# Patient Record
Sex: Female | Born: 1955 | Hispanic: No | Marital: Single | Smoking: Never smoker
Health system: Southern US, Community
[De-identification: ages and names within clinical notes are randomized; demographics above are authoritative.]

## PROBLEM LIST (undated history)

## (undated) ENCOUNTER — Ambulatory Visit (HOSPITAL_COMMUNITY): Admission: EM | Payer: Medicare Other

## (undated) DIAGNOSIS — E789 Disorder of lipoprotein metabolism, unspecified: Secondary | ICD-10-CM

## (undated) DIAGNOSIS — F0280 Dementia in other diseases classified elsewhere without behavioral disturbance: Secondary | ICD-10-CM

## (undated) DIAGNOSIS — G309 Alzheimer's disease, unspecified: Secondary | ICD-10-CM

## (undated) DIAGNOSIS — M545 Low back pain, unspecified: Secondary | ICD-10-CM

## (undated) DIAGNOSIS — R609 Edema, unspecified: Secondary | ICD-10-CM

## (undated) DIAGNOSIS — M4120 Other idiopathic scoliosis, site unspecified: Secondary | ICD-10-CM

## (undated) DIAGNOSIS — R5383 Other fatigue: Secondary | ICD-10-CM

## (undated) DIAGNOSIS — E559 Vitamin D deficiency, unspecified: Secondary | ICD-10-CM

## (undated) DIAGNOSIS — R32 Unspecified urinary incontinence: Secondary | ICD-10-CM

## (undated) DIAGNOSIS — J40 Bronchitis, not specified as acute or chronic: Secondary | ICD-10-CM

## (undated) DIAGNOSIS — M542 Cervicalgia: Secondary | ICD-10-CM

## (undated) DIAGNOSIS — J45909 Unspecified asthma, uncomplicated: Secondary | ICD-10-CM

## (undated) DIAGNOSIS — R42 Dizziness and giddiness: Secondary | ICD-10-CM

## (undated) DIAGNOSIS — I8390 Asymptomatic varicose veins of unspecified lower extremity: Secondary | ICD-10-CM

## (undated) DIAGNOSIS — R5381 Other malaise: Secondary | ICD-10-CM

## (undated) DIAGNOSIS — R159 Full incontinence of feces: Secondary | ICD-10-CM

## (undated) DIAGNOSIS — K219 Gastro-esophageal reflux disease without esophagitis: Secondary | ICD-10-CM

## (undated) DIAGNOSIS — M129 Arthropathy, unspecified: Secondary | ICD-10-CM

## (undated) DIAGNOSIS — M25519 Pain in unspecified shoulder: Secondary | ICD-10-CM

## (undated) DIAGNOSIS — M5416 Radiculopathy, lumbar region: Secondary | ICD-10-CM

## (undated) DIAGNOSIS — G4733 Obstructive sleep apnea (adult) (pediatric): Secondary | ICD-10-CM

## (undated) DIAGNOSIS — J309 Allergic rhinitis, unspecified: Secondary | ICD-10-CM

## (undated) HISTORY — DX: Edema, unspecified: R60.9

## (undated) HISTORY — DX: Unspecified urinary incontinence: R32

## (undated) HISTORY — DX: Vitamin D deficiency, unspecified: E55.9

## (undated) HISTORY — PX: NECK SURGERY: SHX720

## (undated) HISTORY — DX: Other malaise: R53.81

## (undated) HISTORY — DX: Bronchitis, not specified as acute or chronic: J40

## (undated) HISTORY — DX: Unspecified asthma, uncomplicated: J45.909

## (undated) HISTORY — DX: Gastro-esophageal reflux disease without esophagitis: K21.9

## (undated) HISTORY — DX: Other fatigue: R53.83

## (undated) HISTORY — DX: Radiculopathy, lumbar region: M54.16

## (undated) HISTORY — DX: Alzheimer's disease, unspecified: G30.9

## (undated) HISTORY — DX: Dementia in other diseases classified elsewhere, unspecified severity, without behavioral disturbance, psychotic disturbance, mood disturbance, and anxiety: F02.80

## (undated) HISTORY — DX: Arthropathy, unspecified: M12.9

## (undated) HISTORY — DX: Other idiopathic scoliosis, site unspecified: M41.20

## (undated) HISTORY — DX: Disorder of lipoprotein metabolism, unspecified: E78.9

## (undated) HISTORY — DX: Asymptomatic varicose veins of unspecified lower extremity: I83.90

## (undated) HISTORY — DX: Full incontinence of feces: R15.9

## (undated) HISTORY — DX: Obstructive sleep apnea (adult) (pediatric): G47.33

## (undated) HISTORY — DX: Pain in unspecified shoulder: M25.519

## (undated) HISTORY — DX: Low back pain: M54.5

## (undated) HISTORY — DX: Low back pain, unspecified: M54.50

## (undated) HISTORY — DX: Dizziness and giddiness: R42

## (undated) HISTORY — DX: Allergic rhinitis, unspecified: J30.9

## (undated) HISTORY — DX: Cervicalgia: M54.2

---

## 2007-05-14 ENCOUNTER — Emergency Department (HOSPITAL_COMMUNITY): Admission: EM | Admit: 2007-05-14 | Discharge: 2007-05-14 | Payer: Self-pay | Admitting: Family Medicine

## 2007-06-09 ENCOUNTER — Emergency Department (HOSPITAL_COMMUNITY): Admission: EM | Admit: 2007-06-09 | Discharge: 2007-06-09 | Payer: Self-pay | Admitting: Emergency Medicine

## 2007-07-17 ENCOUNTER — Encounter: Admission: RE | Admit: 2007-07-17 | Discharge: 2007-07-17 | Payer: Self-pay | Admitting: Family Medicine

## 2007-07-28 ENCOUNTER — Ambulatory Visit: Payer: Self-pay | Admitting: Nurse Practitioner

## 2007-07-28 DIAGNOSIS — M545 Low back pain, unspecified: Secondary | ICD-10-CM | POA: Insufficient documentation

## 2007-07-28 DIAGNOSIS — I8390 Asymptomatic varicose veins of unspecified lower extremity: Secondary | ICD-10-CM | POA: Insufficient documentation

## 2007-07-28 DIAGNOSIS — N8111 Cystocele, midline: Secondary | ICD-10-CM | POA: Insufficient documentation

## 2007-07-28 DIAGNOSIS — K219 Gastro-esophageal reflux disease without esophagitis: Secondary | ICD-10-CM | POA: Insufficient documentation

## 2007-07-28 LAB — CONVERTED CEMR LAB
Bilirubin Urine: NEGATIVE
Glucose, Urine, Semiquant: NEGATIVE
Ketones, urine, test strip: NEGATIVE
Nitrite: NEGATIVE
Protein, U semiquant: NEGATIVE
Specific Gravity, Urine: >=1.03
Urobilinogen, UA: 0.2
WBC Urine, dipstick: NEGATIVE
pH: 5

## 2007-08-05 ENCOUNTER — Encounter (INDEPENDENT_AMBULATORY_CARE_PROVIDER_SITE_OTHER): Payer: Self-pay | Admitting: Nurse Practitioner

## 2007-08-13 ENCOUNTER — Encounter (INDEPENDENT_AMBULATORY_CARE_PROVIDER_SITE_OTHER): Payer: Self-pay | Admitting: Nurse Practitioner

## 2007-08-15 ENCOUNTER — Encounter (INDEPENDENT_AMBULATORY_CARE_PROVIDER_SITE_OTHER): Payer: Self-pay | Admitting: Nurse Practitioner

## 2007-08-18 ENCOUNTER — Ambulatory Visit (HOSPITAL_COMMUNITY): Admission: RE | Admit: 2007-08-18 | Discharge: 2007-08-18 | Payer: Self-pay | Admitting: Nurse Practitioner

## 2007-08-18 ENCOUNTER — Ambulatory Visit: Payer: Self-pay | Admitting: Nurse Practitioner

## 2007-08-18 DIAGNOSIS — M4120 Other idiopathic scoliosis, site unspecified: Secondary | ICD-10-CM | POA: Insufficient documentation

## 2007-08-18 DIAGNOSIS — N951 Menopausal and female climacteric states: Secondary | ICD-10-CM | POA: Insufficient documentation

## 2007-08-18 LAB — CONVERTED CEMR LAB
ALT: 17 units/L (ref 0–35)
AST: 16 units/L (ref 0–37)
Albumin: 4.2 g/dL (ref 3.5–5.2)
Alkaline Phosphatase: 58 units/L (ref 39–117)
BUN: 11 mg/dL (ref 6–23)
Basophils Absolute: 0 10*3/uL (ref 0.0–0.1)
Basophils Relative: 1 % (ref 0–1)
Bilirubin Urine: NEGATIVE
CO2: 28 meq/L (ref 19–32)
Calcium: 9.6 mg/dL (ref 8.4–10.5)
Chloride: 102 meq/L (ref 96–112)
Creatinine, Ser: 0.58 mg/dL (ref 0.40–1.20)
Eosinophils Absolute: 0.1 10*3/uL (ref 0.0–0.7)
Eosinophils Relative: 3 % (ref 0–5)
Glucose, Bld: 76 mg/dL (ref 70–99)
Glucose, Urine, Semiquant: NEGATIVE
HCT: 39.2 % (ref 36.0–46.0)
Hemoglobin: 12.8 g/dL (ref 12.0–15.0)
Ketones, urine, test strip: NEGATIVE
Lymphocytes Relative: 39 % (ref 12–46)
Lymphs Abs: 1.8 10*3/uL (ref 0.7–4.0)
MCHC: 32.7 g/dL (ref 30.0–36.0)
MCV: 89.3 fL (ref 78.0–100.0)
Monocytes Absolute: 0.4 10*3/uL (ref 0.1–1.0)
Monocytes Relative: 9 % (ref 3–12)
Neutro Abs: 2.2 10*3/uL (ref 1.7–7.7)
Neutrophils Relative %: 48 % (ref 43–77)
Nitrite: NEGATIVE
Platelets: 282 10*3/uL (ref 150–400)
Potassium: 4.3 meq/L (ref 3.5–5.3)
Protein, U semiquant: NEGATIVE
RBC: 4.39 M/uL (ref 3.87–5.11)
RDW: 13.3 % (ref 11.5–15.5)
Sodium: 141 meq/L (ref 135–145)
Specific Gravity, Urine: 1.02
TSH: 2.188 microintl units/mL (ref 0.350–4.50)
Total Bilirubin: 0.3 mg/dL (ref 0.3–1.2)
Total Protein: 7.4 g/dL (ref 6.0–8.3)
Urobilinogen, UA: 0.2
WBC Urine, dipstick: NEGATIVE
WBC: 4.6 10*3/uL (ref 4.0–10.5)
pH: 5.5

## 2007-08-19 ENCOUNTER — Encounter (INDEPENDENT_AMBULATORY_CARE_PROVIDER_SITE_OTHER): Payer: Self-pay | Admitting: Nurse Practitioner

## 2007-08-21 ENCOUNTER — Ambulatory Visit (HOSPITAL_COMMUNITY): Admission: RE | Admit: 2007-08-21 | Discharge: 2007-08-21 | Payer: Self-pay | Admitting: Family Medicine

## 2007-09-01 ENCOUNTER — Ambulatory Visit: Payer: Self-pay | Admitting: Nurse Practitioner

## 2007-09-01 DIAGNOSIS — M129 Arthropathy, unspecified: Secondary | ICD-10-CM | POA: Insufficient documentation

## 2007-09-09 ENCOUNTER — Encounter (INDEPENDENT_AMBULATORY_CARE_PROVIDER_SITE_OTHER): Payer: Self-pay | Admitting: Nurse Practitioner

## 2007-09-16 ENCOUNTER — Encounter (INDEPENDENT_AMBULATORY_CARE_PROVIDER_SITE_OTHER): Payer: Self-pay | Admitting: Nurse Practitioner

## 2007-09-16 ENCOUNTER — Ambulatory Visit (HOSPITAL_COMMUNITY): Admission: RE | Admit: 2007-09-16 | Discharge: 2007-09-16 | Payer: Self-pay | Admitting: Urology

## 2007-09-23 ENCOUNTER — Telehealth (INDEPENDENT_AMBULATORY_CARE_PROVIDER_SITE_OTHER): Payer: Self-pay | Admitting: Nurse Practitioner

## 2007-09-25 ENCOUNTER — Encounter (INDEPENDENT_AMBULATORY_CARE_PROVIDER_SITE_OTHER): Payer: Self-pay | Admitting: Nurse Practitioner

## 2007-09-25 ENCOUNTER — Encounter: Admission: RE | Admit: 2007-09-25 | Discharge: 2007-10-30 | Payer: Self-pay | Admitting: Nurse Practitioner

## 2007-10-01 ENCOUNTER — Ambulatory Visit: Payer: Self-pay | Admitting: Nurse Practitioner

## 2007-10-01 DIAGNOSIS — J45909 Unspecified asthma, uncomplicated: Secondary | ICD-10-CM | POA: Insufficient documentation

## 2007-10-01 DIAGNOSIS — R109 Unspecified abdominal pain: Secondary | ICD-10-CM | POA: Insufficient documentation

## 2007-10-02 ENCOUNTER — Telehealth (INDEPENDENT_AMBULATORY_CARE_PROVIDER_SITE_OTHER): Payer: Self-pay | Admitting: Nurse Practitioner

## 2007-10-09 ENCOUNTER — Encounter (INDEPENDENT_AMBULATORY_CARE_PROVIDER_SITE_OTHER): Payer: Self-pay | Admitting: Nurse Practitioner

## 2007-10-09 ENCOUNTER — Ambulatory Visit (HOSPITAL_COMMUNITY): Admission: RE | Admit: 2007-10-09 | Discharge: 2007-10-09 | Payer: Self-pay | Admitting: Internal Medicine

## 2007-10-13 ENCOUNTER — Telehealth (INDEPENDENT_AMBULATORY_CARE_PROVIDER_SITE_OTHER): Payer: Self-pay | Admitting: Nurse Practitioner

## 2007-10-14 ENCOUNTER — Encounter (INDEPENDENT_AMBULATORY_CARE_PROVIDER_SITE_OTHER): Payer: Self-pay | Admitting: Nurse Practitioner

## 2007-10-27 ENCOUNTER — Ambulatory Visit: Payer: Self-pay | Admitting: Nurse Practitioner

## 2007-10-27 DIAGNOSIS — M542 Cervicalgia: Secondary | ICD-10-CM | POA: Insufficient documentation

## 2007-10-28 ENCOUNTER — Telehealth (INDEPENDENT_AMBULATORY_CARE_PROVIDER_SITE_OTHER): Payer: Self-pay | Admitting: Nurse Practitioner

## 2007-11-04 ENCOUNTER — Encounter (INDEPENDENT_AMBULATORY_CARE_PROVIDER_SITE_OTHER): Payer: Self-pay | Admitting: Nurse Practitioner

## 2008-04-01 ENCOUNTER — Encounter: Admission: RE | Admit: 2008-04-01 | Discharge: 2008-04-01 | Payer: Self-pay | Admitting: Pulmonary Disease

## 2008-04-09 ENCOUNTER — Telehealth (INDEPENDENT_AMBULATORY_CARE_PROVIDER_SITE_OTHER): Payer: Self-pay | Admitting: Nurse Practitioner

## 2008-04-12 ENCOUNTER — Encounter (INDEPENDENT_AMBULATORY_CARE_PROVIDER_SITE_OTHER): Payer: Self-pay | Admitting: Nurse Practitioner

## 2008-04-22 ENCOUNTER — Ambulatory Visit: Payer: Self-pay | Admitting: Nurse Practitioner

## 2008-04-22 DIAGNOSIS — M25519 Pain in unspecified shoulder: Secondary | ICD-10-CM | POA: Insufficient documentation

## 2008-04-22 LAB — CONVERTED CEMR LAB
Basophils Absolute: 0 10*3/uL (ref 0.0–0.1)
Basophils Relative: 0 % (ref 0–1)
Eosinophils Absolute: 0.1 10*3/uL (ref 0.0–0.7)
Eosinophils Relative: 1 % (ref 0–5)
HCT: 39.4 % (ref 36.0–46.0)
Hemoglobin: 12.9 g/dL (ref 12.0–15.0)
Lymphocytes Relative: 44 % (ref 12–46)
Lymphs Abs: 3.3 10*3/uL (ref 0.7–4.0)
MCHC: 32.7 g/dL (ref 30.0–36.0)
MCV: 87.4 fL (ref 78.0–100.0)
Monocytes Absolute: 0.7 10*3/uL (ref 0.1–1.0)
Monocytes Relative: 9 % (ref 3–12)
Neutro Abs: 3.3 10*3/uL (ref 1.7–7.7)
Neutrophils Relative %: 45 % (ref 43–77)
Platelets: 280 10*3/uL (ref 150–400)
RBC: 4.51 M/uL (ref 3.87–5.11)
RDW: 13.3 % (ref 11.5–15.5)
WBC: 7.4 10*3/uL (ref 4.0–10.5)

## 2008-04-26 ENCOUNTER — Ambulatory Visit (HOSPITAL_COMMUNITY): Admission: RE | Admit: 2008-04-26 | Discharge: 2008-04-26 | Payer: Self-pay | Admitting: Nurse Practitioner

## 2008-04-28 ENCOUNTER — Encounter (INDEPENDENT_AMBULATORY_CARE_PROVIDER_SITE_OTHER): Payer: Self-pay | Admitting: Nurse Practitioner

## 2008-05-03 ENCOUNTER — Telehealth (INDEPENDENT_AMBULATORY_CARE_PROVIDER_SITE_OTHER): Payer: Self-pay | Admitting: Nurse Practitioner

## 2008-05-10 ENCOUNTER — Ambulatory Visit: Payer: Self-pay | Admitting: Nurse Practitioner

## 2008-05-10 DIAGNOSIS — J309 Allergic rhinitis, unspecified: Secondary | ICD-10-CM | POA: Insufficient documentation

## 2008-06-23 ENCOUNTER — Ambulatory Visit: Payer: Self-pay | Admitting: Nurse Practitioner

## 2008-06-23 DIAGNOSIS — R609 Edema, unspecified: Secondary | ICD-10-CM | POA: Insufficient documentation

## 2008-06-23 DIAGNOSIS — R5383 Other fatigue: Secondary | ICD-10-CM

## 2008-06-23 DIAGNOSIS — R5381 Other malaise: Secondary | ICD-10-CM | POA: Insufficient documentation

## 2008-06-23 LAB — CONVERTED CEMR LAB
Blood Glucose, Fingerstick: 85
Hgb A1c MFr Bld: 85 %

## 2008-06-24 ENCOUNTER — Encounter (INDEPENDENT_AMBULATORY_CARE_PROVIDER_SITE_OTHER): Payer: Self-pay | Admitting: Nurse Practitioner

## 2008-06-25 DIAGNOSIS — E559 Vitamin D deficiency, unspecified: Secondary | ICD-10-CM | POA: Insufficient documentation

## 2008-06-25 LAB — CONVERTED CEMR LAB
BUN: 20 mg/dL (ref 6–23)
CO2: 26 meq/L (ref 19–32)
Calcium: 9.4 mg/dL (ref 8.4–10.5)
Chloride: 109 meq/L (ref 96–112)
Creatinine, Ser: 0.56 mg/dL (ref 0.40–1.20)
Eosinophils Absolute: 0.1 10*3/uL (ref 0.0–0.7)
Eosinophils Relative: 2 % (ref 0–5)
HCT: 39.2 % (ref 36.0–46.0)
Lymphs Abs: 2 10*3/uL (ref 0.7–4.0)
MCV: 91.2 fL (ref 78.0–100.0)
Monocytes Relative: 9 % (ref 3–12)
Platelets: 268 10*3/uL (ref 150–400)
TSH: 1.864 microintl units/mL (ref 0.350–4.500)
WBC: 6.6 10*3/uL (ref 4.0–10.5)

## 2008-07-08 ENCOUNTER — Encounter (INDEPENDENT_AMBULATORY_CARE_PROVIDER_SITE_OTHER): Payer: Self-pay | Admitting: Nurse Practitioner

## 2008-07-28 ENCOUNTER — Encounter (INDEPENDENT_AMBULATORY_CARE_PROVIDER_SITE_OTHER): Payer: Self-pay | Admitting: Nurse Practitioner

## 2008-07-28 ENCOUNTER — Ambulatory Visit (HOSPITAL_BASED_OUTPATIENT_CLINIC_OR_DEPARTMENT_OTHER): Admission: RE | Admit: 2008-07-28 | Discharge: 2008-07-28 | Payer: Self-pay | Admitting: Nurse Practitioner

## 2008-07-28 DIAGNOSIS — G4733 Obstructive sleep apnea (adult) (pediatric): Secondary | ICD-10-CM | POA: Insufficient documentation

## 2008-08-04 ENCOUNTER — Ambulatory Visit: Payer: Self-pay | Admitting: Pulmonary Disease

## 2008-08-12 ENCOUNTER — Encounter (INDEPENDENT_AMBULATORY_CARE_PROVIDER_SITE_OTHER): Payer: Self-pay | Admitting: Nurse Practitioner

## 2008-08-23 ENCOUNTER — Encounter: Admission: RE | Admit: 2008-08-23 | Discharge: 2008-08-23 | Payer: Self-pay | Admitting: Internal Medicine

## 2008-08-25 ENCOUNTER — Encounter (INDEPENDENT_AMBULATORY_CARE_PROVIDER_SITE_OTHER): Payer: Self-pay | Admitting: Nurse Practitioner

## 2008-08-26 ENCOUNTER — Encounter: Admission: RE | Admit: 2008-08-26 | Discharge: 2008-08-26 | Payer: Self-pay | Admitting: Internal Medicine

## 2008-10-07 ENCOUNTER — Ambulatory Visit: Payer: Self-pay | Admitting: Nurse Practitioner

## 2008-10-07 LAB — CONVERTED CEMR LAB

## 2008-10-18 ENCOUNTER — Ambulatory Visit: Payer: Self-pay | Admitting: Nurse Practitioner

## 2008-10-19 LAB — CONVERTED CEMR LAB: Vit D, 25-Hydroxy: 22 ng/mL — ABNORMAL LOW (ref 30–89)

## 2008-10-29 ENCOUNTER — Ambulatory Visit: Payer: Self-pay | Admitting: Nurse Practitioner

## 2008-11-03 ENCOUNTER — Ambulatory Visit: Payer: Self-pay | Admitting: Pulmonary Disease

## 2008-11-03 DIAGNOSIS — R0600 Dyspnea, unspecified: Secondary | ICD-10-CM | POA: Insufficient documentation

## 2008-11-03 DIAGNOSIS — R0609 Other forms of dyspnea: Secondary | ICD-10-CM | POA: Insufficient documentation

## 2008-11-08 ENCOUNTER — Ambulatory Visit: Payer: Self-pay | Admitting: Pulmonary Disease

## 2008-11-10 ENCOUNTER — Telehealth (INDEPENDENT_AMBULATORY_CARE_PROVIDER_SITE_OTHER): Payer: Self-pay | Admitting: Nurse Practitioner

## 2008-11-10 ENCOUNTER — Encounter: Admission: RE | Admit: 2008-11-10 | Discharge: 2008-12-16 | Payer: Self-pay | Admitting: Nurse Practitioner

## 2008-11-10 ENCOUNTER — Encounter (INDEPENDENT_AMBULATORY_CARE_PROVIDER_SITE_OTHER): Payer: Self-pay | Admitting: Nurse Practitioner

## 2008-12-07 ENCOUNTER — Encounter (INDEPENDENT_AMBULATORY_CARE_PROVIDER_SITE_OTHER): Payer: Self-pay | Admitting: Nurse Practitioner

## 2008-12-08 ENCOUNTER — Encounter (INDEPENDENT_AMBULATORY_CARE_PROVIDER_SITE_OTHER): Payer: Self-pay | Admitting: Nurse Practitioner

## 2008-12-13 ENCOUNTER — Encounter (INDEPENDENT_AMBULATORY_CARE_PROVIDER_SITE_OTHER): Payer: Self-pay | Admitting: Nurse Practitioner

## 2008-12-20 ENCOUNTER — Ambulatory Visit: Payer: Self-pay | Admitting: Nurse Practitioner

## 2008-12-20 DIAGNOSIS — J329 Chronic sinusitis, unspecified: Secondary | ICD-10-CM | POA: Insufficient documentation

## 2008-12-21 ENCOUNTER — Encounter (INDEPENDENT_AMBULATORY_CARE_PROVIDER_SITE_OTHER): Payer: Self-pay | Admitting: Nurse Practitioner

## 2008-12-31 ENCOUNTER — Encounter (INDEPENDENT_AMBULATORY_CARE_PROVIDER_SITE_OTHER): Payer: Self-pay | Admitting: Nurse Practitioner

## 2009-01-14 ENCOUNTER — Encounter (INDEPENDENT_AMBULATORY_CARE_PROVIDER_SITE_OTHER): Payer: Self-pay | Admitting: Nurse Practitioner

## 2009-01-25 ENCOUNTER — Encounter (INDEPENDENT_AMBULATORY_CARE_PROVIDER_SITE_OTHER): Payer: Self-pay | Admitting: Nurse Practitioner

## 2009-02-01 ENCOUNTER — Encounter (INDEPENDENT_AMBULATORY_CARE_PROVIDER_SITE_OTHER): Payer: Self-pay | Admitting: Nurse Practitioner

## 2009-02-01 ENCOUNTER — Encounter: Admission: RE | Admit: 2009-02-01 | Discharge: 2009-02-01 | Payer: Self-pay | Admitting: Internal Medicine

## 2009-02-03 ENCOUNTER — Ambulatory Visit: Payer: Self-pay | Admitting: Nurse Practitioner

## 2009-02-03 DIAGNOSIS — H1045 Other chronic allergic conjunctivitis: Secondary | ICD-10-CM | POA: Insufficient documentation

## 2009-02-24 ENCOUNTER — Ambulatory Visit: Payer: Self-pay | Admitting: Nurse Practitioner

## 2009-03-17 ENCOUNTER — Ambulatory Visit: Payer: Self-pay | Admitting: Nurse Practitioner

## 2009-03-17 LAB — CONVERTED CEMR LAB
Nitrite: NEGATIVE
Specific Gravity, Urine: 1.025
pH: 5.5

## 2009-03-18 ENCOUNTER — Telehealth (INDEPENDENT_AMBULATORY_CARE_PROVIDER_SITE_OTHER): Payer: Self-pay | Admitting: Nurse Practitioner

## 2009-03-22 ENCOUNTER — Encounter (INDEPENDENT_AMBULATORY_CARE_PROVIDER_SITE_OTHER): Payer: Self-pay | Admitting: Nurse Practitioner

## 2009-04-14 ENCOUNTER — Encounter (INDEPENDENT_AMBULATORY_CARE_PROVIDER_SITE_OTHER): Payer: Self-pay | Admitting: Nurse Practitioner

## 2009-04-28 ENCOUNTER — Encounter (INDEPENDENT_AMBULATORY_CARE_PROVIDER_SITE_OTHER): Payer: Self-pay | Admitting: Nurse Practitioner

## 2009-05-06 ENCOUNTER — Telehealth (INDEPENDENT_AMBULATORY_CARE_PROVIDER_SITE_OTHER): Payer: Self-pay | Admitting: Nurse Practitioner

## 2009-05-06 ENCOUNTER — Ambulatory Visit: Payer: Self-pay | Admitting: Nurse Practitioner

## 2009-05-06 DIAGNOSIS — R42 Dizziness and giddiness: Secondary | ICD-10-CM | POA: Insufficient documentation

## 2009-06-20 ENCOUNTER — Encounter (INDEPENDENT_AMBULATORY_CARE_PROVIDER_SITE_OTHER): Payer: Self-pay | Admitting: Nurse Practitioner

## 2009-07-28 ENCOUNTER — Encounter (INDEPENDENT_AMBULATORY_CARE_PROVIDER_SITE_OTHER): Payer: Self-pay | Admitting: Nurse Practitioner

## 2009-08-16 ENCOUNTER — Encounter: Admission: RE | Admit: 2009-08-16 | Discharge: 2009-08-16 | Payer: Self-pay | Admitting: Neurology

## 2009-08-18 ENCOUNTER — Encounter (INDEPENDENT_AMBULATORY_CARE_PROVIDER_SITE_OTHER): Payer: Self-pay | Admitting: Nurse Practitioner

## 2009-08-23 ENCOUNTER — Encounter: Admission: RE | Admit: 2009-08-23 | Discharge: 2009-08-23 | Payer: Self-pay | Admitting: Internal Medicine

## 2009-09-23 ENCOUNTER — Encounter (INDEPENDENT_AMBULATORY_CARE_PROVIDER_SITE_OTHER): Payer: Self-pay | Admitting: Nurse Practitioner

## 2009-09-28 ENCOUNTER — Encounter: Admission: RE | Admit: 2009-09-28 | Discharge: 2009-11-09 | Payer: Self-pay | Admitting: Orthopedic Surgery

## 2009-11-07 ENCOUNTER — Encounter: Admission: RE | Admit: 2009-11-07 | Discharge: 2009-11-07 | Payer: Self-pay | Admitting: Neurology

## 2009-12-09 ENCOUNTER — Encounter (INDEPENDENT_AMBULATORY_CARE_PROVIDER_SITE_OTHER): Payer: Self-pay | Admitting: Nurse Practitioner

## 2009-12-16 ENCOUNTER — Encounter: Admission: RE | Admit: 2009-12-16 | Discharge: 2009-12-16 | Payer: Self-pay | Admitting: Neurology

## 2010-02-20 ENCOUNTER — Encounter: Payer: Self-pay | Admitting: Family Medicine

## 2010-02-20 ENCOUNTER — Encounter: Payer: Self-pay | Admitting: Urology

## 2010-02-28 NOTE — Assessment & Plan Note (Signed)
Summary: F/u from allergist   Vital Signs:  Patient profile:   55 year old female Menstrual status:  postmenopausal Weight:      160.6 pounds BMI:     26.82 BSA:     1.80 Pulse rhythm:   regular BP sitting:   110 / 70  (left arm) Cuff size:   regular  Vitals Entered By: Levon Hedger (May 06, 2009 10:37 AM) CC: pt has a cough that is still there and pt was to follow-up after 6 weeks...pain in upper back that radiates to neck and shoulders, Abdominal Pain Is Patient Diabetic? No Pain Assessment Patient in pain? yes     Location: neck, upper back , head Intensity: 8  Does patient need assistance? Functional Status Self care Ambulation Normal   Primary Care Provider:  Lehman Prom FNP  CC:  pt has a cough that is still there and pt was to follow-up after 6 weeks...pain in upper back that radiates to neck and shoulders and Abdominal Pain.  History of Present Illness:  Pt into the office for follow up - long history of allergic conjunctivitis and bronchitis She was seen by the allergist as referral by this office on initially on March 22, 2009 with next visit on March 17th, 2011 (initial visit reviewed by this office) Scratch tests reveal negative findings. Foods were unremarkable.  Intradermal skin tests were entirely negative. no mold antigens Pt received Depo-Medrol 80mg  IM to alleviate the acute inflammation with deltasone 20mg  QAM x 7 days then every other day x 7 pills (she has finished the meds) Zyrtec QPM Astelin Pataday  Pt was to f/u in 2-3 months to see if she had amelioration of her symptoms **Pt has all medications present with her today** Current complaint is dizziness - she feels like her head is moving.  More pronouncd when going from sitting to standing position. Pt presents with Rx today from Allergist indicating that pt may need a neurology evaluation for c/o vertigo. Previous testing by pulmonologist was normal  Sleep study normal  Dyspepsia  History:      There is a prior history of GERD.  The patient does not have a prior history of documented ulcer disease.     Allergies (verified): No Known Drug Allergies  Review of Systems General:  +Dizziness. Resp:  Denies cough; cough starts when she gets cold and pain in upper shoulder starts. GI:  Denies nausea and vomiting. MS:  Complains of low back pain.  Physical Exam  General:  alert.   Head:  normocephalic.   Msk:  normal ROM.   Neurologic:  alert & oriented X3.   Skin:  color normal.   Psych:  Oriented X3.     Impression & Recommendations:  Problem # 1:  VERTIGO (ICD-780.4)  will refer to neurology Her updated medication list for this problem includes:    Cetirizine Hcl 10 Mg Tabs (Cetirizine hcl) ..... One tablet by mouth nightly for breathing  Orders: Neurology Referral (Neuro)  Problem # 2:  ALLERGIC RHINITIS (ICD-477.9) Pt has been to the allergist and has been started on some medications allergy testing is negative Her updated medication list for this problem includes:    Cetirizine Hcl 10 Mg Tabs (Cetirizine hcl) ..... One tablet by mouth nightly for breathing  Problem # 3:  GERD (ICD-530.81) will refill the nexium Her updated medication list for this problem includes:    Nexium 40 Mg Cpdr (Esomeprazole magnesium) ..... One tablet by mouth daily before  breakfast  Complete Medication List: 1)  Nexium 40 Mg Cpdr (Esomeprazole magnesium) .... One tablet by mouth daily before breakfast 2)  Lumbar Support Cushion Misc (Misc. devices) .... Need lumbar support dx - back pain (724.2)and scoliosis (737.30)  size to fit 3)  Ultram 50 Mg Tabs (Tramadol hcl) .... One tablet by mouth daily as needed for pain 4)  Citalopram Hydrobromide 20 Mg Tabs (Citalopram hydrobromide) .... One tablet by mouth daily 5)  Diclofenac Sodium 75 Mg Tbec (Diclofenac sodium) .... One tablet by mouth two times a day as needed 6)  Hydrochlorothiazide 25 Mg Tabs (Hydrochlorothiazide)  .... Take one (1) by mouth daily as needed for swelling 7)  Voltaren 1 % Gel (Diclofenac sodium) .... Apply to affected area up to four times per day 8)  Cetirizine Hcl 10 Mg Tabs (Cetirizine hcl) .... One tablet by mouth nightly for breathing 9)  Hydroxyzine Hcl 10 Mg Tabs (Hydroxyzine hcl) .... One tablet by mouth four times per day  Dyspepsia Assessment/Plan:  Step Therapy: GERD Treatment Protocols:    Step-1: started  Patient Instructions: 1)  This office has referred you to Neurology.  You will be contacted with the date/time of the appointment. 2)  Continue all your medications as ordered 3)  Voltaren Ointment - I have sent this prescription to the pharmacy.  Contact them to see when you can get it 4)  Follow up as needed Prescriptions: VOLTAREN 1 % GEL (DICLOFENAC SODIUM) Apply to affected area up to four times per day  #30mg  x 1   Entered and Authorized by:   Lehman Prom FNP   Signed by:   Lehman Prom FNP on 05/06/2009   Method used:   Electronically to        [facility] (retail)       [address]       [place], [place]  [id number]       Ph: [phone]       Fax: [phone]   RxID:   [id number] NEXIUM 40 MG CPDR (ESOMEPRAZOLE MAGNESIUM) One tablet by mouth daily before breakfast  #30 x 5   Entered and Authorized by:   Lehman Prom FNP   Signed by:   Lehman Prom FNP on 05/06/2009   Method used:   Electronically to        [facility] [address] (retail)       [address]       [place], [place]  [id number]       Ph: [phone]       Fax: [phone]   RxID:   [id number]

## 2010-02-28 NOTE — Progress Notes (Signed)
Summary: [facility] referral update  Phone Note From Other Clinic   Caller: Referral Coordinator Summary of Call: Pt has an appt @ [facility] on 03-29-09 @ 1:45pm with Dr.La Harpe.Lt message for Pt to return my call.Pt aware of appt. Initial call taken by: Candi Leash,  March 18, 2009 2:52 PM  Follow-up for Phone Call        noted Follow-up by: Lehman Prom FNP,  March 21, 2009 7:10 AM

## 2010-02-28 NOTE — Letter (Signed)
Summary: *Referral Letter  [facility]  [address] [place], [place] [address]   Phone: [phone]  Fax: [phone]    02/03/2009  Thank you in advance for agreeing to see my patient:  Kayla Davies [address] [place], [place]  [address]  Phone: [phone]  Reason for Referral: Allergy clinic  Procedures Requested: Allergy testing  Current Medical Problems: 1)  CONJUNCTIVITIS, ALLERGIC (ICD-372.14) 2)  SINUSITIS (ICD-473.9) 3)  DYSPNEA ON EXERTION (ICD-786.09) 4)  NEED PROPHYLACTIC VACCINATION&INOCULATION FLU (ICD-V04.81) 5)  SLEEP APNEA, OBSTRUCTIVE, MILD (ICD-327.23) 6)  VITAMIN D DEFICIENCY (ICD-268.9) 7)  EDEMA (ICD-782.3) 8)  FATIGUE (ICD-780.79) 9)  ALLERGIC RHINITIS (ICD-477.9) 10)  SHOULDER PAIN, RIGHT (ICD-719.41) 11)  NECK PAIN (ICD-723.1) 12)  REACTIVE AIRWAY DISEASE (ICD-493.90) 13)  PELVIC  PAIN (ICD-789.09) 14)  SPECIAL SCREENING FOR MALIGNANT NEOPLASMS COLON (ICD-V76.51) 15)  ARTHRITIS, KNEES, 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 DEVICES) need lumbar support Dx - back pain (724.2)and scoliosis (737.30)  size to fit 3)  ULTRAM 50 MG TABS (TRAMADOL HCL) One tablet by mouth daily as needed for pain 4)  ERGOCALCIFEROL 50000 UNIT CAPS (ERGOCALCIFEROL) 1 capsule by mouth WEEKLY 5)  CITALOPRAM HYDROBROMIDE 20 MG TABS (CITALOPRAM HYDROBROMIDE) One tablet by mouth daily 6)  DICLOFENAC SODIUM 75 MG TBEC (DICLOFENAC SODIUM) One tablet  by mouth two times a day 7)  HYDROCHLOROTHIAZIDE 25 MG TABS (HYDROCHLOROTHIAZIDE) Take one (1) by mouth daily 8)  VOLTAREN 1 % GEL (DICLOFENAC SODIUM) Apply to affected area up to four times per day 9)  CERON-DM 12.06-01-13 MG/5ML SYRP (PHENYLEPHRINE-CHLORPHEN-DM) One teaspoon every 6 hours as needed for cough/congestion 10)  FLUTICASONE PROPIONATE 50 MCG/ACT SUSP (FLUTICASONE PROPIONATE) One spray in each nostril two times a day  **hold head down**   Thank you again for agreeing to see our patient; please contact us if you have any further questions or need additional information.  Sincerely,    Lehman Prom FNP [facility]

## 2010-02-28 NOTE — Letter (Signed)
Summary: APPT CONFIRMATION/PT AWARE  APPT CONFIRMATION/PT AWARE   Imported By: Arta Bruce 07/14/2009 12:15:05  _____________________________________________________________________  External Attachment:    Type:   Image     Comment:   External Document

## 2010-02-28 NOTE — Assessment & Plan Note (Signed)
Summary: GERD/Cough   Vital Signs:  Patient profile:   55 year old female Menstrual status:  postmenopausal Height:      65 inches Weight:      160.9 pounds Temp:     98.7 degrees F oral Pulse rate:   74 / minute Pulse rhythm:   regular Resp:     16 per minute BP sitting:   122 / 78  (left arm) Cuff size:   regular  Vitals Entered By: Michelle Nasuti (March 17, 2009 10:24 AM) CC: pt c/o vomitting 5 x /day..chronic cough, Abdominal Pain, Back Pain Is Patient Diabetic? No Pain Assessment Patient in pain? yes     Location: throat Intensity: 9  Does patient need assistance? Ambulation Normal Comments pt did bring med bottles. she is only taking otc vit d   Primary Care Provider:  Lehman Prom FNP  CC:  pt c/o vomitting 5 x /day..chronic cough, Abdominal Pain, and Back Pain.  History of Present Illness:  Pt into the office with complaints of vomiting that is exacerbated by her cough.  Cough has been an ongoing issue for the pt. She has been to pulmonary for f/u and testing was normal. She also had a sleep study which was normal back in 05/2009. Cough is persistent and the cough makes her vomiting and she is actually afraid to eat for fear that it will make her vomit Pulmonary advised that pt see a allergist however she has not been referred yet.  Back Pain History:            Other comments:  Reports daily back pain however she is no longer taking the back pain.    Dyspepsia History:      There is a prior history of GERD.  The patient does not have a prior history of documented ulcer disease.  The dominant symptom is heartburn or acid reflux.  An H-2 blocker medication is not currently being taken.  She has no history of a positive H. Pylori serology.  No previous upper endoscopy has been done.      Allergies: No Known Drug Allergies  Review of Systems CV:  Denies chest pain or discomfort. Resp:  Denies cough. GI:  Denies abdominal pain, nausea, and  vomiting.  Physical Exam  General:  alert.   Head:  normocephalic.   Msk:  normal ROM.   Neurologic:  alert & oriented X3.   Psych:  Oriented X3.     Impression & Recommendations:  Problem # 1:  GERD (ICD-530.81) perhaps cough is due to GERD advised pt to take nexium for at least 6 weeks to see that will help cough If not will need EGD Her updated medication list for this problem includes:    Nexium 40 Mg Cpdr (Esomeprazole magnesium) ..... One tablet by mouth daily before breakfast  Problem # 2:  ALLERGIC RHINITIS (ICD-477.9) Will start cetirizine 10mg  nightly The following medications were removed from the medication list:    Fluticasone Propionate 50 Mcg/act Susp (Fluticasone propionate) ..... One spray in each nostril two times a day  **hold head down** Her updated medication list for this problem includes:    Cetirizine Hcl 10 Mg Tabs (Cetirizine hcl) ..... One tablet by mouth nightly for breathing  Complete Medication List: 1)  Nexium 40 Mg Cpdr (Esomeprazole magnesium) .... One tablet by mouth daily before breakfast 2)  Lumbar Support Cushion Misc (Misc. devices) .... Need lumbar support dx - back pain (724.2)and scoliosis (737.30)  size to  fit 3)  Ultram 50 Mg Tabs (Tramadol hcl) .... One tablet by mouth daily as needed for pain 4)  Vitamin D 1000 Unit Caps (Cholecalciferol) .... One capsule by mouth daily 5)  Citalopram Hydrobromide 20 Mg Tabs (Citalopram hydrobromide) .... One tablet by mouth daily 6)  Diclofenac Sodium 75 Mg Tbec (Diclofenac sodium) .... One tablet by mouth two times a day 7)  Hydrochlorothiazide 25 Mg Tabs (Hydrochlorothiazide) .... Take one (1) by mouth daily 8)  Voltaren 1 % Gel (Diclofenac sodium) .... Apply to affected area up to four times per day 9)  Cetirizine Hcl 10 Mg Tabs (Cetirizine hcl) .... One tablet by mouth nightly for breathing  Dyspepsia Assessment/Plan:  Step Therapy: GERD Treatment Protocols:    Step-1: started  Patient  Instructions: 1)  Stomach pain/vomiting - take Nexium 40mg  by mouth 30 minutes before breakfast. 2)  Avoid fried, spicy foods. 3)  Sit up at least 30-45 minutes after eating 4)  Breathing - take ceritizine 10mg  by mouth nightly for breathing 5)  Follow up as needed 6)  If cough continues will need EGD Prescriptions: CETIRIZINE HCL 10 MG TABS (CETIRIZINE HCL) One tablet by mouth nightly for breathing  #30 x 5   Entered and Authorized by:   Lehman Prom FNP   Signed by:   Lehman Prom FNP on 03/17/2009   Method used:   Print then Give to Patient   RxID:   [id number] NEXIUM 40 MG CPDR (ESOMEPRAZOLE MAGNESIUM) One tablet by mouth daily before breakfast  #30 x 5   Entered and Authorized by:   Lehman Prom FNP   Signed by:   Lehman Prom FNP on 03/17/2009   Method used:   Print then Give to Patient   RxID:   [id number]   Laboratory Results   Urine Tests  Date/Time Received: March 17, 2009 10:37 AM   Routine Urinalysis   Color: yellow Appearance: Clear Glucose: negative   (Normal Range: Negative) Bilirubin: negative   (Normal Range: Negative) Ketone: trace (5)   (Normal Range: Negative) Spec. Gravity: 1.025   (Normal Range: 1.003-1.035) Blood: moderate   (Normal Range: Negative) pH: 5.5   (Normal Range: 5.0-8.0) Protein: trace   (Normal Range: Negative) Urobilinogen: 0.2   (Normal Range: 0-1) Nitrite: negative   (Normal Range: Negative) Leukocyte Esterace: negative   (Normal Range: Negative)

## 2010-02-28 NOTE — Letter (Signed)
Summary: [facility]//MEDICAL MODALITIES  [facility]MEDICAL [facility]   Imported By: Arta Bruce 02/23/2009 11:50:59  _____________________________________________________________________  External Attachment:    Type:   Image     Comment:   External Document

## 2010-02-28 NOTE — Letter (Signed)
Summary: MED/[facility]UTIONS/APPROVED  MED/SOLUTIONS/APPROVED   Imported By: Arta Bruce 08/19/2009 11:52:36  _____________________________________________________________________  External Attachment:    Type:   Image     Comment:   External Document

## 2010-02-28 NOTE — Letter (Signed)
Summary: [facility]  [facility] /[place]   Imported ByArta Bruce 08/03/2009 14:42:59  _____________________________________________________________________  External Attachment:    Type:   Image     Comment:   External Document

## 2010-02-28 NOTE — Assessment & Plan Note (Signed)
Summary: Acute - Allergic Rhinitis   Vital Signs:  Patient profile:   55 year old female Menstrual status:  postmenopausal Weight:      161.3 pounds BMI:     26.94 BSA:     1.81 Temp:     97.9 degrees F oral Pulse rate:   71 / minute Pulse rhythm:   regular Resp:     16 per minute BP sitting:   137 / 90  (left arm) Cuff size:   regular  Vitals Entered By: Levon Hedger (February 03, 2009 11:51 AM) CC: headache x 3 weeks, tired, cough x 2 weeks it causes her to vomit Is Patient Diabetic? No Pain Assessment Patient in pain? yes     Location: head, womb  Does patient need assistance? Functional Status Self care Ambulation Normal   Primary Care Provider:  Lehman Prom FNP  CC:  headache x 3 weeks, tired, and cough x 2 weeks it causes her to vomit.  History of Present Illness: Pt into the office with complaints of cough. This cough is different than her usual chronic cough. Extensive workup in the past for cough which was negative. Cough now is so hard that it is making her vomiting.   Daughter present today for   Pt has all her medications for this month but she is not taking because she reports that she has to get authorization from the provider.  Current Medications (verified): 1)  Omeprazole 20 Mg Cpdr (Omeprazole) .... One Tablet By Mouth Daily For Stomach 2)  Lumbar Support Cushion   Misc (Misc. Devices) .... Need Lumbar Support Dx - Back Pain (724.2)and Scoliosis (737.30)  Size To Fit 3)  Ultram 50 Mg Tabs (Tramadol Hcl) .... One Tablet By Mouth Daily As Needed For Pain 4)  Ergocalciferol 50000 Unit Caps (Ergocalciferol) .Marland Kitchen.. 1 Capsule By Mouth Weekly 5)  Citalopram Hydrobromide 20 Mg Tabs (Citalopram Hydrobromide) .... One Tablet By Mouth Daily 6)  Diclofenac Sodium 75 Mg Tbec (Diclofenac Sodium) .... One Tablet By Mouth Two Times A Day 7)  Hydrochlorothiazide 25 Mg Tabs (Hydrochlorothiazide) .... Take One (1) By Mouth Daily 8)  Voltaren 1 % Gel  (Diclofenac Sodium) .... Apply To Affected Area Up To Four Times Per Day 9)  Ceron-Dm 12.06-01-13 Mg/35ml Syrp (Phenylephrine-Chlorphen-Dm) .... One Teaspoon Every 6 Hours As Needed For Cough/congestion 10)  Fluticasone Propionate 50 Mcg/act Susp (Fluticasone Propionate) .... One Spray in Each Nostril Two Times A Day  **hold Head Down**  Allergies (verified): No Known Drug Allergies  Review of Systems General:  Complains of sleep disorder; problems sleeping since start of current problem. CV:  Denies chest pain or discomfort. Resp:  Complains of cough. Neuro:  Complains of headaches.  Physical Exam  General:  alert.   Head:  normocephalic.   Eyes:  pupils round and pupils reactive to light.   Ears:  TM visible with clear fluid Lungs:  normal breath sounds.   Heart:  normal rate and regular rhythm.     Impression & Recommendations:  Problem # 1:  ALLERGIC RHINITIS (ICD-477.9)  Her updated medication list for this problem includes:    Fluticasone Propionate 50 Mcg/act Susp (Fluticasone propionate) ..... One spray in each nostril two times a day  **hold head down** will refer for allergy testing  Complete Medication List: 1)  Omeprazole 20 Mg Cpdr (Omeprazole) .... One tablet by mouth daily for stomach 2)  Lumbar Support Cushion Misc (Misc. devices) .... Need lumbar support dx - back  pain (724.2)and scoliosis (737.30)  size to fit 3)  Ultram 50 Mg Tabs (Tramadol hcl) .... One tablet by mouth daily as needed for pain 4)  Ergocalciferol 50000 Unit Caps (Ergocalciferol) .Marland Kitchen.. 1 capsule by mouth weekly 5)  Citalopram Hydrobromide 20 Mg Tabs (Citalopram hydrobromide) .... One tablet by mouth daily 6)  Diclofenac Sodium 75 Mg Tbec (Diclofenac sodium) .... One tablet by mouth two times a day 7)  Hydrochlorothiazide 25 Mg Tabs (Hydrochlorothiazide) .... Take one (1) by mouth daily 8)  Voltaren 1 % Gel (Diclofenac sodium) .... Apply to affected area up to four times per day 9)  Ceron-dm  12.06-01-13 Mg/47ml Syrp (Phenylephrine-chlorphen-dm) .... One teaspoon every 6 hours as needed for cough/congestion 10)  Fluticasone Propionate 50 Mcg/act Susp (Fluticasone propionate) .... One spray in each nostril two times a day  **hold head down**  Other Orders: Allergy Referral  (Allergy)  Patient Instructions: 1)  Start the cough syrup every 6 hours as needed for cough 2)  Nasal spray 2 sprays in each nostril two times a day (hold head down) 3)  Schedule lab visit in 3 weeks for vitamin D check- lab visit  Prescriptions: FLUTICASONE PROPIONATE 50 MCG/ACT SUSP (FLUTICASONE PROPIONATE) One spray in each nostril two times a day  **hold head down**  #1 x 3   Entered and Authorized by:   Lehman Prom FNP   Signed by:   Lehman Prom FNP on 02/03/2009   Method used:   Print then Give to Patient   RxID:   [id number] CERON-DM 12.06-01-13 MG/5ML SYRP (PHENYLEPHRINE-CHLORPHEN-DM) One teaspoon every 6 hours as needed for cough/congestion  #4 ounces x 0   Entered and Authorized by:   Lehman Prom FNP   Signed by:   Lehman Prom FNP on 02/03/2009   Method used:   Print then Give to Patient   RxID:   [id number]

## 2010-02-28 NOTE — Miscellaneous (Signed)
Summary: Rehab Report/[facility]CHARGE [facility]  Rehab Report/[facility]CHAR[facility]   Imported By: Arta Bruce 02/01/2009 14:40:03  _____________________________________________________________________  External Attachment:    Type:   Image     Comment:   External Document

## 2010-02-28 NOTE — Letter (Signed)
Summary: FORM FOR DURABLE MEDICAL EQUIPMENT  FORM FOR DURABLE MEDICAL EQUIPMENT   Imported By: Arta Bruce 03/08/2009 12:03:41  _____________________________________________________________________  External Attachment:    Type:   Image     Comment:   External Document

## 2010-02-28 NOTE — Progress Notes (Signed)
Summary: Neurology Referral  Phone Note Outgoing Call   Summary of Call: Pt went to the allergist as referred and now the allergist has asked that pt be referred to a neurologist Neurology referral done Initial call taken by: Lehman Prom FNP,  May 06, 2009 11:59 AM

## 2010-02-28 NOTE — Letter (Signed)
Summary: [facility]  [facility]   Imported By: Arta Bruce 12/15/2009 11:27:39  _____________________________________________________________________  External Attachment:    Type:   Image     Comment:   External Document

## 2010-02-28 NOTE — Letter (Signed)
Summary: [facility]  [facility]   Imported By: Arta Bruce 08/03/2009 09:44:35  _____________________________________________________________________  External Attachment:    Type:   Image     Comment:   External Document

## 2010-02-28 NOTE — Letter (Signed)
Summary: [place]/NEW PT EVAL  [place]/NEW PT EVAL   Imported By: Arta Bruce 07/08/2009 14:48:38  _____________________________________________________________________  External Attachment:    Type:   Image     Comment:   External Document

## 2010-02-28 NOTE — Letter (Signed)
Summary: [facility]  [facility]   Imported By: Arta Bruce 03/03/2009 14:28:40  _____________________________________________________________________  External Attachment:    Type:   Image     Comment:   External Document

## 2010-02-28 NOTE — Letter (Signed)
Summary: [facility]//APPROVED  MED/[facility]UT[facility]//APPROVED   Imported By: Arta Bruce 03/16/2009 12:18:50  _____________________________________________________________________  External Attachment:    Type:   Image     Comment:   External Document

## 2010-02-28 NOTE — Letter (Signed)
Summary: [facility]  [facility]   Imported By: Arta Bruce 09/28/2009 14:59:40  _____________________________________________________________________  External Attachment:    Type:   Image     Comment:   External Document

## 2010-02-28 NOTE — Letter (Signed)
Summary: [facility]//OFFICE NOTE  [facility]OFFICE NOTE   Imported By: Arta Bruce 06/22/2009 11:36:56  _____________________________________________________________________  External Attachment:    Type:   Image     Comment:   External Document

## 2010-03-08 ENCOUNTER — Encounter: Payer: Self-pay | Admitting: Nurse Practitioner

## 2010-03-08 ENCOUNTER — Encounter (INDEPENDENT_AMBULATORY_CARE_PROVIDER_SITE_OTHER): Payer: Self-pay | Admitting: Nurse Practitioner

## 2010-03-08 DIAGNOSIS — R21 Rash and other nonspecific skin eruption: Secondary | ICD-10-CM | POA: Insufficient documentation

## 2010-03-08 LAB — CONVERTED CEMR LAB
ALT: 13 units/L (ref 0–35)
Albumin: 4.2 g/dL (ref 3.5–5.2)
Basophils Absolute: 0 10*3/uL (ref 0.0–0.1)
CO2: 28 meq/L (ref 19–32)
Calcium: 9.3 mg/dL (ref 8.4–10.5)
Chloride: 103 meq/L (ref 96–112)
Eosinophils Relative: 1 % (ref 0–5)
Glucose, Bld: 83 mg/dL (ref 70–99)
HCT: 41.7 % (ref 36.0–46.0)
Lymphocytes Relative: 35 % (ref 12–46)
Lymphs Abs: 1.9 10*3/uL (ref 0.7–4.0)
Neutro Abs: 3.1 10*3/uL (ref 1.7–7.7)
Platelets: 246 10*3/uL (ref 150–400)
Potassium: 4 meq/L (ref 3.5–5.3)
RDW: 13.9 % (ref 11.5–15.5)
Sodium: 143 meq/L (ref 135–145)
TSH: 2.315 microintl units/mL (ref 0.350–4.500)
Total Bilirubin: 0.3 mg/dL (ref 0.3–1.2)
Total Protein: 7 g/dL (ref 6.0–8.3)
WBC: 5.6 10*3/uL (ref 4.0–10.5)

## 2010-03-09 ENCOUNTER — Encounter (INDEPENDENT_AMBULATORY_CARE_PROVIDER_SITE_OTHER): Payer: Self-pay | Admitting: Nurse Practitioner

## 2010-03-10 ENCOUNTER — Telehealth (INDEPENDENT_AMBULATORY_CARE_PROVIDER_SITE_OTHER): Payer: Self-pay | Admitting: Nurse Practitioner

## 2010-03-13 ENCOUNTER — Encounter (INDEPENDENT_AMBULATORY_CARE_PROVIDER_SITE_OTHER): Payer: Self-pay | Admitting: Nurse Practitioner

## 2010-03-16 NOTE — Assessment & Plan Note (Signed)
Summary: Chronic issues   Vital Signs:  Patient profile:   55 year old female Menstrual status:  postmenopausal Weight:      169.38 pounds BMI:     28.29 Temp:     97.1 degrees F oral Pulse rate:   90 / minute Pulse rhythm:   regular Resp:     20 per minute BP sitting:   120 / 74  (left arm) Cuff size:   regular  Vitals Entered By: Hale Drone CMA (March 08, 2010 9:52 AM)  Nutrition Counseling: Patient's BMI is greater than 25 and therefore counseled on weight management options. CC: Having pain all over the body. Pain starts on the back of neck and will radiate to her right arm and left arm. Feels pain and numbness. Especially right arm. This happens when it's cold and windy. This is an everyday thing. Can't pick up anythng with her right hand. Also having rash on both legs and was seen by a congregatioanal nurse and was referred to PCP. Eating every 15 minutes small portions so her stomach wont hurt.  , Rash, Depression Is Patient Diabetic? No Pain Assessment Patient in pain? yes       Does patient need assistance? Functional Status Self care Ambulation Normal   Primary Care Provider:  Lehman Prom FNP  CC:  Having pain all over the body. Pain starts on the back of neck and will radiate to her right arm and left arm. Feels pain and numbness. Especially right arm. This happens when it's cold and windy. This is an everyday thing. Can't pick up anythng with her right hand. Also having rash on both legs and was seen by a congregatioanal nurse and was referred to PCP. Eating every 15 minutes small portions so her stomach wont hurt.  , Rash, and Depression.  History of Present Illness:  Pt into the office for several issues. Daughter present today to interpret  Rash      This is a 55 year old woman who presents with Rash.  The symptoms began 2 weeks ago.  The severity is described as mild.  The patient complains of redness.  The rash is located on the right leg and left  leg.  The rash is worse with scratching.  Associated symptoms include headache.  The patient denies history of recent infection, new medication, and pet/animal contact.    Pt does not have any of her medications - reports that she has finished all of her medications  Depression History:      The patient presents with symptoms of depression which have been present for greater than two weeks.  The patient is having a depressed mood most of the day and has a diminished interest in her usual daily activities.  Positive alarm features for depression include insomnia and fatigue (loss of energy).  However, she denies recurrent thoughts of death or suicide.        The patient denies that she feels like life is not worth living, denies that she wishes that she were dead, and denies that she has thought about ending her life.         Depression Treatment History:  Prior Medication Used:   Start Date: Assessment of Effect:   Comments:  Celexa (citalopram)     --       --       pt stopped taking Zoloft (sertraline)     03/08/2010     --  started   Current Medications (verified): 1)  Nexium 40 Mg Cpdr (Esomeprazole Magnesium) .... One Tablet By Mouth Daily Before Breakfast 2)  Lumbar Support Cushion   Misc (Misc. Devices) .... Need Lumbar Support Dx - Back Pain (724.2)and Scoliosis (737.30)  Size To Fit 3)  Ultram 50 Mg Tabs (Tramadol Hcl) .... One Tablet By Mouth Daily As Needed For Pain 4)  Citalopram Hydrobromide 20 Mg Tabs (Citalopram Hydrobromide) .... One Tablet By Mouth Daily 5)  Diclofenac Sodium 75 Mg Tbec (Diclofenac Sodium) .... One Tablet By Mouth Two Times A Day As Needed 6)  Hydrochlorothiazide 25 Mg Tabs (Hydrochlorothiazide) .... Take One (1) By Mouth Daily As Needed For Swelling 7)  Voltaren 1 % Gel (Diclofenac Sodium) .... Apply To Affected Area Up To Four Times Per Day 8)  Cetirizine Hcl 10 Mg Tabs (Cetirizine Hcl) .... One Tablet By Mouth Nightly For Breathing 9)  Hydroxyzine  Hcl 10 Mg Tabs (Hydroxyzine Hcl) .... One Tablet By Mouth Four Times Per Day  Allergies (verified): No Known Drug Allergies  Review of Systems General:  Complains of loss of appetite and sleep disorder. CV:  Complains of fatigue. Resp:  Complains of cough. GI:  Complains of abdominal pain. MS:  Complains of joint pain and stiffness; pain most notable with cold weather - daily pain. Derm:  Complains of rash.  Physical Exam  General:  alert.  flat affect Head:  normocephalic.   Mouth:  fair dentition.   Lungs:  normal breath sounds.   Heart:  normal rate and regular rhythm.   Abdomen:  normal bowel sounds.   Extremities:  trace left pedal edema and trace right pedal edema.   Neurologic:  alert & oriented X3.   Skin:  bil lower extremity scattered areas of loss of pigmentation dry skin Psych:  Oriented X3.     Impression & Recommendations:  Problem # 1:  SKIN RASH (ICD-782.1) nonspecific rash - would not prescribe any treatment other than to keep the legs lubricated  Problem # 2:  FATIGUE (ICD-780.79)  ongoing for pt combination of not sleeping well and ? 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 One tablet by mouth nightly for breathing  Problem # 5:  GERD (ICD-530.81)  restart meds Her updated medication list for this problem includes:    Nexium 40 Mg Cpdr (Esomeprazole magnesium) ..... One tablet by mouth daily before breakfast  Orders: T-Comprehensive Metabolic Panel ([id number])  Complete Medication List: 1)  Nexium 40 Mg Cpdr (Esomeprazole magnesium) .... One tablet by mouth daily before  breakfast 2)  Lumbar Support Cushion Misc (Misc. devices) .... Need lumbar support dx - back pain (724.2)and scoliosis (737.30)  size to fit 3)  Hydrochlorothiazide 25 Mg Tabs (Hydrochlorothiazide) .... Take one (1) by mouth daily as needed for swelling 4)  Cetirizine Hcl 10 Mg Tabs (Cetirizine hcl) .... One tablet by mouth nightly for breathing 5)  Hydroxyzine Hcl 10 Mg Tabs (Hydroxyzine hcl) .... One tablet by mouth four times per day 6)  Lac-hydrin 12 % Lotn (Ammonium lactate) .... One application topically to bilateral lower extremities 7)  Meloxicam 15 Mg Tabs (Meloxicam) .... One  tablet by mouth daily for joints 8)  Sertraline Hcl 50 Mg Tabs (Sertraline hcl) .... One tablet by mouth nightly for mood and rest  Patient Instructions: 1)  Restart all your medications according to the outline. 2)  Follow up in 4 weeks for medication review.   3)  No food after midnight before this visit.  You will need lipids. Prescriptions: SERTRALINE HCL 50 MG TABS (SERTRALINE HCL) One tablet by mouth nightly for mood and rest  #30 x 3   Entered and Authorized by:   Lehman Prom FNP   Signed by:   Lehman Prom FNP on 03/08/2010   Method used:   Print then Give to Patient   RxID:   [id number] MELOXICAM 15 MG TABS (MELOXICAM) One tablet by mouth daily for joints  #30 x 11   Entered and Authorized by:   Lehman Prom FNP   Signed by:   Lehman Prom FNP on 03/08/2010   Method used:   Print then Give to Patient   RxID:   [id number] LAC-HYDRIN 12 % LOTN (AMMONIUM LACTATE) One application topically to bilateral lower extremities  #255ml x 0   Entered and Authorized by:   Lehman Prom FNP   Signed by:   Lehman Prom FNP on 03/08/2010   Method used:   Print then Give to Patient   RxID:   [id number] CETIRIZINE HCL 10 MG TABS (CETIRIZINE HCL) One tablet by mouth nightly for breathing  #30 x 11   Entered and Authorized by:   Lehman Prom FNP   Signed by:   Lehman Prom  FNP on 03/08/2010   Method used:   Print then Give to Patient   RxID:   [id number] NEXIUM 40 MG CPDR (ESOMEPRAZOLE MAGNESIUM) One tablet by mouth daily before breakfast  #30 x 11   Entered and Authorized by:   Lehman Prom FNP   Signed by:   Lehman Prom FNP on 03/08/2010   Method used:   Print then Give to Patient   RxID:   [id number]    Orders Added: 1)  Est. Patient Level IV [[address]] 2)  T-Comprehensive Metabolic Panel [[id number]] 3)  T-CBC w/Diff [[id number]] 4)  T-TSH [[id number]]

## 2010-03-16 NOTE — Letter (Signed)
Summary: *HSN Results Follow up  [facility]  [address] [place], [place] [address]   Phone: [phone]  Fax: [phone]      03/09/2010   Kayla Davies [address] [place], [place]  [address]   Dear  Ms. Kayla Davies,                            ____S.Drinkard,FNP   ____D. Gore,FNP       ____B. McPherson,MD   ____V. Rankins,MD    ____E. Mulberry,MD    __X__N. Daphine Deutscher, FNP  ____D. Reche Dixon, MD    ____K. Philipp Deputy, MD    ____Other     This letter is to inform you that your recent test(s):  _______Pap Smear    ____X___Lab Test     _______X-ray    __X_____ is within acceptable limits  _______ requires a medication change  _______ requires a follow-up lab visit  _______ requires a follow-up visit with your provider   Comments:  Labs done during recent office visit are normal.       _________________________________________________________ If you have any questions, please contact our office [phone].                    Sincerely,    Kayla Prom FNP [facility]

## 2010-03-22 NOTE — Progress Notes (Signed)
Summary: PA needed for Meloxicam  Phone Note Outgoing Call   Summary of Call: Received prior authorization request for Meloxicam.  Pt. doesn't show a history of having tried and failed preferred meds other than diclofenac.  Do you want to change this medication to a preferred?  Preferred list in your refill rack.  Initial call taken by: Dutch Quint RN,  March 10, 2010 6:04 PM  Follow-up for Phone Call        will change to piroxicam new rx sent electronically Follow-up by: Lehman Prom FNP,  March 13, 2010 8:14 AM    New/Updated Medications: PIROXICAM 10 MG CAPS (PIROXICAM) One tablet by mouth daily after breakfast for joints Prescriptions: PIROXICAM 10 MG CAPS (PIROXICAM) One tablet by mouth daily after breakfast for joints  #30 x 5   Entered and Authorized by:   Lehman Prom FNP   Signed by:   Lehman Prom FNP on 03/13/2010   Method used:   Electronically to        [facility] (retail)       [address]       [place], [place]  [id number]       Ph: [phone]       Fax: [phone]   RxID:   [id number]

## 2010-03-22 NOTE — Miscellaneous (Signed)
Summary: Voltaren Gel  Clinical Lists Changes  Medications: Added new medication of VOLTAREN 1 % GEL (DICLOFENAC SODIUM) Apply to affected area up to 4 times per day - Signed Rx of VOLTAREN 1 % GEL (DICLOFENAC SODIUM) Apply to affected area up to 4 times per day;  #187ml x 5;  Signed;  Entered by: Lehman Prom FNP;  Authorized by: Lehman Prom FNP;  Method used: Electronically to [facility] [address] [address], [place], [place]  [id number], Ph: [phone], Fax: [phone]    Prescriptions: VOLTAREN 1 % GEL (DICLOFENAC SODIUM) Apply to affected area up to 4 times per day  #138ml x 5   Entered and Authorized by:   Lehman Prom FNP   Signed by:   Lehman Prom FNP on 03/13/2010   Method used:   Electronically to        [facility] (retail)       [address]       [place], [place]  [id number]       Ph: [phone]       Fax: [phone]   RxID:   [id number]

## 2010-04-05 ENCOUNTER — Encounter: Payer: Self-pay | Admitting: Nurse Practitioner

## 2010-04-05 ENCOUNTER — Encounter (INDEPENDENT_AMBULATORY_CARE_PROVIDER_SITE_OTHER): Payer: Self-pay | Admitting: Nurse Practitioner

## 2010-04-05 DIAGNOSIS — E663 Overweight: Secondary | ICD-10-CM | POA: Insufficient documentation

## 2010-04-06 ENCOUNTER — Encounter (INDEPENDENT_AMBULATORY_CARE_PROVIDER_SITE_OTHER): Payer: Self-pay | Admitting: Nurse Practitioner

## 2010-04-06 DIAGNOSIS — E7800 Pure hypercholesterolemia, unspecified: Secondary | ICD-10-CM | POA: Insufficient documentation

## 2010-04-06 LAB — CONVERTED CEMR LAB
Cholesterol: 204 mg/dL — ABNORMAL HIGH (ref 0–200)
Total CHOL/HDL Ratio: 5.5

## 2010-04-11 NOTE — Letter (Signed)
Summary: Lipid Letter  [facility]  [address] [place], [place] [address]   Phone: [phone]  Fax: [phone]    04/06/2010  Kayla Davies [address] [place], [place]  [address]  Dear Kayla Davies:  We have carefully reviewed your last lipid profile from 04/05/2010 and the results are noted below with a summary of recommendations for lipid management.    Cholesterol:       204     Goal: less than 200   HDL "good" Cholesterol:   37     Goal: greater than 40   LDL "bad" Cholesterol:   131     Goal: less than 130   Triglycerides:       182     Goal: less than 150    Labs done during recent visit shows that your cholesterol is slightly elevated but not need for medications.  Just try to eat more fried instead of baked foods.     Current Medications: 1)    Nexium 40 Mg Cpdr (Esomeprazole magnesium) .... One tablet by mouth daily before breakfast 2)    Lumbar Support Cushion   Misc (Misc. devices) .... Need lumbar support dx - back pain (724.2)and scoliosis (737.30)  size to fit 3)    Hydrochlorothiazide 25 Mg Tabs (Hydrochlorothiazide) .... Take one (1) by mouth daily as needed for swelling 4)    Cetirizine Hcl 10 Mg Tabs (Cetirizine hcl) .... One tablet by mouth nightly for breathing 5)    Hydroxyzine Hcl 10 Mg Tabs (Hydroxyzine hcl) .... One tablet by mouth four times per day 6)    Lac-hydrin 12 % Lotn (Ammonium lactate) .... One application topically to bilateral lower extremities 7)    Sertraline Hcl 50 Mg Tabs (Sertraline hcl) .... One tablet by mouth nightly for mood and rest 8)    Voltaren 1 % Gel (Diclofenac sodium) .... Apply to affected area up to 4 times per day 9)    Indomethacin 25 Mg Caps (Indomethacin) .... One tablet by mouth two times a day as needed for joints  If you have any questions, please call. We appreciate being able to work with you.   Sincerely,    [facility] Lehman Prom  FNP

## 2010-04-11 NOTE — Assessment & Plan Note (Signed)
Summary: Chronic issues   Vital Signs:  Patient profile:   55 year old female Menstrual status:  postmenopausal Weight:      165.8 pounds BMI:     27.69 Temp:     97.9 degrees F oral Pulse rate:   70 / minute Pulse rhythm:   regular Resp:     20 per minute BP sitting:   126 / 85  (left arm) Cuff size:   regular  Vitals Entered By: Levon Hedger (April 05, 2010 9:53 AM)  Nutrition Counseling: Patient's BMI is greater than 25 and therefore counseled on weight management options. CC: follow-up visit 4 week med review, Abdominal Pain Is Patient Diabetic? No Pain Assessment Patient in pain? no       Does patient need assistance? Functional Status Self care Ambulation Normal   Primary Care Provider:  Lehman Prom FNP  CC:  follow-up visit 4 week med review and Abdominal Pain.  History of Present Illness:  Pt into the office for 4 week f/u. Pt has multiple chronic complaints and issues for which she needs f/u fasting today for labs She presents today with all her medications as restarted on all her medications. reports that she stopped taking piroxicam due to nausea and vomiting  Daughter present today during the exam who is interpretering for pt  Dyspepsia History:      She has no alarm features of dyspepsia including no history of melena, hematochezia, dysphagia, persistent vomiting, or involuntary weight loss > 5%.  There is a prior history of GERD.  The patient does not have a prior history of documented ulcer disease.  The dominant symptom is not heartburn or acid reflux.  An H-2 blocker medication is not currently being taken.     Habits & Providers  Alcohol-Tobacco-Diet     Alcohol drinks/day: 0     Tobacco Status: never  Exercise-Depression-Behavior     Does Patient Exercise: no     Have you felt down or hopeless? no     Have you felt little pleasure in things? no     Depression Counseling: not indicated; screening negative for depression     Drug  Use: no     Seat Belt Use: 100     Sun Exposure: occasionally  Allergies (verified): No Known Drug Allergies  Review of Systems General:  Denies fatigue and sleep disorder; sleep habits have improved since started sertralazine. [facility] GI:  Denies indigestion; pt is taking nexium and symptoms have improved. MS:  Complains of joint pain and stiffness; pt is not able to take the piroxicam due to n/v.   still has some knee pain .  Physical Exam  General:  alert.   Head:  normocephalic.   Ears:  ear piercing(s) noted.   Lungs:  normal breath sounds.   Heart:  normal rate and regular rhythm.   Abdomen:  normal bowel sounds.   Neurologic:  alert & oriented X3.   Skin:  color normal.   Psych:  Oriented X3.     Impression & Recommendations:  Problem # 1:  ALLERGIC RHINITIS (ICD-477.9) continue current meds - symptoms are improved Her updated medication list for this problem includes:    Cetirizine Hcl 10 Mg Tabs (Cetirizine hcl) ..... 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 One tablet by mouth daily before breakfast  Complete Medication List: 1)  Nexium 40 Mg Cpdr (Esomeprazole magnesium) .... One tablet by mouth daily before breakfast 2)  Lumbar Support Cushion Misc (Misc. devices) .... Need lumbar support dx - back pain (724.2)and scoliosis (737.30)  size to fit 3)   Hydrochlorothiazide 25 Mg Tabs (Hydrochlorothiazide) .... Take one (1) by mouth daily as needed for swelling 4)  Cetirizine Hcl 10 Mg Tabs (Cetirizine hcl) .... One tablet by mouth nightly for breathing 5)  Hydroxyzine Hcl 10 Mg Tabs (Hydroxyzine hcl) .... One tablet by mouth four times per day 6)  Lac-hydrin 12 % Lotn (Ammonium lactate) .... One application topically to bilateral lower extremities 7)  Sertraline Hcl 50 Mg Tabs (Sertraline hcl) .... One tablet by mouth nightly for mood and rest 8)  Voltaren 1 % Gel (Diclofenac sodium) .... Apply to affected area up to 4 times per day 9)  Indomethacin 25 Mg Caps (Indomethacin) .... One tablet by mouth two times a day as needed for joints  Other Orders: T-Lipid Profile ([id number])  Dyspepsia Assessment/Plan:  Step Therapy: GERD Treatment Protocols:    Step-1: started  Patient Instructions: 1)  All the medications have refills - keep taking ALL the medications.  return to the pharmacy to get refills when you finish with the current bottle. 2)  Your cholesterol labs will be checked today and you will be notified of the results 3)  Start an exercise routine - go for walk for 10-15 minutes daily.  This will help your overall help.  Do this especially on the warm days. 4)  Follow up with n.martin, fnp in 6 weeks. Bring all your medications with you to this visit Prescriptions: INDOMETHACIN 25 MG CAPS (INDOMETHACIN) One tablet by mouth two times a day as needed for joints  #60 x 1   Entered and Authorized by:   Lehman Prom FNP   Signed by:   Lehman Prom FNP on 04/05/2010   Method used:   Print then Give to Patient   RxID:   [id number]    Orders Added: 1)  Est. Patient Level IV [78469] 2)  T-Lipid Profile [[id number]]

## 2010-04-11 NOTE — Letter (Signed)
Summary: HANDICAPPED PLACARD  HANDICAPPED PLACARD   Imported By: Arta Bruce 04/06/2010 11:08:20  _____________________________________________________________________  External Attachment:    Type:   Image     Comment:   External Document

## 2010-05-29 ENCOUNTER — Ambulatory Visit: Payer: Medicaid Other | Attending: Internal Medicine | Admitting: Physical Therapy

## 2010-05-29 DIAGNOSIS — M545 Low back pain, unspecified: Secondary | ICD-10-CM | POA: Insufficient documentation

## 2010-05-29 DIAGNOSIS — IMO0001 Reserved for inherently not codable concepts without codable children: Secondary | ICD-10-CM | POA: Insufficient documentation

## 2010-06-01 ENCOUNTER — Ambulatory Visit: Payer: Medicaid Other | Attending: Internal Medicine | Admitting: Physical Therapy

## 2010-06-01 DIAGNOSIS — IMO0001 Reserved for inherently not codable concepts without codable children: Secondary | ICD-10-CM | POA: Insufficient documentation

## 2010-06-01 DIAGNOSIS — M545 Low back pain, unspecified: Secondary | ICD-10-CM | POA: Insufficient documentation

## 2010-06-05 ENCOUNTER — Ambulatory Visit: Payer: Medicaid Other | Admitting: Physical Therapy

## 2010-06-08 ENCOUNTER — Ambulatory Visit: Payer: Medicaid Other | Admitting: Physical Therapy

## 2010-06-13 ENCOUNTER — Ambulatory Visit: Payer: Medicaid Other | Admitting: Physical Therapy

## 2010-06-13 NOTE — Procedures (Signed)
Kayla Davies, Kayla Davies                 ACCOUNT NO.:  [id number]   MEDICAL RECORD NO.:  [id number]          PATIENT TYPE:  OUT   LOCATION:  SLEEP CENTER                 FACILITY:  [facility]   PHYSICIAN:  Oretha Milch, MD      DATE OF BIRTH:  01/29/1955   DATE OF STUDY:  07/28/2008                            NOCTURNAL POLYSOMNOGRAM   REFERRING PHYSICIAN:   INDICATION FOR THE STUDY:  Kayla Davies is a 55 year old woman with  excessive daytime fatigue, snoring, and nonrestorative sleep.  At the  time of the study, she weighed 166 pounds with a height of 5 feet 5  inches, BMI of 28, and neck size of 12 inches.   EPWORTH SLEEPINESS SCORE:  5/24.   This overnight polysomnogram was performed with a sleep technologist in  attendance.  EEG, EOG, EMG, EKG, and respiratory parameters were  recorded.  Sleep stages arousals, limb movements, and respiratory data  were scored according to criteria laid out by the American Academy of  Sleep Medicine.   SLEEP ARCHITECTURE:  Lights out was at 11:41 p.m., lights on was at 5:40  a.m.  Total sleep time was 255 minutes with a sleep period time of 310  minutes with a sleep efficiency of 71%.  Sleep stages of the percentage  of total sleep time was N1 13%, N2 61%, N3 3%, and REM sleep 17% (42.5  minutes).  REM sleep was noted in 2 periods at 3 a.m. and 5 a.m.  Supine  REM sleep accounted for 15.5 minutes.   AROUSAL DATA:  There were a total of 184 arousals with the arousal index  of 43 events per hour.  Of these, 152 were spontaneous and 23 were  associated with respiratory events.   RESPIRATORY DATA:  There were a total of 0 obstructive apneas, 1 central  apnea, 0 mixed apneas, and 33 hypopneas with an apnea/hypopnea index of  8 events per hour, 42 respiratory effort related arousals were noted  with an RDI at 17.9 events per hour.  The REM AHI was 27 events per hour  and all of these were noted during supine sleep or REM supine AHI of 73  events per hour.    OXYGEN SATURATION DATA:  The lowest desaturation was 74% during REM  sleep.  She spent 4 minutes with a saturation less than 88%.   LIMB MOVEMENT DATA:  Four periodic limb movements were noted associated  with arousals for PLM arousal index of 0.9 events per hour.   CARDIAC DATA:  No significant arrhythmias were noted.  The low heart  rate was 37 beats per minute and the high heart was 110 beats per  minute.   DISCUSSION:  She was desensitized with a small ResMed Quattro nasal  mask.  However, she did not meet split night criteria, most events were  noted during REM supine sleep.  Oxygen desaturation was worst during REM  supine sleep.   IMPRESSION:  1. Mild obstructive sleep apnea with hypopneas and respiratory effort      related arousals causing oxygen desaturation and sleep      fragmentation.  This was predominantly  during REM supine sleep.  2. No evidence of cardiac arrhythmias, limb movements, or behavioral      disturbance during sleep.   RECOMMENDATIONS:  1. The treatment options for this degree of sleep disordered breathing      include positional therapy and CPAP therapy.  She can be asked to      avoid sleeping in the supine position.  2. If symptoms persist in spite of this, further options such as CPAP      therapy, oral appliances, or upper airway surgery can be      considered.  I would advocate trying positional therapy first.  Her      BMI seems to be adequate.  3.She should avoid medications with sedative side effects.  She should  be cautioned against driving when sleepy.      Oretha Milch, MD  Electronically Signed     RVA/MEDQ  D:  08/04/2008 13:25:06  T:  08/05/2008 02:12:34  Job:  [id number]

## 2010-06-15 ENCOUNTER — Encounter: Payer: Medicaid Other | Admitting: Physical Therapy

## 2010-07-11 ENCOUNTER — Other Ambulatory Visit: Payer: Self-pay | Admitting: Neurology

## 2010-07-11 ENCOUNTER — Ambulatory Visit
Admission: RE | Admit: 2010-07-11 | Discharge: 2010-07-11 | Disposition: A | Discharge status: Home / Self Care | Payer: Medicaid Other | Source: Ambulatory Visit | Attending: Neurology | Admitting: Neurology

## 2010-07-11 DIAGNOSIS — M791 Myalgia, unspecified site: Secondary | ICD-10-CM

## 2010-07-12 ENCOUNTER — Inpatient Hospital Stay (INDEPENDENT_AMBULATORY_CARE_PROVIDER_SITE_OTHER)
Admission: RE | Admit: 2010-07-12 | Discharge: 2010-07-12 | Disposition: A | Discharge status: Home / Self Care | Payer: Medicaid Other | Source: Ambulatory Visit | Attending: Family Medicine | Admitting: Family Medicine

## 2010-07-12 ENCOUNTER — Ambulatory Visit (INDEPENDENT_AMBULATORY_CARE_PROVIDER_SITE_OTHER): Payer: Medicaid Other

## 2010-07-12 DIAGNOSIS — J40 Bronchitis, not specified as acute or chronic: Secondary | ICD-10-CM

## 2010-07-17 ENCOUNTER — Other Ambulatory Visit: Payer: Self-pay | Admitting: Internal Medicine

## 2010-07-17 DIAGNOSIS — Z1231 Encounter for screening mammogram for malignant neoplasm of breast: Secondary | ICD-10-CM

## 2010-07-21 ENCOUNTER — Encounter: Payer: Self-pay | Admitting: Critical Care Medicine

## 2010-07-24 ENCOUNTER — Encounter: Payer: Self-pay | Admitting: *Deleted

## 2010-07-24 ENCOUNTER — Ambulatory Visit (INDEPENDENT_AMBULATORY_CARE_PROVIDER_SITE_OTHER): Payer: Medicaid Other | Admitting: Critical Care Medicine

## 2010-07-24 ENCOUNTER — Encounter: Payer: Self-pay | Admitting: Critical Care Medicine

## 2010-07-24 VITALS — BP 128/88 | HR 72 | Temp 97.5°F | Ht 67.0 in | Wt 164.4 lb

## 2010-07-24 DIAGNOSIS — R0600 Dyspnea, unspecified: Secondary | ICD-10-CM

## 2010-07-24 DIAGNOSIS — R0609 Other forms of dyspnea: Secondary | ICD-10-CM

## 2010-07-24 DIAGNOSIS — J45909 Unspecified asthma, uncomplicated: Secondary | ICD-10-CM

## 2010-07-24 DIAGNOSIS — K219 Gastro-esophageal reflux disease without esophagitis: Secondary | ICD-10-CM

## 2010-07-24 DIAGNOSIS — R0989 Other specified symptoms and signs involving the circulatory and respiratory systems: Secondary | ICD-10-CM

## 2010-07-24 MED ORDER — METOCLOPRAMIDE HCL 10 MG PO TABS: 10.0000 mg | ORAL_TABLET | Freq: Four times a day (QID) | ORAL | Status: AC

## 2010-07-24 MED ORDER — DEXLANSOPRAZOLE 60 MG PO CPDR: 60.0000 mg | DELAYED_RELEASE_CAPSULE | Freq: Every day | ORAL | Status: DC

## 2010-07-24 NOTE — Patient Instructions (Signed)
Stop ranitidine Start Dexilant one daily Start Reglan one with meals three times daily and bedtime A referral to Dr Loreta Ave of GI will be made Return 2 months

## 2010-07-24 NOTE — Progress Notes (Signed)
Subjective:    Patient ID: Kayla Davies, female    DOB: Dec 07, 1955, 55 y.o.   MRN: [id number]  HPI 55 y.o.  [place] F speaks no english. Daughter here with disruptive special needs child and also speaks minimal english, no interpreter available.   Cough one year with cough.  Emesis for one month.  Cough was dry for one year, now past month emesis and pain in chest  No sleep at night. If eats will throw up .  Bowels are loose.  Has seen GI for abd pain in the past: Dr Loreta Ave.  Past Medical History  Diagnosis Date  . Obstructive sleep apnea (adult) (pediatric)   . Unspecified vitamin D deficiency   . Edema   . Other malaise and fatigue   . Allergic rhinitis, cause unspecified   . Unspecified asthma   . Unspecified arthropathy, other unspecified sites   . Scoliosis (and kyphoscoliosis), idiopathic   . 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 Esophageal reflux   . Asymptomatic varicose veins   . Lumbago   . Pain in joint, shoulder region   . Cervicalgia   . Bronchitis      History reviewed. No pertinent family history.   History   Social History  . Marital Status: Single    Spouse Name: N/A    Number of Children: 3  . Years of Education: N/A   Occupational History  . Not on file.   Social History Main Topics  . Smoking status: Never Smoker   . Smokeless tobacco: Never Used  . Alcohol Use: No  . Drug Use: No  . Sexually Active: Not on file   Other Topics Concern  . Not on file   Social History Narrative  . No narrative on file     No Known Allergies   Outpatient Prescriptions Prior to Visit  Medication Sig Dispense Refill  . albuterol (PROVENTIL HFA) 108 (90 BASE) MCG/ACT inhaler Inhale 2 puffs into the lungs every 4 (four) hours as needed. For cough or SOB      . ammonium lactate (AMLACTIN) 12 % cream Apply topically as needed.        . diclofenac sodium (VOLTAREN) 1 % GEL Apply topically 4 (four) times daily as needed.        . hydrOXYzine (ATARAX) 10 MG tablet Take 10 mg by mouth every 6 (six) hours as needed.        . sertraline (ZOLOFT) 50 MG tablet Take 50 mg by mouth daily.        . cetirizine (ZYRTEC) 10 MG tablet Take 10 mg by mouth daily.        Marland Kitchen esomeprazole (NEXIUM) 40 MG capsule Take 40 mg by mouth daily before breakfast.        . hydrochlorothiazide 25 MG tablet Take 25 mg by mouth daily.        . indomethacin (INDOCIN) 25 MG capsule Take 25 mg by mouth 2 (two) times daily with a meal.            Review of Systems  Constitutional: Positive for fever, chills, diaphoresis, activity change, appetite change and fatigue. Negative for unexpected weight change.       Poor appt  HENT: Positive for congestion, sore throat, facial swelling, rhinorrhea, sneezing, trouble swallowing, neck pain, sinus pressure and ear discharge. Negative for hearing loss, ear pain, nosebleeds, mouth sores, neck stiffness, dental problem, voice change, postnasal drip and tinnitus.  Eyes: Positive for discharge and itching. Negative for photophobia and visual disturbance.  Respiratory: Positive for cough, choking, chest tightness, shortness of breath and wheezing. Negative for apnea and stridor.   Cardiovascular: Positive for chest pain and leg swelling. Negative for palpitations.       Has burning in chest that is constant  Gastrointestinal: Positive for nausea, vomiting, abdominal pain and abdominal distention. Negative for constipation and blood in stool.       Pain is sharp When vomits ,  Yellow mucus, no blood   Genitourinary: Negative for dysuria, urgency, frequency, hematuria, flank pain, decreased urine volume and difficulty urinating.  Musculoskeletal: Positive for back pain. Negative for myalgias, joint swelling, arthralgias and gait problem.  Skin: Positive for rash. Negative for color change and pallor.  Neurological: Positive for dizziness, tremors, weakness, numbness and headaches. Negative for seizures, syncope,  speech difficulty and light-headedness.  Hematological: Negative for adenopathy. Does not bruise/bleed easily.  Psychiatric/Behavioral: Positive for confusion, sleep disturbance and agitation. The patient is nervous/anxious.    Past Medical History  Diagnosis Date  . Obstructive sleep apnea (adult) (pediatric)   . Unspecified vitamin D deficiency   . Edema   . Other malaise and fatigue   . Allergic rhinitis, cause unspecified   . Unspecified asthma   . Unspecified arthropathy, other unspecified sites   . Scoliosis (and kyphoscoliosis), idiopathic   . 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 Esophageal reflux   . Asymptomatic varicose veins   . Lumbago   . Pain in joint, shoulder region   . Cervicalgia   . Bronchitis      History reviewed. No pertinent family history.   History   Social History  . Marital Status: Single    Spouse Name: N/A    Number of Children: 3  . Years of Education: N/A   Occupational History  . Not on file.   Social History Main Topics  . Smoking status: Never Smoker   . Smokeless tobacco: Never Used  . Alcohol Use: No  . Drug Use: No  . Sexually Active: Not on file   Other Topics Concern  . Not on file   Social History Narrative  . No narrative on file     No Known Allergies   Outpatient Prescriptions Prior to Visit  Medication Sig Dispense Refill  . albuterol (PROVENTIL HFA) 108 (90 BASE) MCG/ACT inhaler Inhale 2 puffs into the lungs every 4 (four) hours as needed. For cough or SOB      . ammonium lactate (AMLACTIN) 12 % cream Apply topically as needed.        . diclofenac sodium (VOLTAREN) 1 % GEL Apply topically 4 (four) times daily as needed.       . hydrOXYzine (ATARAX) 10 MG tablet Take 10 mg by mouth every 6 (six) hours as needed.        . sertraline (ZOLOFT) 50 MG tablet Take 50 mg by mouth daily.        . cetirizine (ZYRTEC) 10 MG tablet Take 10 mg by mouth daily.        Marland Kitchen esomeprazole  (NEXIUM) 40 MG capsule Take 40 mg by mouth daily before breakfast.        . hydrochlorothiazide 25 MG tablet Take 25 mg by mouth daily.        . indomethacin (INDOCIN) 25 MG capsule Take 25 mg by mouth 2 (two) times daily with a meal.  Objective:   Physical Exam Filed Vitals:   07/24/10 1539  BP: 128/88  Pulse: 72  Temp: 97.5 F (36.4 C)  TempSrc: Oral  Height: 5\' 7"  (1.702 m)  Weight: 164 lb 6.4 oz (74.571 kg)  SpO2: 98%    Gen:  Thin AAF with paroxysmal coughing  in no distress,  normal affect  ENT: No lesions,  mouth clear,  oropharynx clear, no postnasal drip  Neck: No JVD, no TMG, no carotid bruits  Lungs: No use of accessory muscles, no dullness to percussion, few exp pseudowheeze Cardiovascular: RRR, heart sounds normal, no murmur or gallops, no peripheral edema  Abdomen: soft and NT, no HSM,  BS normal  Musculoskeletal: No deformities, no cyanosis or clubbing  Neuro: alert, non focal  Skin: Warm, no lesions or rashes      Cleda Daub 6/25:  Mild restriction 07/12/10: CXR:  No active disease Assessment & Plan:   REACTIVE AIRWAY DISEASE Upper airway instability d/t severe GERD and cyclical cough.  Doubt true asthma  Severe chest pain syndrome and associated hyperemesis and Dysphagia  Plan Start Dexilant 60mg  daily, samples given Start metaclopramide 10mg  tid ac and hs Referral to Dr Arty Baumgartner for EGD and eval.  I discussed this case with Dr Loreta Ave No indication for inhaled medications Note Exam/history SEVERELY limited by language barrier and the fact the pt's Daughter brought a special needs child with her to the exam who was disruptive with the staff and examination. Future OVs need to be with interpreter and the special needs child will need child care      Updated Medication List Outpatient Encounter Prescriptions as of 07/24/2010  Medication Sig Dispense Refill  . albuterol (PROVENTIL HFA) 108 (90 BASE) MCG/ACT inhaler Inhale 2 puffs into the  lungs every 4 (four) hours as needed. For cough or SOB      . ammonium lactate (AMLACTIN) 12 % cream Apply topically as needed.        . diclofenac sodium (VOLTAREN) 1 % GEL Apply topically 4 (four) times daily as needed.       . hydrOXYzine (ATARAX) 10 MG tablet Take 10 mg by mouth every 6 (six) hours as needed.        . sertraline (ZOLOFT) 50 MG tablet Take 50 mg by mouth daily.        Marland Kitchen DISCONTD: ranitidine (ZANTAC) 150 MG tablet Take 150 mg by mouth 2 (two) times daily.        Marland Kitchen dexlansoprazole (DEXILANT) 60 MG capsule Take 1 capsule (60 mg total) by mouth daily.  30 capsule  4  . metoCLOPramide (REGLAN) 10 MG tablet Take 1 tablet (10 mg total) by mouth 4 (four) times daily.  90 tablet  6  . DISCONTD: cetirizine (ZYRTEC) 10 MG tablet Take 10 mg by mouth daily.        Marland Kitchen DISCONTD: esomeprazole (NEXIUM) 40 MG capsule Take 40 mg by mouth daily before breakfast.        . DISCONTD: hydrochlorothiazide 25 MG tablet Take 25 mg by mouth daily.        Marland Kitchen DISCONTD: indomethacin (INDOCIN) 25 MG capsule Take 25 mg by mouth 2 (two) times daily with a meal.

## 2010-07-26 NOTE — Assessment & Plan Note (Signed)
Upper airway instability d/t severe GERD and cyclical cough.  Doubt true asthma  Severe chest pain syndrome and associated hyperemesis and Dysphagia  Plan Start Dexilant 60mg  daily, samples given Start metaclopramide 10mg  tid ac and hs Referral to Dr Arty Baumgartner for EGD and eval.  I discussed this case with Dr Loreta Ave No indication for inhaled medications Note Exam/history SEVERELY limited by language barrier and the fact the pt's Daughter brought a special needs child with her to the exam who was disruptive with the staff and examination. Future OVs need to be with interpreter and the special needs child will need child care

## 2010-08-10 ENCOUNTER — Telehealth: Payer: Self-pay | Admitting: Critical Care Medicine

## 2010-08-10 ENCOUNTER — Other Ambulatory Visit: Payer: Self-pay | Admitting: Gastroenterology

## 2010-08-10 NOTE — Telephone Encounter (Signed)
Dexilant APPROVED through pt's insurance at [phone].  Member ID # is [id number] L. APPROVED through 07/31/2011. [facility] notified and they will call the patient.

## 2010-08-11 ENCOUNTER — Ambulatory Visit
Admission: RE | Admit: 2010-08-11 | Discharge: 2010-08-11 | Disposition: A | Discharge status: Home / Self Care | Payer: Medicaid Other | Source: Ambulatory Visit | Attending: Gastroenterology | Admitting: Gastroenterology

## 2010-08-16 ENCOUNTER — Ambulatory Visit (HOSPITAL_COMMUNITY)
Admission: RE | Admit: 2010-08-16 | Discharge: 2010-08-16 | Disposition: A | Discharge status: Home / Self Care | Payer: Medicaid Other | Source: Ambulatory Visit | Attending: Internal Medicine | Admitting: Internal Medicine

## 2010-08-16 ENCOUNTER — Other Ambulatory Visit: Payer: Self-pay | Admitting: Internal Medicine

## 2010-08-16 DIAGNOSIS — R0989 Other specified symptoms and signs involving the circulatory and respiratory systems: Secondary | ICD-10-CM | POA: Insufficient documentation

## 2010-08-16 DIAGNOSIS — R05 Cough: Secondary | ICD-10-CM

## 2010-08-16 DIAGNOSIS — R059 Cough, unspecified: Secondary | ICD-10-CM | POA: Insufficient documentation

## 2010-10-03 ENCOUNTER — Ambulatory Visit
Admission: RE | Admit: 2010-10-03 | Discharge: 2010-10-03 | Disposition: A | Discharge status: Home / Self Care | Payer: Medicaid Other | Source: Ambulatory Visit | Attending: Internal Medicine | Admitting: Internal Medicine

## 2010-10-03 DIAGNOSIS — Z1231 Encounter for screening mammogram for malignant neoplasm of breast: Secondary | ICD-10-CM

## 2010-10-04 ENCOUNTER — Other Ambulatory Visit: Payer: Self-pay | Admitting: Internal Medicine

## 2010-10-04 DIAGNOSIS — N644 Mastodynia: Secondary | ICD-10-CM

## 2010-10-18 ENCOUNTER — Other Ambulatory Visit: Payer: Self-pay | Admitting: Neurology

## 2010-10-18 DIAGNOSIS — M47812 Spondylosis without myelopathy or radiculopathy, cervical region: Secondary | ICD-10-CM

## 2010-10-20 ENCOUNTER — Ambulatory Visit
Admission: RE | Admit: 2010-10-20 | Discharge: 2010-10-20 | Disposition: A | Discharge status: Home / Self Care | Payer: Medicaid Other | Source: Ambulatory Visit | Attending: Neurology | Admitting: Neurology

## 2010-10-20 DIAGNOSIS — M542 Cervicalgia: Secondary | ICD-10-CM

## 2010-10-20 DIAGNOSIS — M47812 Spondylosis without myelopathy or radiculopathy, cervical region: Secondary | ICD-10-CM

## 2010-10-20 DIAGNOSIS — M25519 Pain in unspecified shoulder: Secondary | ICD-10-CM

## 2010-10-20 MED ORDER — IOHEXOL 300 MG/ML  SOLN
1.0000 mL | Freq: Once | INTRAMUSCULAR | Status: AC | PRN
  Administered 2010-10-20: 1 mL via EPIDURAL

## 2010-10-20 MED ORDER — TRIAMCINOLONE ACETONIDE 40 MG/ML IJ SUSP (RADIOLOGY)
60.0000 mg | Freq: Once | INTRAMUSCULAR | Status: AC
  Administered 2010-10-20: 60 mg via EPIDURAL

## 2010-10-24 LAB — POCT I-STAT, CHEM 8
Calcium, Ion: 1.05 — ABNORMAL LOW
Calcium, Ion: 1.1 — ABNORMAL LOW
Glucose, Bld: 94
HCT: 40
Hemoglobin: 13.6
Sodium: 139
TCO2: 21
TCO2: 25

## 2010-10-24 LAB — TSH: TSH: 2.172

## 2010-11-06 ENCOUNTER — Ambulatory Visit
Admission: RE | Admit: 2010-11-06 | Discharge: 2010-11-06 | Disposition: A | Discharge status: Home / Self Care | Payer: Medicaid Other | Source: Ambulatory Visit | Attending: Internal Medicine | Admitting: Internal Medicine

## 2010-11-06 DIAGNOSIS — N644 Mastodynia: Secondary | ICD-10-CM

## 2010-11-09 ENCOUNTER — Other Ambulatory Visit: Payer: Self-pay | Admitting: Gastroenterology

## 2010-11-13 ENCOUNTER — Ambulatory Visit
Admission: RE | Admit: 2010-11-13 | Discharge: 2010-11-13 | Disposition: A | Discharge status: Home / Self Care | Payer: Medicaid Other | Source: Ambulatory Visit | Attending: Gastroenterology | Admitting: Gastroenterology

## 2011-01-03 ENCOUNTER — Ambulatory Visit
Admission: RE | Admit: 2011-01-03 | Discharge: 2011-01-03 | Disposition: A | Discharge status: Home / Self Care | Payer: No Typology Code available for payment source | Source: Ambulatory Visit | Attending: Infectious Diseases | Admitting: Infectious Diseases

## 2011-01-03 ENCOUNTER — Other Ambulatory Visit: Payer: Self-pay | Admitting: Infectious Diseases

## 2011-01-03 DIAGNOSIS — R7611 Nonspecific reaction to tuberculin skin test without active tuberculosis: Secondary | ICD-10-CM

## 2011-01-08 ENCOUNTER — Other Ambulatory Visit (HOSPITAL_COMMUNITY): Payer: Self-pay | Admitting: Family Medicine

## 2011-01-08 DIAGNOSIS — R140 Abdominal distension (gaseous): Secondary | ICD-10-CM

## 2011-01-10 ENCOUNTER — Ambulatory Visit (HOSPITAL_COMMUNITY)
Admission: RE | Admit: 2011-01-10 | Discharge: 2011-01-10 | Disposition: A | Discharge status: Home / Self Care | Payer: Medicaid Other | Source: Ambulatory Visit | Attending: Family Medicine | Admitting: Family Medicine

## 2011-01-10 DIAGNOSIS — R109 Unspecified abdominal pain: Secondary | ICD-10-CM | POA: Insufficient documentation

## 2011-01-10 DIAGNOSIS — R140 Abdominal distension (gaseous): Secondary | ICD-10-CM

## 2011-01-10 DIAGNOSIS — N329 Bladder disorder, unspecified: Secondary | ICD-10-CM | POA: Insufficient documentation

## 2011-01-10 DIAGNOSIS — R112 Nausea with vomiting, unspecified: Secondary | ICD-10-CM | POA: Insufficient documentation

## 2011-01-10 DIAGNOSIS — R509 Fever, unspecified: Secondary | ICD-10-CM | POA: Insufficient documentation

## 2011-01-10 DIAGNOSIS — R197 Diarrhea, unspecified: Secondary | ICD-10-CM | POA: Insufficient documentation

## 2011-01-10 DIAGNOSIS — R634 Abnormal weight loss: Secondary | ICD-10-CM | POA: Insufficient documentation

## 2011-01-10 MED ORDER — IOHEXOL 300 MG/ML  SOLN
100.0000 mL | Freq: Once | INTRAMUSCULAR | Status: AC | PRN
  Administered 2011-01-10: 100 mL via INTRAVENOUS

## 2011-01-11 LAB — HEMOCCULT SLIDES (X 3 CARDS)

## 2011-03-05 ENCOUNTER — Other Ambulatory Visit: Payer: Self-pay | Admitting: Gastroenterology

## 2011-03-05 DIAGNOSIS — R1011 Right upper quadrant pain: Secondary | ICD-10-CM

## 2011-03-09 ENCOUNTER — Ambulatory Visit
Admission: RE | Admit: 2011-03-09 | Discharge: 2011-03-09 | Disposition: A | Discharge status: Home / Self Care | Payer: Medicaid Other | Source: Ambulatory Visit | Attending: Gastroenterology | Admitting: Gastroenterology

## 2011-03-09 DIAGNOSIS — R1011 Right upper quadrant pain: Secondary | ICD-10-CM

## 2011-04-06 ENCOUNTER — Other Ambulatory Visit (HOSPITAL_COMMUNITY): Payer: Self-pay | Admitting: Family Medicine

## 2011-04-06 DIAGNOSIS — M545 Low back pain, unspecified: Secondary | ICD-10-CM

## 2011-04-11 ENCOUNTER — Inpatient Hospital Stay (HOSPITAL_COMMUNITY): Admission: RE | Admit: 2011-04-11 | Payer: Medicaid Other | Source: Ambulatory Visit

## 2011-04-24 ENCOUNTER — Ambulatory Visit: Payer: Medicaid Other | Admitting: Physical Therapy

## 2011-04-30 ENCOUNTER — Ambulatory Visit: Payer: Medicaid Other | Admitting: Physical Therapy

## 2011-05-01 ENCOUNTER — Ambulatory Visit: Payer: Medicaid Other | Attending: Family Medicine | Admitting: Physical Therapy

## 2011-05-01 DIAGNOSIS — IMO0001 Reserved for inherently not codable concepts without codable children: Secondary | ICD-10-CM | POA: Insufficient documentation

## 2011-05-01 DIAGNOSIS — M2569 Stiffness of other specified joint, not elsewhere classified: Secondary | ICD-10-CM | POA: Insufficient documentation

## 2011-05-01 DIAGNOSIS — M545 Low back pain, unspecified: Secondary | ICD-10-CM | POA: Insufficient documentation

## 2011-05-10 ENCOUNTER — Other Ambulatory Visit (HOSPITAL_COMMUNITY): Payer: Self-pay | Admitting: Gastroenterology

## 2011-05-10 ENCOUNTER — Ambulatory Visit: Payer: Medicaid Other | Admitting: Physical Therapy

## 2011-05-10 DIAGNOSIS — R112 Nausea with vomiting, unspecified: Secondary | ICD-10-CM

## 2011-05-16 ENCOUNTER — Ambulatory Visit (HOSPITAL_COMMUNITY): Payer: Medicaid Other

## 2011-05-16 ENCOUNTER — Ambulatory Visit: Payer: Medicaid Other | Admitting: Physical Therapy

## 2011-06-07 ENCOUNTER — Other Ambulatory Visit (HOSPITAL_COMMUNITY): Payer: Self-pay | Admitting: Gastroenterology

## 2011-06-07 DIAGNOSIS — R112 Nausea with vomiting, unspecified: Secondary | ICD-10-CM

## 2011-06-20 ENCOUNTER — Encounter (HOSPITAL_COMMUNITY): Payer: Self-pay

## 2011-06-20 ENCOUNTER — Encounter (HOSPITAL_COMMUNITY)
Admission: RE | Admit: 2011-06-20 | Discharge: 2011-06-20 | Disposition: A | Discharge status: Home / Self Care | Payer: Medicaid Other | Source: Ambulatory Visit | Attending: Gastroenterology | Admitting: Gastroenterology

## 2011-06-20 DIAGNOSIS — R6881 Early satiety: Secondary | ICD-10-CM | POA: Insufficient documentation

## 2011-06-20 DIAGNOSIS — R112 Nausea with vomiting, unspecified: Secondary | ICD-10-CM | POA: Insufficient documentation

## 2011-06-20 MED ORDER — TECHNETIUM TC 99M SULFUR COLLOID
2.2000 | Freq: Once | INTRAVENOUS | Status: AC | PRN
  Administered 2011-06-20: 2.2 via INTRAVENOUS

## 2011-09-25 ENCOUNTER — Emergency Department (HOSPITAL_COMMUNITY)
Admission: EM | Admit: 2011-09-25 | Discharge: 2011-09-25 | Disposition: A | Discharge status: Home / Self Care | Payer: Medicaid Other | Source: Home / Self Care | Attending: Emergency Medicine | Admitting: Emergency Medicine

## 2011-09-25 ENCOUNTER — Encounter (HOSPITAL_COMMUNITY): Payer: Self-pay

## 2011-09-25 ENCOUNTER — Emergency Department (INDEPENDENT_AMBULATORY_CARE_PROVIDER_SITE_OTHER): Payer: Medicaid Other

## 2011-09-25 DIAGNOSIS — M545 Low back pain, unspecified: Secondary | ICD-10-CM

## 2011-09-25 DIAGNOSIS — K219 Gastro-esophageal reflux disease without esophagitis: Secondary | ICD-10-CM

## 2011-09-25 DIAGNOSIS — R059 Cough, unspecified: Secondary | ICD-10-CM

## 2011-09-25 DIAGNOSIS — R05 Cough: Secondary | ICD-10-CM

## 2011-09-25 HISTORY — DX: Unspecified asthma, uncomplicated: J45.909

## 2011-09-25 MED ORDER — MELOXICAM 7.5 MG PO TABS: 7.5000 mg | ORAL_TABLET | Freq: Every day | ORAL | Status: DC

## 2011-09-25 MED ORDER — TIZANIDINE HCL 4 MG PO TABS: 4.0000 mg | ORAL_TABLET | Freq: Two times a day (BID) | ORAL | Status: AC

## 2011-09-25 MED ORDER — TRAMADOL HCL 50 MG PO TABS: ORAL_TABLET | ORAL | Status: AC

## 2011-09-25 MED ORDER — DEXLANSOPRAZOLE 60 MG PO CPDR: 60.0000 mg | DELAYED_RELEASE_CAPSULE | Freq: Every day | ORAL | Status: DC

## 2011-09-25 NOTE — ED Provider Notes (Signed)
History     CSN: [id number]  Arrival date & time 09/25/11  1116   First MD Initiated Contact with Patient 09/25/11 1121      Chief Complaint  Patient presents with  . Cough    (Consider location/radiation/quality/duration/timing/severity/associated sxs/prior treatment) HPI Comments: Patient presents with multiple complaints. First, patient reports a cough that is occasionally productive of whitish sputum for the past month. States exposure to pulmonary irritants such as  cold air, grass, smoke triggers episodes of coughing to the point where she feels that she is about to vomit. No posttussive emesis. Reports achy chest pain after coughing. Complains of substernal burning chest pain, worse when she lies down. This also triggers of episodes of coughing.  Reports waterbrash. States that she has lost some weight, but is not sure how much over the past month.  States her appetite is good. No hemoptysis. No wheezing, SOB, DOE. Questionable night sweats. She has a history of severe reflux, reactive airway disease. She was evaluated by Dr. Delford Field, pulmonology, last year for this, Chest x-ray was normal, spirometry with mild restriction. 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 She was started on dexilant 60 mg daily, Reglan, 10 mg 3 times a day. Interpreter states that dexilant was helpful but pt ran out months ago. She was then referred to Dr. Loreta Ave, GI for an EGD, but patient cannot remember what the results or recommendations were.  States that her symptoms today are similar to the symptoms that she had a year ago  Second, patient reports achy, "burning" bilateral lower back pain that radiates down to her feet. It is worse when going from lying to standing, and with walking. She's been using warm compresses without relief. It is better with sleeping. She's not tried any medications for  this. She's had this for 2 years, but it got worse 4 months ago. States she had some imaging done 2 years ago, none since. No nausea, vomiting, hematuria, urinary urgency, frequency, abdominal pain. Does not recall any history of trauma to her back. No urinary urgency, incontinence, saddle anesthesia. No history of cancer, pain worse at night, history of prolonged steroid use, history of IVDU, HIV diabetes.  ROS as noted in HPI. All other ROS negative.    Patient is a 56 y.o. female presenting with cough and back pain. The history is provided by the patient and a relative. The history is limited by a language barrier. A language interpreter was used.  Cough This is a chronic problem. The current episode started more than 1 week ago. The problem has been gradually worsening. The cough is productive of sputum. There has been no fever. Associated symptoms include weight loss. Pertinent negatives include no chest pain, no chills, no sweats, no headaches, no rhinorrhea, no sore throat, no myalgias, no shortness of breath and no wheezing. She has tried nothing for the symptoms. The treatment provided no relief. She is not a smoker.  Back Pain  This is a chronic problem. The current episode started more than 1 week ago. The problem occurs daily. The problem has been gradually worsening. The pain is associated with no known injury. The pain is present in the lumbar spine and sacro-iliac joint. The quality of the pain is described as burning and shooting. The pain radiates to the right thigh, right knee, right foot, left thigh, left knee and left foot. The  symptoms are aggravated by bending. The pain is worse during the day. Associated symptoms include weight loss, leg pain and tingling. Pertinent negatives include no chest pain, no fever, no numbness, no headaches, no abdominal pain, no bowel incontinence, no perianal numbness, no bladder incontinence, no dysuria, no pelvic pain, no paresthesias, no paresis and no  weakness. She has tried heat for the symptoms. The treatment provided no relief.    Past Medical History  Diagnosis Date  . Obstructive sleep apnea (adult) (pediatric)   . Unspecified vitamin D deficiency   . Edema   . Other malaise and fatigue   . Allergic rhinitis, cause unspecified   . Unspecified asthma   . Arthropathy, unspecified, other specified sites   . Scoliosis (and kyphoscoliosis), idiopathic   . 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 Esophageal reflux   . Asymptomatic varicose veins   . Lumbago   . Pain in joint, shoulder region   . Cervicalgia   . Bronchitis   . Reactive airway disease     History reviewed. No pertinent past surgical history.  History reviewed. No pertinent family history.  History  Substance Use Topics  . Smoking status: Never Smoker   . Smokeless tobacco: Never Used  . Alcohol Use: No    OB History    Grav Para Term Preterm Abortions TAB SAB Ect Mult Living                  Review of Systems  Constitutional: Positive for weight loss. Negative for fever and chills.  HENT: Negative for sore throat and rhinorrhea.   Respiratory: Positive for cough. Negative for shortness of breath and wheezing.   Cardiovascular: Negative for chest pain.  Gastrointestinal: Negative for abdominal pain and bowel incontinence.  Genitourinary: Negative for bladder incontinence, dysuria and pelvic pain.  Musculoskeletal: Positive for back pain. Negative for myalgias.  Neurological: Positive for tingling. Negative for weakness, numbness, headaches and paresthesias.    Allergies  Review of patient's allergies indicates no known allergies.  Home Medications   Current Outpatient Rx  Name Route Sig Dispense Refill  . DOXEPIN HCL 10 MG PO CAPS Oral Take 10 mg by mouth.    Marland Kitchen VITAMIN B-12 1000 MCG PO TABS Oral Take 1,000 mcg by mouth daily.    . ALBUTEROL SULFATE HFA 108 (90 BASE) MCG/ACT IN AERS Inhalation Inhale 2  puffs into the lungs every 4 (four) hours as needed. For cough or SOB    . DEXLANSOPRAZOLE 60 MG PO CPDR Oral Take 1 capsule (60 mg total) by mouth daily. 30 capsule 4  . DEXLANSOPRAZOLE 60 MG PO CPDR Oral Take 1 capsule (60 mg total) by mouth daily. 30 capsule 0  . HYDROXYZINE HCL 10 MG PO TABS Oral Take 10 mg by mouth every 6 (six) hours as needed.      . MELOXICAM 7.5 MG PO TABS Oral Take 1 tablet (7.5 mg total) by mouth daily. 14 tablet 0  . SERTRALINE HCL 50 MG PO TABS Oral Take 50 mg by mouth daily.      Marland Kitchen TIZANIDINE HCL 4 MG PO TABS Oral Take 1 tablet (4 mg total) by mouth 2 (two) times daily. 1-2 tabs  bid 30 tablet 0  . TRAMADOL HCL 50 MG PO TABS  1-2 tabs po q 6 hr prn pain Maximum dose= 8 tablets per day 20 tablet 0    BP 124/81  Pulse 78  Temp 98.9 F (37.2  C) (Oral)  Resp 18  SpO2 98%  Physical Exam  Nursing note and vitals reviewed. Constitutional: She is oriented to person, place, and time. She appears well-developed and well-nourished.  HENT:  Head: Normocephalic and atraumatic.  Eyes: Conjunctivae and EOM are normal.  Neck: Normal range of motion.  Cardiovascular: Normal rate, regular rhythm, normal heart sounds and intact distal pulses.   No murmur heard. Pulmonary/Chest: Effort normal and breath sounds normal.       Diffuse chest wall tenderness  Abdominal: Soft. Normal appearance and bowel sounds are normal. She exhibits no distension. There is no tenderness. There is no rebound, no guarding and no CVA tenderness.  Musculoskeletal: Normal range of motion. She exhibits no edema and no tenderness.       Lumbar back: She exhibits tenderness and bony tenderness. She exhibits normal range of motion.       Back:       Bilateral paralumbar tenderness. Tenderness over lumbar spine, SI joints see drawing. No CVA tenderness. Bilateral lower extremities nontender. PT 2+ bilaterally.  No pain with PROM hips bilaterally. SLR pos bilaterally. Sensation baseline light touch  bilaterally for Pt, DTR's symmetric and intact bilaterally KJ, Motor symmetric bilateral 5/5 hip flexion, quadriceps, hamstrings, EHL, foot dorsiflexion, foot plantarflexion, gait somewhat antalgic but without apparent new ataxia.   Neurological: She is alert and oriented to person, place, and time.  Skin: Skin is warm and dry.  Psychiatric: She has a normal mood and affect. Her behavior is normal. Judgment and thought content normal.    ED Course  Procedures (including critical care time)  Labs Reviewed - No data to display Dg Chest 2 View  09/25/2011  *RADIOLOGY REPORT*  Clinical Data: Cough and chest pain.  CHEST - 2 VIEW  Comparison: Chest x-ray 01/03/2011.  Findings: Lung volumes are normal.  No consolidative airspace disease.  No pleural effusions.  Pulmonary vasculature is normal. Heart size is borderline enlarged.  Mediastinal contours are unremarkable.  IMPRESSION: 1.  No radiographic evidence of acute cardiopulmonary disease.   Original Report Authenticated By: Florencia Reasons, M.D.    Dg Lumbar Spine Complete  09/25/2011  *RADIOLOGY REPORT*  Clinical Data: Low back pain for several years, worsening over the last 4 months.  LUMBAR SPINE - COMPLETE 4+ VIEW  Comparison: Lumbar spine radiographs 08/18/2007.  Findings: There are five lumbar type vertebral bodies.  A convex left scoliosis appears stable.  There is mild facet disease inferiorly.  No acute fracture or pars defect is seen.  IMPRESSION: Stable scoliosis and mild spondylosis.  No acute osseous findings.   Original Report Authenticated By: Gerrianne Scale, M.D.      1. Lumbago   2. GERD (gastroesophageal reflux disease)   3. Cough      MDM  Previous records reviewed. Additional medical history obtained.   Patient with normal gastric emptying scan in May 2013.  X-rays reviewed by myself. No acute changes. Full report per radiology  Will check chest x-ray as patient is reporting unintentional weight loss, although  feel that her symptoms are more reactive airway disease from reflux. Doubt ACS.  If CXR negative, we'll restart her on an Dexilant. Do not suspect UTI, nephrolithiasis. Since her symptoms have been going on for more than 4 months, we'll recheck a lumbar film. Otherwise, no evidence of spinal cord involvement. Suspect that this is an exacerbation of chronic back pain. Will refer her to breakthrough physical therapy and refer her to Dr. Ave Filter, ortho on  call. Discussed imaging, and plan with patient and caregiver. Discussed signs and symptoms that should prompt return to department. They agree with plan.  Luiz Blare, MD 09/25/11 2223

## 2011-09-25 NOTE — ED Notes (Signed)
Reportedly having cough for past 2 moths; has to cough w exposure to cut grass , smoke, air conditioning; NAD at present, but reportedly coughs to point of wanting to vomit

## 2011-09-28 ENCOUNTER — Telehealth (HOSPITAL_COMMUNITY): Payer: Self-pay | Admitting: *Deleted

## 2011-09-28 NOTE — ED Notes (Signed)
8/28 Alvino Chapel the pharmacist @ [facility] on [address] called on VM and said the Tizanidine has 2 different sets of instructions.  8/29  Rx. printed and shown to Dr. Ladon Applebaum and he said just take 1 tablet two times daily. I called Alvino Chapel back and told her. Vassie Moselle 09/28/2011

## 2011-10-29 ENCOUNTER — Emergency Department (HOSPITAL_COMMUNITY)
Admission: EM | Admit: 2011-10-29 | Discharge: 2011-10-29 | Disposition: A | Discharge status: Home / Self Care | Payer: Medicaid Other | Source: Home / Self Care | Attending: Family Medicine | Admitting: Family Medicine

## 2011-10-29 ENCOUNTER — Emergency Department (INDEPENDENT_AMBULATORY_CARE_PROVIDER_SITE_OTHER): Payer: Medicaid Other

## 2011-10-29 ENCOUNTER — Encounter (HOSPITAL_COMMUNITY): Payer: Self-pay | Admitting: *Deleted

## 2011-10-29 DIAGNOSIS — S161XXA Strain of muscle, fascia and tendon at neck level, initial encounter: Secondary | ICD-10-CM

## 2011-10-29 DIAGNOSIS — M25519 Pain in unspecified shoulder: Secondary | ICD-10-CM

## 2011-10-29 DIAGNOSIS — M25512 Pain in left shoulder: Secondary | ICD-10-CM

## 2011-10-29 DIAGNOSIS — M542 Cervicalgia: Secondary | ICD-10-CM

## 2011-10-29 DIAGNOSIS — S139XXA Sprain of joints and ligaments of unspecified parts of neck, initial encounter: Secondary | ICD-10-CM

## 2011-10-29 MED ORDER — TRAMADOL HCL 50 MG PO TABS: 50.0000 mg | ORAL_TABLET | Freq: Four times a day (QID) | ORAL | Status: DC | PRN

## 2011-10-29 MED ORDER — MELOXICAM 7.5 MG PO TABS: 7.5000 mg | ORAL_TABLET | Freq: Every day | ORAL | Status: DC

## 2011-10-29 MED ORDER — DIAZEPAM 5 MG PO TABS: 5.0000 mg | ORAL_TABLET | Freq: Two times a day (BID) | ORAL | Status: DC

## 2011-10-29 MED ORDER — METHYLPREDNISOLONE 4 MG PO KIT: PACK | ORAL | Status: DC

## 2011-10-29 MED ORDER — DEXLANSOPRAZOLE 60 MG PO CPDR: 60.0000 mg | DELAYED_RELEASE_CAPSULE | Freq: Every day | ORAL | Status: DC

## 2011-10-29 NOTE — ED Notes (Signed)
Pain  Scale  Of  6  Upper  Back  And  Neck

## 2011-10-29 NOTE — ED Provider Notes (Signed)
History     CSN: [id number]  Arrival date & time 10/29/11  1214   First MD Initiated Contact with Patient 10/29/11 1351      Chief Complaint  Patient presents with  . Back Pain    (Consider location/radiation/quality/duration/timing/severity/associated sxs/prior treatment) Patient is a 56 y.o. female presenting with back pain. The history is provided by the patient and a relative.  Back Pain    Kayla Davies is a 56 y.o. female who complains of right neck pain radiating up to right head and into right shoulder for one month.  Pain increasingly worse over the past 2 weeks, described as sharp in nature, constant, rated 9/10.  Has not taken medication for pain but has used massage therapy with minimal relief.  States no prior history of same but does have problems with low back pain. Not currently employed, denies heavy lifting or strenuous exercise.  Pain is worse with movement and palpation, states today wind blowing across shoulder increased pain.   Past Medical History  Diagnosis Date  . Obstructive sleep apnea (adult) (pediatric)   . Unspecified vitamin D deficiency   . Edema   . Other malaise and fatigue   . Allergic rhinitis, cause unspecified   . Unspecified asthma   . Arthropathy, unspecified, other specified sites   . Scoliosis (and kyphoscoliosis), idiopathic   . 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 Esophageal reflux   . Asymptomatic varicose veins   . Lumbago   . Pain in joint, shoulder region   . Cervicalgia   . Bronchitis   . Reactive airway disease     History reviewed. No pertinent past surgical history.  No family history on file.  History  Substance Use Topics  . Smoking status: Never Smoker   . Smokeless tobacco: Never Used  . Alcohol Use: No    OB History    Grav Para Term Preterm Abortions TAB SAB Ect Mult Living                  Review of Systems  Constitutional: Negative.   HENT: Negative.     Respiratory: Negative.   Cardiovascular: Negative.   Musculoskeletal: Positive for back pain and arthralgias. Negative for myalgias, joint swelling and gait problem.  Skin: Negative.   Neurological: Negative.     Allergies  Review of patient's allergies indicates no known allergies.  Home Medications   Current Outpatient Rx  Name Route Sig Dispense Refill  . ALBUTEROL SULFATE HFA 108 (90 BASE) MCG/ACT IN AERS Inhalation Inhale 2 puffs into the lungs every 4 (four) hours as needed. For cough or SOB    . DEXLANSOPRAZOLE 60 MG PO CPDR Oral Take 1 capsule (60 mg total) by mouth daily. 30 capsule 0  . DIAZEPAM 5 MG PO TABS Oral Take 1 tablet (5 mg total) by mouth 2 (two) times daily. 10 tablet 0  . DOXEPIN HCL 10 MG PO CAPS Oral Take 10 mg by mouth.    Marland Kitchen HYDROXYZINE HCL 10 MG PO TABS Oral Take 10 mg by mouth every 6 (six) hours as needed.      . MELOXICAM 7.5 MG PO TABS Oral Take 1 tablet (7.5 mg total) by mouth daily. 30 tablet 1  . METHYLPREDNISOLONE 4 MG PO KIT  follow package directions 21 tablet 0  . SERTRALINE HCL 50 MG PO TABS Oral Take 50 mg by mouth daily.      . TRAMADOL HCL 50 MG  PO TABS Oral Take 1 tablet (50 mg total) by mouth every 6 (six) hours as needed for pain. 15 tablet 0  . VITAMIN B-12 1000 MCG PO TABS Oral Take 1,000 mcg by mouth daily.      BP 103/67  Pulse 84  Temp 98 F (36.7 C) (Oral)  Resp 20  SpO2 99%  Physical Exam  Nursing note and vitals reviewed. Constitutional: She is oriented to person, place, and time. Vital signs are normal. She appears well-developed and well-nourished. She is active and cooperative.  HENT:  Head: Normocephalic.  Eyes: Conjunctivae normal are normal. Pupils are equal, round, and reactive to light. No scleral icterus.  Neck: Trachea normal. Neck supple.  Cardiovascular: Normal rate and regular rhythm.   Pulmonary/Chest: Effort normal and breath sounds normal.  Musculoskeletal:       Right shoulder: Normal.       Left  shoulder: Normal.       Cervical back: She exhibits decreased range of motion, tenderness and spasm. She exhibits no bony tenderness, no swelling, no edema, no deformity and no pain.       Thoracic back: She exhibits tenderness. She exhibits normal range of motion, no bony tenderness, no swelling, no edema, no deformity and no spasm.       Back:  Neurological: She is alert and oriented to person, place, and time. She has normal strength and normal reflexes. No cranial nerve deficit or sensory deficit. Coordination and gait normal. GCS eye subscore is 4. GCS verbal subscore is 5. GCS motor subscore is 6.  Skin: Skin is warm and dry.  Psychiatric: She has a normal mood and affect. Her speech is normal and behavior is normal. Judgment and thought content normal. Cognition and memory are normal.    ED Course  Procedures (including critical care time)  Labs Reviewed - No data to display Dg Cervical Spine Complete  10/29/2011  *RADIOLOGY REPORT*  Clinical Data: Neck pain.  No known injury.  CERVICAL SPINE - COMPLETE 4+ VIEW  Comparison: Cervical spine MR dated 08/16/2009.  Findings: Interval accentuated lordosis in the upper cervical spine with interval 4 mm of retrolisthesis at the C3-4 level.  Bilateral uncinate spurs at multiple levels without significant foraminal stenosis.  Mild cervicothoracic scoliosis.  IMPRESSION: Scoliosis and degenerative changes, as described above.   Original Report Authenticated By: Darrol Angel, M.D.      1. Posterolateral cervical muscle strain   2. Neck pain   3. Left shoulder pain       MDM  Continue heat application and massage therapy.  Take medications as prescribed.  Follow up with primary care provider for management of musculoskeletal complaints.  May follow up with orthopedist if symptoms are not improved.         Johnsie Kindred, NP 10/29/11 1507

## 2011-10-29 NOTE — ED Notes (Signed)
Pt  Has  Symptoms  Of     Upper  Back       Both  Upper  Arm    Pain   X  sev  Weeks       Associated         With     Cough  As  Well  Pt  Speaks  No  English  But   Interpretor  Is  Present at  Bedside        Pt  Ambulated  To  Room  With a  Steady fluid gait   Appears in no sevre distress  Skin is warm and  Dry  Capillary refill  Is  Brisk

## 2011-10-29 NOTE — ED Provider Notes (Signed)
Medical screening examination/treatment/procedure(s) were performed by resident physician or non-physician practitioner and as supervising physician I was immediately available for consultation/collaboration.   KINDL,JAMES DOUGLAS MD.    James D Kindl, MD 10/29/11 2122 

## 2011-11-15 ENCOUNTER — Emergency Department (HOSPITAL_COMMUNITY)
Admission: EM | Admit: 2011-11-15 | Discharge: 2011-11-15 | Disposition: A | Discharge status: Home / Self Care | Payer: Medicaid Other | Source: Home / Self Care | Attending: Emergency Medicine | Admitting: Emergency Medicine

## 2011-11-15 ENCOUNTER — Other Ambulatory Visit: Payer: Self-pay | Admitting: Internal Medicine

## 2011-11-15 ENCOUNTER — Encounter (HOSPITAL_COMMUNITY): Payer: Self-pay | Admitting: Emergency Medicine

## 2011-11-15 ENCOUNTER — Other Ambulatory Visit: Payer: Self-pay | Admitting: Emergency Medicine

## 2011-11-15 DIAGNOSIS — N644 Mastodynia: Secondary | ICD-10-CM

## 2011-11-15 DIAGNOSIS — Z1231 Encounter for screening mammogram for malignant neoplasm of breast: Secondary | ICD-10-CM

## 2011-11-15 DIAGNOSIS — N6325 Unspecified lump in the left breast, overlapping quadrants: Secondary | ICD-10-CM

## 2011-11-15 MED ORDER — TRAMADOL HCL 50 MG PO TABS: 50.0000 mg | ORAL_TABLET | Freq: Four times a day (QID) | ORAL | Status: DC | PRN

## 2011-11-15 MED ORDER — DEXLANSOPRAZOLE 60 MG PO CPDR: 60.0000 mg | DELAYED_RELEASE_CAPSULE | Freq: Every day | ORAL | Status: DC

## 2011-11-15 NOTE — ED Notes (Addendum)
Pt c/o ha and body stiffness. Pain in head radiates to left. Pt says that she is fatigue and has dizziness  C/o left breast lump x 4 months but now has become red and burning sensation x 2 weeks.   Needs referral to breast center

## 2011-11-15 NOTE — ED Provider Notes (Signed)
History     CSN: [id number]  Arrival date & time 11/15/11  1130   First MD Initiated Contact with Patient 11/15/11 1250      Chief Complaint  Patient presents with  . Fatigue  . Breast Pain    (Consider location/radiation/quality/duration/timing/severity/associated sxs/prior treatment) HPI  Left breast pain for four months, intermittent, in past week worsening, sharp in nature, worse with palpaton.  Denies history of same, called today for appointment for mammogram and general breast exam.  Was instructed to come to urgent care for exam since she does not currently have a primary care provider, needs referral.  Pt additionally request refill of medications, has not been able to secure primary care provider-[facility]-closed in July. Past Medical History  Diagnosis Date  . Obstructive sleep apnea (adult) (pediatric)   . Unspecified vitamin D deficiency   . Edema   . Other malaise and fatigue   . Allergic rhinitis, cause unspecified   . Unspecified asthma   . Arthropathy, unspecified, other specified sites   . Scoliosis (and kyphoscoliosis), idiopathic   . 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 Esophageal reflux   . Asymptomatic varicose veins   . Lumbago   . Pain in joint, shoulder region   . Cervicalgia   . Bronchitis   . Reactive airway disease     History reviewed. No pertinent past surgical history.  History reviewed. No pertinent family history.  History  Substance Use Topics  . Smoking status: Never Smoker   . Smokeless tobacco: Never Used  . Alcohol Use: No    OB History    Grav Para Term Preterm Abortions TAB SAB Ect Mult Living                  Review of Systems  Allergies  Review of patient's allergies indicates no known allergies.  Home Medications   Current Outpatient Rx  Name Route Sig Dispense Refill  . ALBUTEROL SULFATE HFA 108 (90 BASE) MCG/ACT IN AERS Inhalation  Inhale 2 puffs into the lungs every 4 (four) hours as needed. For cough or SOB    . DEXLANSOPRAZOLE 60 MG PO CPDR Oral Take 1 capsule (60 mg total) by mouth daily. 30 capsule 3  . DIAZEPAM 5 MG PO TABS Oral Take 1 tablet (5 mg total) by mouth 2 (two) times daily. 10 tablet 0  . DOXEPIN HCL 10 MG PO CAPS Oral Take 10 mg by mouth.    Marland Kitchen HYDROXYZINE HCL 10 MG PO TABS Oral Take 10 mg by mouth every 6 (six) hours as needed.      . MELOXICAM 7.5 MG PO TABS Oral Take 1 tablet (7.5 mg total) by mouth daily. 30 tablet 1  . METHYLPREDNISOLONE 4 MG PO KIT  follow package directions 21 tablet 0  . SERTRALINE HCL 50 MG PO TABS Oral Take 50 mg by mouth daily.      . TRAMADOL HCL 50 MG PO TABS Oral Take 1 tablet (50 mg total) by mouth every 6 (six) hours as needed for pain. 30 tablet 0  . VITAMIN B-12 1000 MCG PO TABS Oral Take 1,000 mcg by mouth daily.      BP 131/94  Pulse 91  Temp 98.3 F (36.8 C) (Oral)  Resp 24  SpO2 100%  Physical Exam  Nursing note and vitals reviewed. Constitutional: She is oriented to person, place, and time. Vital signs are normal. She appears well-developed and  well-nourished. She is active and cooperative.  HENT:  Head: Normocephalic.  Eyes: Conjunctivae normal are normal. Pupils are equal, round, and reactive to light. No scleral icterus.  Neck: Trachea normal. Neck supple.  Cardiovascular: Normal rate and regular rhythm.   Pulmonary/Chest: Effort normal and breath sounds normal. She exhibits no mass. Right breast exhibits no inverted nipple, no mass, no nipple discharge, no skin change and no tenderness. Left breast exhibits tenderness. Left breast exhibits no inverted nipple, no mass, no nipple discharge and no skin change. Breasts are symmetrical.    Lymphadenopathy:    She has no axillary adenopathy.  Neurological: She is alert and oriented to person, place, and time. No cranial nerve deficit or sensory deficit.  Skin: Skin is warm and dry.  Psychiatric: She has a  normal mood and affect. Her speech is normal and behavior is normal. Judgment and thought content normal. Cognition and memory are normal.    ED Course  Procedures (including critical care time)  Labs Reviewed - No data to display No results found.   1. Breast pain, left       MDM  Follow up with breast center for mammogram.  rtc as needed.  Resources provided for primary care provider.          Johnsie Kindred, NP 11/15/11 1422

## 2011-11-16 ENCOUNTER — Ambulatory Visit
Admission: RE | Admit: 2011-11-16 | Discharge: 2011-11-16 | Disposition: A | Discharge status: Home / Self Care | Payer: Medicaid Other | Source: Ambulatory Visit | Attending: Emergency Medicine | Admitting: Emergency Medicine

## 2011-11-16 DIAGNOSIS — N6325 Unspecified lump in the left breast, overlapping quadrants: Secondary | ICD-10-CM

## 2011-11-16 DIAGNOSIS — N644 Mastodynia: Secondary | ICD-10-CM

## 2011-11-16 NOTE — ED Provider Notes (Signed)
Medical screening examination/treatment/procedure(s) were performed by non-physician practitioner and as supervising physician I was immediately available for consultation/collaboration.  Leslee Home, M.D.   Reuben Likes, MD 11/16/11 0130

## 2011-11-19 ENCOUNTER — Other Ambulatory Visit: Payer: Medicaid Other

## 2011-12-18 ENCOUNTER — Encounter (HOSPITAL_COMMUNITY): Payer: Self-pay | Admitting: *Deleted

## 2011-12-18 ENCOUNTER — Emergency Department (HOSPITAL_COMMUNITY)
Admission: EM | Admit: 2011-12-18 | Discharge: 2011-12-18 | Disposition: A | Discharge status: Home / Self Care | Payer: Medicaid Other | Source: Home / Self Care

## 2011-12-18 DIAGNOSIS — R42 Dizziness and giddiness: Secondary | ICD-10-CM

## 2011-12-18 DIAGNOSIS — R5382 Chronic fatigue, unspecified: Secondary | ICD-10-CM

## 2011-12-18 DIAGNOSIS — R5383 Other fatigue: Secondary | ICD-10-CM

## 2011-12-18 DIAGNOSIS — M1990 Unspecified osteoarthritis, unspecified site: Secondary | ICD-10-CM

## 2011-12-18 DIAGNOSIS — R5381 Other malaise: Secondary | ICD-10-CM

## 2011-12-18 DIAGNOSIS — E860 Dehydration: Secondary | ICD-10-CM

## 2011-12-18 LAB — CBC
MCH: 29.2 pg (ref 26.0–34.0)
MCHC: 34.1 g/dL (ref 30.0–36.0)
MCV: 85.4 fL (ref 78.0–100.0)
Platelets: 224 10*3/uL (ref 150–400)
RDW: 12.9 % (ref 11.5–15.5)

## 2011-12-18 LAB — COMPREHENSIVE METABOLIC PANEL
AST: 18 U/L (ref 0–37)
Albumin: 3.9 g/dL (ref 3.5–5.2)
Calcium: 9.6 mg/dL (ref 8.4–10.5)
Creatinine, Ser: 0.64 mg/dL (ref 0.50–1.10)

## 2011-12-18 MED ORDER — ACETAMINOPHEN 325 MG PO TABS: 650.0000 mg | ORAL_TABLET | Freq: Four times a day (QID) | ORAL | Status: DC | PRN

## 2011-12-18 MED ORDER — PREGABALIN 75 MG PO CAPS: 75.0000 mg | ORAL_CAPSULE | Freq: Two times a day (BID) | ORAL | Status: DC

## 2011-12-18 MED ORDER — MECLIZINE HCL 25 MG PO TABS: 25.0000 mg | ORAL_TABLET | Freq: Three times a day (TID) | ORAL | Status: DC | PRN

## 2011-12-18 MED ORDER — TRAMADOL HCL 50 MG PO TABS: 50.0000 mg | ORAL_TABLET | Freq: Four times a day (QID) | ORAL | Status: DC | PRN

## 2011-12-18 NOTE — ED Provider Notes (Addendum)
History     CSN: [id number]  Arrival date & time 12/18/11  1050   None    Chief Complaint  Patient presents with  . Numbness  . Fatigue   Pt's daughter served as Nurse, learning disability for today's encounter   HPI Pt presents with multiple complaints.  Pt says that she is chronically fatigued and it is getting worse. She is having dizziness as well.  She has been diagnosed with vertigo.  She denies n/v/d.  She is reporting chronic arthritis pain and paresthesias in hands and feet and particularly her right 1st finger is painful in the DIP joint and reports that this has been present for last 3 years but it hurts all the time.  This started with a blister reaction and then lasting pain in the finger.  She had been given RX for Mobic but stopped it because of nausea and severe stomach upset.  Pt says that she is having pain in all joints as well.  She is more concerned about her finger and her fatigue at this time.  She says she has not had any recent blood work done.     Past Medical History  Diagnosis Date  . Obstructive sleep apnea (adult) (pediatric)   . Unspecified vitamin D deficiency   . Edema   . Other malaise and fatigue   . Allergic rhinitis, cause unspecified   . Unspecified asthma   . Arthropathy, unspecified, other specified sites   . Scoliosis (and kyphoscoliosis), idiopathic   . 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 Esophageal reflux   . Asymptomatic varicose veins   . Lumbago   . Pain in joint, shoulder region   . Cervicalgia   . Bronchitis   . Reactive airway disease     History reviewed. No pertinent past surgical history.  Family History  Problem Relation Age of Onset  . Family history unknown: Yes    History  Substance Use Topics  . Smoking status: Never Smoker   . Smokeless tobacco: Never Used  . Alcohol Use: No    OB History    Grav Para Term Preterm Abortions TAB SAB Ect Mult Living                  Review of  Systems  Constitutional: Positive for activity change, appetite change and fatigue. Negative for fever, chills, diaphoresis and unexpected weight change.  HENT: Negative.   Eyes: Negative.   Respiratory: Negative.   Cardiovascular: Negative.   Gastrointestinal: Negative.   Genitourinary: Negative.   Musculoskeletal: Positive for back pain, joint swelling and arthralgias.  Neurological: Positive for numbness.  Psychiatric/Behavioral: Negative.     Allergies  Review of patient's allergies indicates no known allergies.  Home Medications   Current Outpatient Rx  Name  Route  Sig  Dispense  Refill  . DOXEPIN HCL 10 MG PO CAPS   Oral   Take 10 mg by mouth.         Marland Kitchen VITAMIN B-12 1000 MCG PO TABS   Oral   Take 1,000 mcg by mouth daily.         . ALBUTEROL SULFATE HFA 108 (90 BASE) MCG/ACT IN AERS   Inhalation   Inhale 2 puffs into the lungs every 4 (four) hours as needed. For cough or SOB         . DEXLANSOPRAZOLE 60 MG PO CPDR   Oral   Take 1 capsule (60 mg total) by mouth daily.  30 capsule   3   . DIAZEPAM 5 MG PO TABS   Oral   Take 1 tablet (5 mg total) by mouth 2 (two) times daily.   10 tablet   0   . HYDROXYZINE HCL 10 MG PO TABS   Oral   Take 10 mg by mouth every 6 (six) hours as needed.           . MELOXICAM 7.5 MG PO TABS   Oral   Take 1 tablet (7.5 mg total) by mouth daily.   30 tablet   1   . METHYLPREDNISOLONE 4 MG PO KIT      follow package directions   21 tablet   0   . SERTRALINE HCL 50 MG PO TABS   Oral   Take 50 mg by mouth daily.           . TRAMADOL HCL 50 MG PO TABS   Oral   Take 1 tablet (50 mg total) by mouth every 6 (six) hours as needed for pain.   30 tablet   0     BP 117/81  Pulse 79  Temp 98.2 F (36.8 C) (Oral)  Resp 18  SpO2 98%  Physical Exam  Nursing note and vitals reviewed. Constitutional: She is oriented to person, place, and time. She appears well-developed and well-nourished. No distress.  HENT:    Head: Normocephalic and atraumatic.  Eyes: Conjunctivae normal are normal. Pupils are equal, round, and reactive to light. Right eye exhibits no discharge. Left eye exhibits no discharge. No scleral icterus.  Neck: Normal range of motion. Neck supple. No JVD present. No tracheal deviation present. No thyromegaly present.  Cardiovascular: Normal rate, regular rhythm and normal heart sounds.   No murmur heard. Pulmonary/Chest: No respiratory distress. She has no wheezes. She has no rales. She exhibits no tenderness.  Abdominal: Soft. Bowel sounds are normal.  Musculoskeletal: She exhibits tenderness. She exhibits no edema.       Right first DIP joint TTP, arthritic changes in hands seen, no arthritic changes or swelling of feet but tenderness to palpation.    Lymphadenopathy:    She has no cervical adenopathy.  Neurological: She is alert and oriented to person, place, and time. She has normal reflexes. No cranial nerve deficit.  Skin: Skin is warm and dry. No rash noted. No erythema. No pallor.  Psychiatric: She has a normal mood and affect.    ED Course  Procedures (including critical care time)  Labs Reviewed - No data to display No results found.   No diagnosis found.    MDM  Dizziness  Fatigue, chronic Arthritis Right 1st finger DIP pain - suspect pt had outbreak of shingles to the area and having postherpetic neuralgia pain   Ordered a lab panel of CBC, CMP, ESR, TSH, urinalysis Will follow results RX for gabapentin 300 mg po daily for 3 days, with instructions to titrate to 300mg  po tid,  Acetaminophen 650 mg po every 6 hours, tramadol prn pain, vertigo 25 mg prn dizziness, encouraged fluids, rest, follow labs,  RTC in 1 week for recheck    Cleora Fleet, MD, CDE, FAAFP [facility] [place], [place]    Cleora Fleet, MD 12/18/11 1549  Theodosia Quay Cyndie Mull, MD 12/19/11 1424

## 2011-12-18 NOTE — ED Notes (Signed)
Per daughter pt has numbness in toes and fingers for the past year and constantly feels tired and has dizzy episodes. Has been seen multiple times for same complaint.

## 2011-12-19 ENCOUNTER — Telehealth (HOSPITAL_COMMUNITY): Payer: Self-pay | Admitting: *Deleted

## 2011-12-19 LAB — TSH: TSH: 1.661 u[IU]/mL (ref 0.350–4.500)

## 2011-12-28 ENCOUNTER — Encounter (HOSPITAL_COMMUNITY): Payer: Self-pay

## 2011-12-28 ENCOUNTER — Emergency Department (INDEPENDENT_AMBULATORY_CARE_PROVIDER_SITE_OTHER)
Admission: EM | Admit: 2011-12-28 | Discharge: 2011-12-28 | Disposition: A | Discharge status: Home / Self Care | Payer: Medicaid Other | Source: Home / Self Care

## 2011-12-28 DIAGNOSIS — R42 Dizziness and giddiness: Secondary | ICD-10-CM

## 2011-12-28 DIAGNOSIS — N8111 Cystocele, midline: Secondary | ICD-10-CM

## 2011-12-28 DIAGNOSIS — R5381 Other malaise: Secondary | ICD-10-CM

## 2011-12-28 DIAGNOSIS — R5383 Other fatigue: Secondary | ICD-10-CM

## 2011-12-28 DIAGNOSIS — M129 Arthropathy, unspecified: Secondary | ICD-10-CM

## 2011-12-28 DIAGNOSIS — K219 Gastro-esophageal reflux disease without esophagitis: Secondary | ICD-10-CM

## 2011-12-28 MED ORDER — GABAPENTIN 300 MG PO CAPS: 300.0000 mg | ORAL_CAPSULE | Freq: Three times a day (TID) | ORAL | Status: DC

## 2011-12-28 NOTE — ED Provider Notes (Signed)
History     CSN: [id number]  Arrival date & time 12/28/11  1049   Chief Complaint  Patient presents with  . Dizziness    c/o dizzy and aches  . Follow-up   HPI Patient presents today to followup.  She reports that she's been taking the gabapentin and she has noticed that the tingling burning and paresthesia pain that she was having her finger hands and feet has started to diminish somewhat.  She is reporting that she's having more acid reflux symptoms dyspepsia when she takes the gabapentin tablets.  She reports that the chronic fatigue remains.  She reports that she has occasional headache but overall has improved somewhat.  She also reports that she occasionally gets nauseated if she misses any of her acid reflux medications.  She reports that she has not been able to attend her English language class because of her chronic conditions.  Past Medical History  Diagnosis Date  . Obstructive sleep apnea (adult) (pediatric)   . Unspecified vitamin D deficiency   . Edema   . Other malaise and fatigue   . Allergic rhinitis, cause unspecified   . Unspecified asthma   . Arthropathy, unspecified, other specified sites   . Scoliosis (and kyphoscoliosis), idiopathic   . 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 Esophageal reflux   . Asymptomatic varicose veins   . Lumbago   . Pain in joint, shoulder region   . Cervicalgia   . Bronchitis   . Reactive airway disease     History reviewed. No pertinent past surgical history.  No family history on file.  History  Substance Use Topics  . Smoking status: Never Smoker   . Smokeless tobacco: Never Used  . Alcohol Use: No    OB History    Grav Para Term Preterm Abortions TAB SAB Ect Mult Living                  Review of Systems  Constitutional: Positive for fatigue. Negative for fever and unexpected weight change.       Chronic poor appetite  HENT: Negative.   Eyes: Negative.   Respiratory:  Negative.   Cardiovascular: Negative.   Gastrointestinal: Negative.   Musculoskeletal: Positive for joint swelling and arthralgias.  Neurological: Positive for tremors and weakness. Negative for dizziness, seizures, syncope, facial asymmetry, speech difficulty, light-headedness, numbness and headaches.  Hematological: Negative.   Psychiatric/Behavioral: Negative.     Allergies  Review of patient's allergies indicates no known allergies.  Home Medications   Current Outpatient Rx  Name  Route  Sig  Dispense  Refill  . ACETAMINOPHEN 325 MG PO TABS   Oral   Take 2 tablets (650 mg total) by mouth every 6 (six) hours as needed for pain (joint pain).   60 tablet   0   . ALBUTEROL SULFATE HFA 108 (90 BASE) MCG/ACT IN AERS   Inhalation   Inhale 2 puffs into the lungs every 4 (four) hours as needed. For cough or SOB         . DEXLANSOPRAZOLE 60 MG PO CPDR   Oral   Take 1 capsule (60 mg total) by mouth daily.   30 capsule   3   . DIAZEPAM 5 MG PO TABS   Oral   Take 1 tablet (5 mg total) by mouth 2 (two) times daily.   10 tablet   0   . DOXEPIN HCL 10 MG PO CAPS   Oral  Take 10 mg by mouth.         Marland Kitchen HYDROXYZINE HCL 10 MG PO TABS   Oral   Take 10 mg by mouth every 6 (six) hours as needed.           Marland Kitchen MECLIZINE HCL 25 MG PO TABS   Oral   Take 1 tablet (25 mg total) by mouth 3 (three) times daily as needed for dizziness.   30 tablet   0   . PREGABALIN 75 MG PO CAPS   Oral   Take 1 capsule (75 mg total) by mouth 2 (two) times daily.   60 capsule   1   . SERTRALINE HCL 50 MG PO TABS   Oral   Take 50 mg by mouth daily.           . TRAMADOL HCL 50 MG PO TABS   Oral   Take 1 tablet (50 mg total) by mouth every 6 (six) hours as needed for pain.   30 tablet   0   . VITAMIN B-12 1000 MCG PO TABS   Oral   Take 1,000 mcg by mouth daily.           BP 104/73  Pulse 83  Temp 98.2 F (36.8 C) (Oral)  Resp 19  SpO2 96%  Physical Exam  Constitutional:  She is oriented to person, place, and time. She appears well-developed. No distress.       Very thin frail female  HENT:  Head: Normocephalic and atraumatic.  Eyes: EOM are normal. Pupils are equal, round, and reactive to light.  Neck: Normal range of motion. Neck supple. No JVD present. No thyromegaly present.  Cardiovascular: Normal rate, regular rhythm and normal heart sounds.   Pulmonary/Chest: Effort normal and breath sounds normal.  Abdominal: Soft. Bowel sounds are normal.  Musculoskeletal: Normal range of motion. She exhibits no edema.  Neurological: She is alert and oriented to person, place, and time.  Skin: Skin is warm and dry.  Psychiatric: She has a normal mood and affect. Her behavior is normal. Judgment and thought content normal.    ED Course  Procedures (including critical care time)  Labs Reviewed - No data to display No results found.   No diagnosis found.    MDM  IMPRESSION  Chronic fatigue syndrome  Painful neuropathy  Chronic dizziness and vertigo  RECOMMENDATIONS / PLAN  The patient is improving with gabapentin from the painful neuropathic symptoms.  She was encouraged to continue taking the gabapentin as prescribed.  She is to follow a strict antireflux diet.  She is supposed to take the medication with a meal to prevent some of the acid reflux symptoms.  We'll continue to follow her closely.  FOLLOW UP Three-month followup  The patient was given clear instructions to go to ER or return to medical center if symptoms don't improve, worsen or new problems develop.  The patient verbalized understanding.  The patient was told to call to get lab results if they haven't heard anything in the next week.     I completed a form for the patient today that she requested for medical certification for disability exception.  I reviewed [facility]       Cleora Fleet, MD 12/28/11 1910

## 2011-12-28 NOTE — ED Notes (Signed)
Patient here with daughter- states her mom is here for follow up visit to get results of blood work but states her mom has been complaining of feeling dizzy and body  Aches x 1 week

## 2012-01-07 ENCOUNTER — Emergency Department (HOSPITAL_COMMUNITY)
Admission: EM | Admit: 2012-01-07 | Discharge: 2012-01-07 | Disposition: A | Discharge status: Home / Self Care | Payer: Medicaid Other | Source: Home / Self Care

## 2012-01-07 ENCOUNTER — Encounter (HOSPITAL_COMMUNITY): Payer: Self-pay

## 2012-01-07 NOTE — ED Notes (Signed)
Here with daughter complain of vomiting while taking prescribed tramadol

## 2012-01-07 NOTE — ED Provider Notes (Signed)
History     CSN: [id number]  Arrival date & time 01/07/12  1129   None    Chief Complaint  Patient presents with  . Medication Reaction    HPI The patient presents today and reports that she's having some nausea with tramadol.  She reports that she's not able to take it regularly because she's having significant nausea but no vomiting.  She also reports that she's having chronic back pain that usually improves when she has physical therapy but reports that she cannot have physical therapy until she has a referral to the physical therapist.  She reports that she would like to have an orthopedic referral to have her finger evaluated because he continues to cause significant pain and injury and hopes is that it may improve with an injection.    Past Medical History  Diagnosis Date  . Obstructive sleep apnea (adult) (pediatric)   . Unspecified vitamin D deficiency   . Edema   . Other malaise and fatigue   . Allergic rhinitis, cause unspecified   . Unspecified asthma   . Arthropathy, unspecified, other specified sites   . Scoliosis (and kyphoscoliosis), idiopathic   . 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 Esophageal reflux   . Asymptomatic varicose veins   . Lumbago   . Pain in joint, shoulder region   . Cervicalgia   . Bronchitis   . Reactive airway disease     History reviewed. No pertinent past surgical history.  No family history on file.  History  Substance Use Topics  . Smoking status: Never Smoker   . Smokeless tobacco: Never Used  . Alcohol Use: No    OB History    Grav Para Term Preterm Abortions TAB SAB Ect Mult Living                 Review of Systems  Constitutional:       Poor appetite  HENT: Negative.   Eyes: Negative.   Cardiovascular: Negative.   Gastrointestinal: Negative.   Musculoskeletal: Positive for back pain, joint swelling and arthralgias.  Neurological: Positive for weakness and numbness.   Hematological: Negative for adenopathy. Does not bruise/bleed easily.  Psychiatric/Behavioral: Negative for hallucinations, behavioral problems, confusion, dysphoric mood, decreased concentration and agitation.    Allergies  Review of patient's allergies indicates no known allergies.  Home Medications   Current Outpatient Rx  Name  Route  Sig  Dispense  Refill  . ACETAMINOPHEN 325 MG PO TABS   Oral   Take 2 tablets (650 mg total) by mouth every 6 (six) hours as needed for pain (joint pain).   60 tablet   0   . ALBUTEROL SULFATE HFA 108 (90 BASE) MCG/ACT IN AERS   Inhalation   Inhale 2 puffs into the lungs every 4 (four) hours as needed. For cough or SOB         . DEXLANSOPRAZOLE 60 MG PO CPDR   Oral   Take 1 capsule (60 mg total) by mouth daily.   30 capsule   3   . DIAZEPAM 5 MG PO TABS   Oral   Take 1 tablet (5 mg total) by mouth 2 (two) times daily.   10 tablet   0   . DOXEPIN HCL 10 MG PO CAPS   Oral   Take 10 mg by mouth.         Marland Kitchen GABAPENTIN 300 MG PO CAPS   Oral   Take  1 capsule (300 mg total) by mouth 3 (three) times daily.         Marland Kitchen HYDROXYZINE HCL 10 MG PO TABS   Oral   Take 10 mg by mouth every 6 (six) hours as needed.           Marland Kitchen MECLIZINE HCL 25 MG PO TABS   Oral   Take 1 tablet (25 mg total) by mouth 3 (three) times daily as needed for dizziness.   30 tablet   0   . SERTRALINE HCL 50 MG PO TABS   Oral   Take 50 mg by mouth daily.           . TRAMADOL HCL 50 MG PO TABS   Oral   Take 1 tablet (50 mg total) by mouth every 6 (six) hours as needed for pain.   30 tablet   0   . VITAMIN B-12 1000 MCG PO TABS   Oral   Take 1,000 mcg by mouth daily.           BP 107/84  Pulse 76  Temp 97.9 F (36.6 C) (Oral)  Resp 19  SpO2 98%  Physical Exam  Constitutional: She is oriented to person, place, and time. She appears well-developed and well-nourished. No distress.  HENT:  Head: Normocephalic and atraumatic.  Eyes: EOM are  normal. Pupils are equal, round, and reactive to light.  Neck: Normal range of motion. Neck supple. No JVD present. No thyromegaly present.  Cardiovascular: Normal rate, regular rhythm and normal heart sounds.   Abdominal: Soft. Bowel sounds are normal. She exhibits no distension. There is no tenderness.  Musculoskeletal: Normal range of motion. She exhibits no edema.       Tenderness of the lumbar spine and paraspinal muscles  Neurological: She is alert and oriented to person, place, and time.  Skin: Skin is warm and dry. No erythema.  Psychiatric: She has a normal mood and affect. Her behavior is normal. Judgment and thought content normal.    ED Course  Procedures (including critical care time)  Labs Reviewed - No data to display No results found.   No diagnosis found.    MDM  IMPRESSION  Persistent finger pain  Chronic low back pain  Chronic shoulder pain  Chronic fatigue  RECOMMENDATIONS / PLAN I tried to complete the forms that they brought in regarding the multiple complaints Requesting referral to orthopedics to evaluate finger DC tramadol as this is causing nausea Referral to Physical Therapy [facility] [place] Continue gabapentin as ordered for paresthesias which seems to be helping  The patient was given clear instructions to go to ER or return to medical center if symptoms don't improve, worsen or new problems develop.  The patient verbalized understanding.  The patient was told to call to get lab results if they haven't heard anything in the next week.            Cleora Fleet, MD 01/07/12 2015

## 2012-01-08 ENCOUNTER — Telehealth (HOSPITAL_COMMUNITY): Payer: Self-pay | Admitting: Family Medicine

## 2012-01-08 NOTE — ED Notes (Signed)
I called [facility] to get an appointment for the patient.  They said that they would call the patient directly to schedule.    Rodney Langton, MD, CDE, FAAFP [facility] [place], [place]    Cleora Fleet, MD 01/08/12 1021

## 2012-01-08 NOTE — Telephone Encounter (Signed)
Message copied by Lestine Mount on Tue Jan 08, 2012  2:12 PM ------      Message from: Cleora Fleet      Created: Tue Jan 08, 2012  9:42 AM       Please notify patient that I called [facility] at [facility] and they said that patient cannot be referred to return until the first of the year and that they cannot accept a referral request from the provider until the first of the year.                    Rodney Langton, MD, CDE, FAAFP      [facility]      [facility]      [place], [place]

## 2012-01-08 NOTE — Telephone Encounter (Signed)
Message copied by Lestine Mount on Tue Jan 08, 2012  2:04 PM ------      Message from: Cleora Fleet      Created: Tue Jan 08, 2012  9:42 AM       Please notify patient that I called [facility] at [facility] and they said that patient cannot be referred to return until the first of the year and that they cannot accept a referral request from the provider until the first of the year.                    Rodney Langton, MD, CDE, FAAFP      [facility]      [facility]      [place], [place]

## 2012-04-10 ENCOUNTER — Encounter (HOSPITAL_COMMUNITY): Payer: Self-pay

## 2012-04-10 ENCOUNTER — Emergency Department (HOSPITAL_COMMUNITY)
Admission: EM | Admit: 2012-04-10 | Discharge: 2012-04-10 | Disposition: A | Discharge status: Home / Self Care | Payer: Medicaid Other | Source: Home / Self Care

## 2012-04-10 DIAGNOSIS — R109 Unspecified abdominal pain: Secondary | ICD-10-CM

## 2012-04-10 DIAGNOSIS — R5381 Other malaise: Secondary | ICD-10-CM

## 2012-04-10 DIAGNOSIS — M129 Arthropathy, unspecified: Secondary | ICD-10-CM

## 2012-04-10 DIAGNOSIS — J309 Allergic rhinitis, unspecified: Secondary | ICD-10-CM

## 2012-04-10 MED ORDER — TRAMADOL HCL 50 MG PO TABS: 50.0000 mg | ORAL_TABLET | Freq: Four times a day (QID) | ORAL | Status: DC | PRN

## 2012-04-10 MED ORDER — HYDROCORTISONE 1 % EX OINT: TOPICAL_OINTMENT | Freq: Two times a day (BID) | CUTANEOUS | Status: DC

## 2012-04-10 MED ORDER — DIAZEPAM 5 MG PO TABS: 5.0000 mg | ORAL_TABLET | Freq: Two times a day (BID) | ORAL | Status: DC

## 2012-04-10 MED ORDER — GABAPENTIN 300 MG PO CAPS: 300.0000 mg | ORAL_CAPSULE | Freq: Three times a day (TID) | ORAL | Status: DC

## 2012-04-10 NOTE — ED Notes (Signed)
Patient has trouble sleeping at night

## 2012-04-10 NOTE — ED Provider Notes (Signed)
History     CSN: [id number]  Arrival date & time 04/10/12  1554   None     Chief Complaint  Patient presents with  . Insomnia    (Consider location/radiation/quality/duration/timing/severity/associated sxs/prior treatment) HPI Patient is 57 year old female who presents with main concern of ongoing fatigue, she was seen here several weeks ago and was started on multivitamin but reports she's not feeling much more energetic. She denies chest pain or shortness of breath, no abdominal or urinary concerns, no specific focal neurological weakness. Patient denies recent sicknesses or hospitalizations. Patient denies heat or cold intolerance, no joint swelling, no recent injuries.  Past Medical History  Diagnosis Date  . Obstructive sleep apnea (adult) (pediatric)   . Unspecified vitamin D deficiency   . Edema   . Other malaise and fatigue   . Allergic rhinitis, cause unspecified   . Unspecified asthma   . Arthropathy, unspecified, other specified sites   . Scoliosis (and kyphoscoliosis), idiopathic   . 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 Esophageal reflux   . Asymptomatic varicose veins   . Lumbago   . Pain in joint, shoulder region   . Cervicalgia   . Bronchitis   . Reactive airway disease     History reviewed. No pertinent past surgical history.  No family history on file.  History  Substance Use Topics  . Smoking status: Never Smoker   . Smokeless tobacco: Never Used  . Alcohol Use: No    OB History   Grav Para Term Preterm Abortions TAB SAB Ect Mult Living                  Review of Systems Review of Systems  Constitutional: Negative for fever, chills, diaphoresis HENT: Negative for ear pain, nosebleeds, congestion, facial swelling, rhinorrhea, neck pain, neck stiffness and ear discharge.   Eyes: Negative for pain, discharge, redness, itching and visual disturbance.  Respiratory: Negative for cough, choking, chest  tightness, shortness of breath, wheezing and stridor.   Cardiovascular: Negative for chest pain, palpitations and leg swelling.  Gastrointestinal: Negative for abdominal distention.  Genitourinary: Negative for dysuria, urgency, frequency, hematuria, flank pain, decreased urine volume, difficulty urinating and dyspareunia.  Musculoskeletal: Negative for back pain, joint swelling, arthralgias and gait problem.  Neurological: Negative for dizziness, tremors, seizures, syncope, facial asymmetry, speech difficulty, weakness, light-headedness, numbness and headaches.  Hematological: Negative for adenopathy. Does not bruise/bleed easily.  Psychiatric/Behavioral: Negative for hallucinations, behavioral problems, confusion, dysphoric mood, decreased concentration and agitation.  '  Allergies  Review of patient's allergies indicates no known allergies.  Home Medications   Current Outpatient Rx  Name  Route  Sig  Dispense  Refill  . acetaminophen (TYLENOL) 325 MG tablet   Oral   Take 2 tablets (650 mg total) by mouth every 6 (six) hours as needed for pain (joint pain).   60 tablet   0   . albuterol (PROVENTIL HFA) 108 (90 BASE) MCG/ACT inhaler   Inhalation   Inhale 2 puffs into the lungs every 4 (four) hours as needed. For cough or SOB         . EXPIRED: dexlansoprazole (DEXILANT) 60 MG capsule   Oral   Take 1 capsule (60 mg total) by mouth daily.   30 capsule   3   . diazepam (VALIUM) 5 MG tablet   Oral   Take 1 tablet (5 mg total) by mouth 2 (two) times daily.   10 tablet  0   . doxepin (SINEQUAN) 10 MG capsule   Oral   Take 10 mg by mouth.         . gabapentin (NEURONTIN) 300 MG capsule   Oral   Take 1 capsule (300 mg total) by mouth 3 (three) times daily.   90 capsule   0   . hydrocortisone 1 % ointment   Topical   Apply topically 2 (two) times daily.   30 g   0   . hydrOXYzine (ATARAX) 10 MG tablet   Oral   Take 10 mg by mouth every 6 (six) hours as needed.            . meclizine (ANTIVERT) 25 MG tablet   Oral   Take 1 tablet (25 mg total) by mouth 3 (three) times daily as needed for dizziness.   30 tablet   0   . sertraline (ZOLOFT) 50 MG tablet   Oral   Take 50 mg by mouth daily.           . traMADol (ULTRAM) 50 MG tablet   Oral   Take 1 tablet (50 mg total) by mouth every 6 (six) hours as needed for pain.   45 tablet   3   . vitamin B-12 (CYANOCOBALAMIN) 1000 MCG tablet   Oral   Take 1,000 mcg by mouth daily.           BP 128/83  Pulse 79  Temp(Src) 98.4 F (36.9 C) (Oral)  Resp 17  SpO2 98%  Physical Exam  Constitutional: Appears well-developed and well-nourished. No distress.  HENT: Normocephalic. External right and left ear normal. Oropharynx is clear and moist.  Eyes: Conjunctivae and EOM are normal. PERRLA, no scleral icterus.  Neck: Normal ROM. Neck supple. No JVD. No tracheal deviation. No thyromegaly.  CVS: RRR, S1/S2 +, no murmurs, no gallops, no carotid bruit.  Pulmonary: Effort and breath sounds normal, no stridor, rhonchi, wheezes, rales.  Abdominal: Soft. BS +,  no distension, tenderness, rebound or guarding.  Musculoskeletal: Normal range of motion. No edema and no tenderness.  Lymphadenopathy: No lymphadenopathy noted, cervical, inguinal. Neuro: Alert. Normal reflexes, muscle tone coordination. No cranial nerve deficit. Skin: Skin is warm and dry. No rash noted. Not diaphoretic. No erythema. No pallor.  Psychiatric: Normal mood and affect. Behavior, judgment, thought content normal.    ED Course  Procedures (including critical care time)  1. ARTHRITIS, KNEES, BILATERAL   2. FATIGUE   - we have discussed importance of regular exercise, plenty of fluids to maintain hydration level, proper dietary regimen with vegetables and fruits - I have also emphasized importance of monitoring use of gabapentin as this could make the patient feel more tired - Patient has verbalized understanding -  Restrictions and medications provided    MDM  Fatigue        Dorothea Ogle, MD 04/10/12 1702

## 2012-06-02 ENCOUNTER — Encounter (HOSPITAL_COMMUNITY): Payer: Self-pay

## 2012-06-02 ENCOUNTER — Emergency Department (INDEPENDENT_AMBULATORY_CARE_PROVIDER_SITE_OTHER)
Admission: EM | Admit: 2012-06-02 | Discharge: 2012-06-02 | Disposition: A | Discharge status: Home / Self Care | Payer: Medicaid Other | Source: Home / Self Care

## 2012-06-02 DIAGNOSIS — H9209 Otalgia, unspecified ear: Secondary | ICD-10-CM

## 2012-06-02 DIAGNOSIS — H9201 Otalgia, right ear: Secondary | ICD-10-CM

## 2012-06-02 MED ORDER — HYDROCORTISONE 0.5 % EX CREA: TOPICAL_CREAM | Freq: Two times a day (BID) | CUTANEOUS | Status: DC

## 2012-06-02 MED ORDER — IBUPROFEN 400 MG PO TABS: 400.0000 mg | ORAL_TABLET | Freq: Four times a day (QID) | ORAL | Status: DC | PRN

## 2012-06-02 MED ORDER — NEOMYCIN-COLIST-HC-THONZONIUM 3.3-3-10-0.5 MG/ML OT SUSP: 3.0000 [drp] | Freq: Four times a day (QID) | OTIC | Status: DC

## 2012-06-02 NOTE — ED Notes (Signed)
Complain of pain to right ear Cough Rash under both breasts

## 2012-06-02 NOTE — ED Provider Notes (Addendum)
History     CSN: [id number]  Arrival date & time 06/02/12  1008   First MD Initiated Contact with Patient 06/02/12 1141      Chief Complaint  Patient presents with  . Otalgia     HPI 57 year old [place]  female accompanied by a friend who interpreted for her here for pain over her right ear since possible days. Patient had itching over her right year and try to clean some wax with her little finger an en face technique she may have broken her nail bed got stuck into the ear cavity. Since she has been having worsening pain in her right ear. Denies any bleeding or discharge. Denies any fever, chills, rhinitis, sore throat. Denies any nausea or vomiting. Past Medical History  Diagnosis Date  . Obstructive sleep apnea (adult) (pediatric)   . Unspecified vitamin D deficiency   . Edema   . Other malaise and fatigue   . Allergic rhinitis, cause unspecified   . Unspecified asthma   . Arthropathy, unspecified, other specified sites   . Scoliosis (and kyphoscoliosis), idiopathic   . 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 Esophageal reflux   . Asymptomatic varicose veins   . Lumbago   . Pain in joint, shoulder region   . Cervicalgia   . Bronchitis   . Reactive airway disease     History reviewed. No pertinent past surgical history.  No family history on file.  History  Substance Use Topics  . Smoking status: Never Smoker   . Smokeless tobacco: Never Used  . Alcohol Use: No    OB History   Grav Para Term Preterm Abortions TAB SAB Ect Mult Living                  Review of Systems As outlined in history of present illness Allergies  Review of patient's allergies indicates no known allergies.  Home Medications   Current Outpatient Rx  Name  Route  Sig  Dispense  Refill  . acetaminophen (TYLENOL) 325 MG tablet   Oral   Take 2 tablets (650 mg total) by mouth every 6 (six) hours as needed for pain (joint pain).   60 tablet   0    . albuterol (PROVENTIL HFA) 108 (90 BASE) MCG/ACT inhaler   Inhalation   Inhale 2 puffs into the lungs every 4 (four) hours as needed. For cough or SOB         . EXPIRED: dexlansoprazole (DEXILANT) 60 MG capsule   Oral   Take 1 capsule (60 mg total) by mouth daily.   30 capsule   3   . diazepam (VALIUM) 5 MG tablet   Oral   Take 1 tablet (5 mg total) by mouth 2 (two) times daily.   10 tablet   0   . doxepin (SINEQUAN) 10 MG capsule   Oral   Take 10 mg by mouth.         . gabapentin (NEURONTIN) 300 MG capsule   Oral   Take 1 capsule (300 mg total) by mouth 3 (three) times daily.   90 capsule   0   . hydrocortisone 1 % ointment   Topical   Apply topically 2 (two) times daily.   30 g   0   . hydrocortisone cream 0.5 %   Topical   Apply topically 2 (two) times daily. Underneath b/l breast for dermatitis   30 g   0   .  hydrOXYzine (ATARAX) 10 MG tablet   Oral   Take 10 mg by mouth every 6 (six) hours as needed.           Marland Kitchen ibuprofen (ADVIL) 400 MG tablet   Oral   Take 1 tablet (400 mg total) by mouth every 6 (six) hours as needed for pain.   30 tablet   0   . meclizine (ANTIVERT) 25 MG tablet   Oral   Take 1 tablet (25 mg total) by mouth 3 (three) times daily as needed for dizziness.   30 tablet   0   . neomycin-colistin-hydrocortisone-thonzonium (CORTISPORIN-TC) 3.03-31-08-0.5 MG/ML otic suspension   Right Ear   Place 3 drops into the right ear 4 (four) times daily.   10 mL   0   . sertraline (ZOLOFT) 50 MG tablet   Oral   Take 50 mg by mouth daily.           . traMADol (ULTRAM) 50 MG tablet   Oral   Take 1 tablet (50 mg total) by mouth every 6 (six) hours as needed for pain.   45 tablet   3   . vitamin B-12 (CYANOCOBALAMIN) 1000 MCG tablet   Oral   Take 1,000 mcg by mouth daily.           BP 121/87  Pulse 89  Temp(Src) 98.6 F (37 C) (Oral)  Resp 16  SpO2 99%  Physical Exam Middle aged female in no acute distress HEENT: Right  ear exam shows some wax in the auditory canal with area of laceration and  bleeding from scratching. Some wax removed wit a q tip. No foreign body found. Left ear exam shows some wax deposition as well. Chest: Clear to auscultation bilaterally, superficial dermatitis underneath the left breast. CVS: Normal S1 and S2 Abdomen: Soft, nontender Extremities: Warm, no edema  ED Course  Procedures (including critical care time)  Labs Reviewed - No data to display No results found.   1. Otalgia of right ear    appears to be traumatic with  Scratching. Some wax cleaned with a qt ip. Ordered for cortisporin ear drop. Will also prescribe advil prn for pain. instructed not to insert finger to scratch. Recommend gentle q tip use to clean after shower.   Dermatitis underneath left breast Ordered for hydrocortisone ointment.  Continue the remaining home medications    MDM  Followup as needed        Eddie North, MD 06/02/12 1610  Theda Belfast Laurin Morgenstern, MD 06/02/12 9604

## 2012-06-16 ENCOUNTER — Emergency Department (HOSPITAL_COMMUNITY)
Admission: EM | Admit: 2012-06-16 | Discharge: 2012-06-16 | Disposition: A | Discharge status: Home / Self Care | Payer: Medicaid Other | Source: Home / Self Care | Attending: Family Medicine | Admitting: Family Medicine

## 2012-06-16 ENCOUNTER — Encounter (HOSPITAL_COMMUNITY): Payer: Self-pay | Admitting: Emergency Medicine

## 2012-06-16 DIAGNOSIS — J309 Allergic rhinitis, unspecified: Secondary | ICD-10-CM

## 2012-06-16 DIAGNOSIS — H9209 Otalgia, unspecified ear: Secondary | ICD-10-CM

## 2012-06-16 DIAGNOSIS — H9201 Otalgia, right ear: Secondary | ICD-10-CM

## 2012-06-16 DIAGNOSIS — B372 Candidiasis of skin and nail: Secondary | ICD-10-CM

## 2012-06-16 DIAGNOSIS — J302 Other seasonal allergic rhinitis: Secondary | ICD-10-CM

## 2012-06-16 MED ORDER — ANTIPYRINE-BENZOCAINE 5.4-1.4 % OT SOLN: 3.0000 [drp] | OTIC | Status: DC | PRN

## 2012-06-16 MED ORDER — NYSTATIN-TRIAMCINOLONE 100000-0.1 UNIT/GM-% EX OINT: TOPICAL_OINTMENT | Freq: Two times a day (BID) | CUTANEOUS | Status: DC

## 2012-06-16 MED ORDER — CETIRIZINE HCL 10 MG PO TABS: 10.0000 mg | ORAL_TABLET | Freq: Every day | ORAL | Status: AC | PRN

## 2012-06-16 NOTE — ED Notes (Signed)
Pt c/o right ear pain onset 2 weeks Sx include cough Also c/o back pain onset 2 yrs Denies any new inj/trauma Goes to the [facility]  She is alert and oriented w/no signs of acute distress.

## 2012-06-16 NOTE — ED Provider Notes (Signed)
History     CSN: [id number]  Arrival date & time 06/16/12  1052   First MD Initiated Contact with Patient 06/16/12 1216      Chief Complaint  Patient presents with  . Otalgia    (Consider location/radiation/quality/duration/timing/severity/associated sxs/prior treatment) HPI Comments: 57 year old female here complaining of persistent right ear pain associated with decrease ear hearing in the right side. She was seen for the same complaint 3 weeks ago was given a prescription for Cortisporin states that symptoms are similar and unchanged with this therapy. Denies ear drainage. Patient has had dizziness and nausea associated with ear pain in the past. Difficult to assess the patient has a history of Mnire's disease. Denies tinnitus. Describes pain as sharp razor type of pain. Denies history of herpes zoster. Also patient complaining of itchy rash below her breast below her axilla and her bilateral groin areashad a prescription for hydrocortisone which transiently helped with itchiness. Rash persist as well as itchiness. Patient also complaining of nasal congestion, sneezing and nonproductive cough intermittently for the last month. Denies shortness of breath, chest pain or wheezing.    Past Medical History  Diagnosis Date  . Obstructive sleep apnea (adult) (pediatric)   . Unspecified vitamin D deficiency   . Edema   . Other malaise and fatigue   . Allergic rhinitis, cause unspecified   . Unspecified asthma   . Arthropathy, unspecified, other specified sites   . Scoliosis (and kyphoscoliosis), idiopathic   . 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 Esophageal reflux   . Asymptomatic varicose veins   . Lumbago   . Pain in joint, shoulder region   . Cervicalgia   . Bronchitis   . Reactive airway disease     History reviewed. No pertinent past surgical history.  No family history on file.  History  Substance Use Topics  . Smoking status:  Never Smoker   . Smokeless tobacco: Never Used  . Alcohol Use: No    OB History   Grav Para Term Preterm Abortions TAB SAB Ect Mult Living                  Review of Systems  Constitutional: Negative for fever, chills, diaphoresis, activity change, appetite change and fatigue.  HENT: Positive for hearing loss, ear pain, congestion, rhinorrhea and sneezing. Negative for sore throat, facial swelling, trouble swallowing, neck pain, sinus pressure and ear discharge.   Respiratory: Positive for cough. Negative for chest tightness, shortness of breath and wheezing.   Cardiovascular: Negative for chest pain.       As per HPI  Gastrointestinal: Negative for nausea, vomiting, abdominal pain and diarrhea.  Endocrine: Negative for cold intolerance, heat intolerance, polydipsia, polyphagia and polyuria.  Skin: Positive for rash.       As per HPI  Neurological: Negative for dizziness, seizures and headaches.  All other systems reviewed and are negative.    Allergies  Review of patient's allergies indicates no known allergies.  Home Medications   Current Outpatient Rx  Name  Route  Sig  Dispense  Refill  . acetaminophen (TYLENOL) 325 MG tablet   Oral   Take 2 tablets (650 mg total) by mouth every 6 (six) hours as needed for pain (joint pain).   60 tablet   0   . albuterol (PROVENTIL HFA) 108 (90 BASE) MCG/ACT inhaler   Inhalation   Inhale 2 puffs into the lungs every 4 (four) hours as needed. For cough or  SOB         . antipyrine-benzocaine (AURALGAN) otic solution   Right Ear   Place 3 drops into the right ear every 2 (two) hours as needed for pain.   10 mL   0   . cetirizine (ZYRTEC) 10 MG tablet   Oral   Take 1 tablet (10 mg total) by mouth daily as needed for allergies.   30 tablet   0   . EXPIRED: dexlansoprazole (DEXILANT) 60 MG capsule   Oral   Take 1 capsule (60 mg total) by mouth daily.   30 capsule   3   . diazepam (VALIUM) 5 MG tablet   Oral   Take 1  tablet (5 mg total) by mouth 2 (two) times daily.   10 tablet   0   . doxepin (SINEQUAN) 10 MG capsule   Oral   Take 10 mg by mouth.         . gabapentin (NEURONTIN) 300 MG capsule   Oral   Take 1 capsule (300 mg total) by mouth 3 (three) times daily.   90 capsule   0   . ibuprofen (ADVIL,MOTRIN) 400 MG tablet   Oral   Take 1 tablet (400 mg total) by mouth every 6 (six) hours as needed for pain.   30 tablet   0   . nystatin-triamcinolone ointment (MYCOLOG)   Topical   Apply topically 2 (two) times daily.   30 g   0   . sertraline (ZOLOFT) 50 MG tablet   Oral   Take 50 mg by mouth daily.           . traMADol (ULTRAM) 50 MG tablet   Oral   Take 1 tablet (50 mg total) by mouth every 6 (six) hours as needed for pain.   45 tablet   3   . vitamin B-12 (CYANOCOBALAMIN) 1000 MCG tablet   Oral   Take 1,000 mcg by mouth daily.           BP 137/89  Pulse 71  Temp(Src) 98 F (36.7 C) (Oral)  Resp 16  SpO2 99%  Physical Exam  Nursing note and vitals reviewed. Constitutional: She is oriented to person, place, and time. She appears well-developed and well-nourished. No distress.  HENT:  Head: Normocephalic and atraumatic.  Right Ear: External ear normal.  Left Ear: External ear normal.  Mouth/Throat: Oropharynx is clear and moist. No oropharyngeal exudate.  Nasal Congestion with erythema and swelling of nasal turbinates, clear rhinorrhea. No pharyngeal erythema, no exudates. No uvula deviation. No trismus. TM's normal.  Eyes: Conjunctivae are normal. Right eye exhibits no discharge. Left eye exhibits no discharge. No scleral icterus.  Neck: Neck supple. No thyromegaly present.  Cardiovascular: Normal rate, regular rhythm and normal heart sounds.   Pulmonary/Chest: Effort normal and breath sounds normal. No respiratory distress. She has no wheezes. She has no rales. She exhibits no tenderness.  Lymphadenopathy:    She has no cervical adenopathy.  Neurological:  She is alert and oriented to person, place, and time.  Skin: Rash noted. She is not diaphoretic.  Hyperpigmented itchi skin below breasts and groin areas bilaterally.     ED Course  Procedures (including critical care time)  Labs Reviewed - No data to display No results found.   1. Right ear pain   2. Candidiasis, intertrigo   3. Seasonal allergies       MDM  Normal your exam. Prescribed Auralgan. Referred to ENT. Intertrigo treated  with nystatin/hydrocortisone. Prescribed cetirizine for seasonal allergies. Supportive care and red flags that should prompt her return to medical attention discussed with patient through her family interpreter (daughter) and provided in writing.        Sharin Grave, MD 06/18/12 1029

## 2012-06-24 ENCOUNTER — Ambulatory Visit: Payer: Medicaid Other | Attending: Family Medicine | Admitting: Family Medicine

## 2012-06-24 ENCOUNTER — Ambulatory Visit: Payer: Medicaid Other

## 2012-06-24 VITALS — BP 123/84 | HR 76 | Temp 98.5°F | Resp 17 | Wt 169.8 lb

## 2012-06-24 DIAGNOSIS — M2550 Pain in unspecified joint: Secondary | ICD-10-CM

## 2012-06-24 DIAGNOSIS — G589 Mononeuropathy, unspecified: Secondary | ICD-10-CM

## 2012-06-24 DIAGNOSIS — G629 Polyneuropathy, unspecified: Secondary | ICD-10-CM

## 2012-06-24 MED ORDER — MELOXICAM 7.5 MG PO TABS: ORAL_TABLET | ORAL | Status: AC

## 2012-06-24 NOTE — Progress Notes (Signed)
Patient complains of back pain Foot pain and feels like feet are burning

## 2012-06-24 NOTE — Patient Instructions (Signed)
Arthralgia Your caregiver has diagnosed you as suffering from an arthralgia. Arthralgia means there is pain in a joint. This can come from many reasons including:  Bruising the joint which causes soreness (inflammation) in the joint.  Wear and tear on the joints which occur as we grow older (osteoarthritis).  Overusing the joint.  Various forms of arthritis.  Infections of the joint. Regardless of the cause of pain in your joint, most of these different pains respond to anti-inflammatory drugs and rest. The exception to this is when a joint is infected, and these cases are treated with antibiotics, if it is a bacterial infection. HOME CARE INSTRUCTIONS   Rest the injured area for as long as directed by your caregiver. Then slowly start using the joint as directed by your caregiver and as the pain allows. Crutches as directed may be useful if the ankles, knees or hips are involved. If the knee was splinted or casted, continue use and care as directed. If an stretchy or elastic wrapping bandage has been applied today, it should be removed and re-applied every 3 to 4 hours. It should not be applied tightly, but firmly enough to keep swelling down. Watch toes and feet for swelling, bluish discoloration, coldness, numbness or excessive pain. If any of these problems (symptoms) occur, remove the ace bandage and re-apply more loosely. If these symptoms persist, contact your caregiver or return to this location.  For the first 24 hours, keep the injured extremity elevated on pillows while lying down.  Apply ice for 15-20 minutes to the sore joint every couple hours while awake for the first half day. Then 3-4 times per day for the first 48 hours. Put the ice in a plastic bag and place a towel between the bag of ice and your skin.  Wear any splinting, casting, elastic bandage applications, or slings as instructed.  Only take over-the-counter or prescription medicines for pain, discomfort, or fever as  directed by your caregiver. Do not use aspirin immediately after the injury unless instructed by your physician. Aspirin can cause increased bleeding and bruising of the tissues.  If you were given crutches, continue to use them as instructed and do not resume weight bearing on the sore joint until instructed. Persistent pain and inability to use the sore joint as directed for more than 2 to 3 days are warning signs indicating that you should see a caregiver for a follow-up visit as soon as possible. Initially, a hairline fracture (break in bone) may not be evident on X-rays. Persistent pain and swelling indicate that further evaluation, non-weight bearing or use of the joint (use of crutches or slings as instructed), or further X-rays are indicated. X-rays may sometimes not show a small fracture until a week or 10 days later. Make a follow-up appointment with your own caregiver or one to whom we have referred you. A radiologist (specialist in reading X-rays) may read your X-rays. Make sure you know how you are to obtain your X-ray results. Do not assume everything is normal if you do not hear from us. SEEK MEDICAL CARE IF: Bruising, swelling, or pain increases. SEEK IMMEDIATE MEDICAL CARE IF:   Your fingers or toes are numb or blue.  The pain is not responding to medications and continues to stay the same or get worse.  The pain in your joint becomes severe.  You develop a fever over 102 F (38.9 C).  It becomes impossible to move or use the joint. MAKE SURE YOU:     Understand these instructions.  Will watch your condition.  Will get help right away if you are not doing well or get worse. Document Released: 01/15/2005 Document Revised: 04/09/2011 Document Reviewed: 09/03/2007 [employer] Patient Information 2014 [employer].  

## 2012-06-25 LAB — CBC WITH DIFFERENTIAL/PLATELET
Basophils Absolute: 0 10*3/uL (ref 0.0–0.1)
Eosinophils Relative: 2 % (ref 0–5)
HCT: 37.2 % (ref 36.0–46.0)
Lymphocytes Relative: 49 % — ABNORMAL HIGH (ref 12–46)
Lymphs Abs: 2.4 10*3/uL (ref 0.7–4.0)
MCV: 85.3 fL (ref 78.0–100.0)
Monocytes Absolute: 0.4 10*3/uL (ref 0.1–1.0)
Neutro Abs: 2.1 10*3/uL (ref 1.7–7.7)
RBC: 4.36 MIL/uL (ref 3.87–5.11)
RDW: 13.9 % (ref 11.5–15.5)
WBC: 5 10*3/uL (ref 4.0–10.5)

## 2012-06-25 LAB — C-REACTIVE PROTEIN: CRP: 0.9 mg/dL — ABNORMAL HIGH (ref <0.60)

## 2012-06-25 LAB — SEDIMENTATION RATE: Sed Rate: 9 mm/hr (ref 0–22)

## 2012-07-01 NOTE — Progress Notes (Signed)
Subjective:     Patient ID: Kayla Davies, female   DOB: [birth year], 57 y.o.   MRN: [id number]  HPI Pt here c/o generalized joint pain and burning on the bottoms of her feet. Translation provided by family per pts request.   Joint pain in knees, ankles, and back. Sometimes shoulders. She has not tried anything for it. Unsure what makes it better or worse. This has been worsening over several mos. No trauma or other inciting event she recalls.   Burning of feet - for past 2 mos, not worse with walking or resting, nothing relieves. She does dye the bottoms of her feet with a skin dye (sounds similar to henna).    Review of Systems per hpi      Objective:   Physical Exam  Nursing note and vitals reviewed. Constitutional: She appears well-developed and well-nourished.  Cardiovascular: Normal rate, regular rhythm and normal heart sounds.   Pulmonary/Chest: Effort normal and breath sounds normal.  Musculoskeletal:  Mild swelling of ankles and knees. Equal bilaterally. neurovasc intact.   Skin: Skin is warm.  Psychiatric: She has a normal mood and affect.       Assessment:     Joint pain - Plan: CBC with Differential, Vitamin B12, Sedimentation Rate, C-reactive protein, HgB A1c, meloxicam (MOBIC) 7.5 MG tablet  Neuropathy - Plan: Vitamin B12, HgB A1c       Plan:     Joint pain - given age, most likely OA but could consider RA w/u in future if indicated. Treatment as baove for now (conservative), f/u in a few weeks to re-evaluate.   Neuropathy - suspect may be related to the dye, but will also r/u b12 deficiency and check a1c. suggested she hold off on the dye for now.   rtc 2-3 weeks, earlier if needed. Call with questions or concerns.

## 2012-07-08 ENCOUNTER — Ambulatory Visit: Payer: Medicaid Other | Attending: Family Medicine | Admitting: Internal Medicine

## 2012-07-08 VITALS — BP 120/80 | HR 89 | Temp 98.6°F | Resp 16 | Ht 66.0 in | Wt 167.0 lb

## 2012-07-08 DIAGNOSIS — B369 Superficial mycosis, unspecified: Secondary | ICD-10-CM | POA: Insufficient documentation

## 2012-07-08 DIAGNOSIS — E785 Hyperlipidemia, unspecified: Secondary | ICD-10-CM

## 2012-07-08 DIAGNOSIS — G5790 Unspecified mononeuropathy of unspecified lower limb: Secondary | ICD-10-CM | POA: Insufficient documentation

## 2012-07-08 DIAGNOSIS — G629 Polyneuropathy, unspecified: Secondary | ICD-10-CM | POA: Insufficient documentation

## 2012-07-08 DIAGNOSIS — M549 Dorsalgia, unspecified: Secondary | ICD-10-CM | POA: Insufficient documentation

## 2012-07-08 DIAGNOSIS — G589 Mononeuropathy, unspecified: Secondary | ICD-10-CM

## 2012-07-08 LAB — LIPID PANEL
Cholesterol: 252 mg/dL — ABNORMAL HIGH (ref 0–200)
HDL: 44 mg/dL
LDL Cholesterol: 168 mg/dL — ABNORMAL HIGH (ref 0–99)
Total CHOL/HDL Ratio: 5.7 ratio
Triglycerides: 200 mg/dL — ABNORMAL HIGH
VLDL: 40 mg/dL (ref 0–40)

## 2012-07-08 LAB — POCT GLYCOSYLATED HEMOGLOBIN (HGB A1C): Hemoglobin A1C: 5.6

## 2012-07-08 LAB — TSH: TSH: 2.731 u[IU]/mL (ref 0.350–4.500)

## 2012-07-08 MED ORDER — GABAPENTIN 300 MG PO CAPS: 300.0000 mg | ORAL_CAPSULE | Freq: Three times a day (TID) | ORAL | Status: DC

## 2012-07-08 MED ORDER — NYSTATIN 100000 UNIT/GM EX POWD: Freq: Four times a day (QID) | CUTANEOUS | Status: DC

## 2012-07-08 MED ORDER — OXYCODONE-ACETAMINOPHEN 5-325 MG PO TABS: 1.0000 | ORAL_TABLET | Freq: Three times a day (TID) | ORAL | Status: DC | PRN

## 2012-07-08 MED ORDER — CYCLOBENZAPRINE HCL 10 MG PO TABS: 10.0000 mg | ORAL_TABLET | Freq: Two times a day (BID) | ORAL | Status: DC | PRN

## 2012-07-08 NOTE — Progress Notes (Signed)
Patient ID: Kayla Davies, female   DOB: 04/05/1955, 57 y.o.   MRN: [id number]  CC: Followup visit  HPI: 57 year old female with past medical history including but not limited to generalized pain, obstructive sleep apnea, allergic rhinitis who presented to clinic for followup. Patient is taking tramadol for pain relief but she reports no symptomatic relief. She also complains of neuropathy in her fingers as well as toes/feet. Per patient this has been a chronic problem for last few years. Patient also complains of low back pain not relieved with current analgesics. Patient also endorses fungal rash under the right side of the breast. No complaints of chest pain, no shortness of breath and on fever or chills. No abdominal pain and no nausea or vomiting.  No Known Allergies Past Medical History  Diagnosis Date  . Obstructive sleep apnea (adult) (pediatric)   . Unspecified vitamin D deficiency   . Edema   . Other malaise and fatigue   . Allergic rhinitis, cause unspecified   . Unspecified asthma(493.90)   . Arthropathy, unspecified, other specified sites   . Scoliosis (and kyphoscoliosis), idiopathic   . 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 Esophageal reflux   . Asymptomatic varicose veins   . Lumbago   . Pain in joint, shoulder region   . Cervicalgia   . Bronchitis   . Reactive airway disease    Current Outpatient Prescriptions on File Prior to Visit  Medication Sig Dispense Refill  . acetaminophen (TYLENOL) 325 MG tablet Take 2 tablets (650 mg total) by mouth every 6 (six) hours as needed for pain (joint pain).  60 tablet  0  . albuterol (PROVENTIL HFA) 108 (90 BASE) MCG/ACT inhaler Inhale 2 puffs into the lungs every 4 (four) hours as needed. For cough or SOB      . antipyrine-benzocaine (AURALGAN) otic solution Place 3 drops into the right ear every 2 (two) hours as needed for pain.  10 mL  0  . cetirizine (ZYRTEC) 10 MG tablet Take 1 tablet (10  mg total) by mouth daily as needed for allergies.  30 tablet  0  . diazepam (VALIUM) 5 MG tablet Take 1 tablet (5 mg total) by mouth 2 (two) times daily.  10 tablet  0  . doxepin (SINEQUAN) 10 MG capsule Take 10 mg by mouth.      Marland Kitchen ibuprofen (ADVIL,MOTRIN) 400 MG tablet Take 1 tablet (400 mg total) by mouth every 6 (six) hours as needed for pain.  30 tablet  0  . meloxicam (MOBIC) 7.5 MG tablet 1-2 tabs by mouth daily with food as needed for pain. Do not take ibuprofen with this medicine.  30 tablet  0  . nystatin-triamcinolone ointment (MYCOLOG) Apply topically 2 (two) times daily.  30 g  0  . sertraline (ZOLOFT) 50 MG tablet Take 50 mg by mouth daily.        . vitamin B-12 (CYANOCOBALAMIN) 1000 MCG tablet Take 1,000 mcg by mouth daily.      Marland Kitchen dexlansoprazole (DEXILANT) 60 MG capsule Take 1 capsule (60 mg total) by mouth daily.  30 capsule  3  . [DISCONTINUED] pregabalin (LYRICA) 75 MG capsule Take 1 capsule (75 mg total) by mouth 2 (two) times daily.  60 capsule  1   No current facility-administered medications on file prior to visit.   Family medical history significant for HTN, HLD  History   Social History  . Marital Status: Single    Spouse  Name: N/A    Number of Children: 3  . Years of Education: N/A   Occupational History  . Not on file.   Social History Main Topics  . Smoking status: Never Smoker   . Smokeless tobacco: Never Used  . Alcohol Use: No  . Drug Use: No  . Sexually Active: Not on file   Other Topics Concern  . Not on file   Social History Narrative  . No narrative on file    Review of Systems  Constitutional: Negative for fever, chills, diaphoresis, activity change, appetite change and fatigue.  HENT: Negative for ear pain, nosebleeds, congestion, facial swelling, rhinorrhea, neck pain, neck stiffness and ear discharge.   Eyes: Negative for pain, discharge, redness, itching and visual disturbance.  Respiratory: Negative for cough, choking, chest  tightness, shortness of breath, wheezing and stridor.   Cardiovascular: Negative for chest pain, palpitations and leg swelling.  Gastrointestinal: Negative for abdominal distention.  Genitourinary: Negative for dysuria, urgency, frequency, hematuria, flank pain, decreased urine volume, difficulty urinating and dyspareunia.  Musculoskeletal: Negative for back pain, joint swelling, arthralgias and gait problem.  Neurological: Positive for neuropathic type of pain in the fingertips as well feet  Hematological: Negative for adenopathy. Does not bruise/bleed easily.  Psychiatric/Behavioral: Negative for hallucinations, behavioral problems, confusion, dysphoric mood, decreased concentration and agitation.    Objective:   Filed Vitals:   07/08/12 1246  BP: 120/80  Pulse: 89  Temp: 98.6 F (37 C)  Resp: 16    Physical Exam  Constitutional: Appears well-developed and well-nourished. No distress.  HENT: Normocephalic. External right and left ear normal. Oropharynx is clear and moist.  Eyes: Conjunctivae and EOM are normal. PERRLA, no scleral icterus.  Neck: Normal ROM. Neck supple. No JVD. No tracheal deviation. No thyromegaly.  CVS: RRR, S1/S2 +, no murmurs, no gallops, no carotid bruit.  Pulmonary: Effort and breath sounds normal, no stridor, rhonchi, wheezes, rales.  Abdominal: Soft. BS +,  no distension, tenderness, rebound or guarding.  Musculoskeletal: Normal range of motion. No edema and no tenderness.  Lymphadenopathy: No lymphadenopathy noted, cervical, inguinal. Neuro: Alert. Normal reflexes, muscle tone coordination. No cranial nerve deficit. Skin: Fungal rash under the right breast  Psychiatric: Normal mood and affect. Behavior, judgment, thought content normal.   Lab Results  Component Value Date   WBC 5.0 06/24/2012   HGB 12.6 06/24/2012   HCT 37.2 06/24/2012   MCV 85.3 06/24/2012   PLT 226 06/24/2012   Lab Results  Component Value Date   CREATININE 0.64 12/18/2011   BUN  16 12/18/2011   NA 139 12/18/2011   K 3.9 12/18/2011   CL 103 12/18/2011   CO2 26 12/18/2011    Lab Results  Component Value Date   HGBA1C 85 06/23/2008   Lipid Panel     Component Value Date/Time   CHOL 204* 04/05/2010 1935   TRIG 182* 04/05/2010 1935   HDL 37* 04/05/2010 1935   CHOLHDL 5.5 Ratio 04/05/2010 1935   VLDL 36 04/05/2010 1935   LDLCALC 131* 04/05/2010 1935       Assessment and plan:   Patient Active Problem List   Diagnosis Date Noted  . Neuropathy 07/08/2012    Priority: High - Will start gabapentin 100 mg 3 times a day  - Check A1c level, TSH and lipid panel      Fungal rash  - Under the right breast  - Prescription provided for nystatin powder    . Back pain 07/08/2012  Priority: Medium - Tramadol provides no symptomatic relief. Stop tramadol and start Percocet every 4 hours as needed - Patient was instructed to come back in about one month or sooner if symptoms persist  - prescription provided flexeril 10 mg Q 12 hours PRN

## 2012-07-08 NOTE — Progress Notes (Signed)
Patient presents for follow up for back and hand pain; pt states she want referral for PT for her back at [facility].

## 2012-07-08 NOTE — Patient Instructions (Signed)
Neuropathy Neuropathy means your peripheral nerves are not working normally. Peripheral nerves are the nerves outside the brain and spinal cord. Messages between the brain and the rest of the body do not work properly with peripheral nerve disorders. CAUSES There are many different causes of peripheral nerve disorders. These include:  Injury.   Infections.   Diabetes.   Vitamin deficiency.   Poor circulation.   Alcoholism.   Exposure to toxins.   Drug effects.   Tumors.   Kidney disease.  SYMPTOMS  Tingling, burning, pain, and numbness in the extremities.   Weakness and loss of muscle tone and size.  DIAGNOSIS Blood tests and special studies of nerve function may help confirm the diagnosis.  TREATMENT  Treatment includes adopting healthy life habits.   A good diet, vitamin supplements, and mild pain medicine may be needed.   Avoid known toxins such as alcohol, tobacco, and recreational drugs.   Anti-convulsant medicines are helpful in some types of neuropathy.  Make a follow-up appointment with your caregiver to be sure you are getting better with treatment.  SEEK IMMEDIATE MEDICAL CARE IF:   You have breathing problems.   You have severe or uncontrolled pain.   You notice extreme weakness or you feel faint.   You are not better after 1 week or if you have worse symptoms.  Document Released: 02/23/2004 Document Revised: 09/27/2010 Document Reviewed: 01/15/2005 [employer] Patient Information 2012 [employer]. 

## 2012-07-09 ENCOUNTER — Ambulatory Visit: Payer: Medicaid Other

## 2012-08-07 ENCOUNTER — Ambulatory Visit: Payer: Medicaid Other

## 2012-08-08 ENCOUNTER — Ambulatory Visit: Payer: Medicaid Other

## 2012-10-01 ENCOUNTER — Encounter (HOSPITAL_COMMUNITY): Payer: Self-pay | Admitting: Emergency Medicine

## 2012-10-01 ENCOUNTER — Emergency Department (HOSPITAL_COMMUNITY)
Admission: EM | Admit: 2012-10-01 | Discharge: 2012-10-01 | Disposition: A | Discharge status: Home / Self Care | Payer: Medicaid Other | Source: Home / Self Care | Attending: Family Medicine | Admitting: Family Medicine

## 2012-10-01 DIAGNOSIS — S90569A Insect bite (nonvenomous), unspecified ankle, initial encounter: Secondary | ICD-10-CM

## 2012-10-01 DIAGNOSIS — B351 Tinea unguium: Secondary | ICD-10-CM

## 2012-10-01 DIAGNOSIS — S80862A Insect bite (nonvenomous), left lower leg, initial encounter: Secondary | ICD-10-CM

## 2012-10-01 MED ORDER — FLUTICASONE PROPIONATE 0.05 % EX CREA: TOPICAL_CREAM | Freq: Two times a day (BID) | CUTANEOUS | Status: DC

## 2012-10-01 MED ORDER — TERBINAFINE HCL 250 MG PO TABS: 250.0000 mg | ORAL_TABLET | Freq: Every day | ORAL | Status: DC

## 2012-10-01 NOTE — ED Provider Notes (Signed)
CSN: [id number]     Arrival date & time 10/01/12  1324 History   First MD Initiated Contact with Patient 10/01/12 1500     Chief Complaint  Patient presents with  . Nail Problem    both problems present x 2 wks.   . Leg Pain   (Consider location/radiation/quality/duration/timing/severity/associated sxs/prior Treatment) Patient is a 57 y.o. female presenting with rash. The history is provided by the patient and a relative.  Rash Pain severity:  Mild Progression:  Worsening Chronicity:  Chronic Context comment:  Toe and fingernail onycho and rash on left calf.   Past Medical History  Diagnosis Date  . Obstructive sleep apnea (adult) (pediatric)   . Unspecified vitamin D deficiency   . Edema   . Other malaise and fatigue   . Allergic rhinitis, cause unspecified   . Unspecified asthma(493.90)   . Arthropathy, unspecified, other specified sites   . Scoliosis (and kyphoscoliosis), idiopathic   . 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 Esophageal reflux   . Asymptomatic varicose veins   . Lumbago   . Pain in joint, shoulder region   . Cervicalgia   . Bronchitis   . Reactive airway disease    History reviewed. No pertinent past surgical history. History reviewed. No pertinent family history. History  Substance Use Topics  . Smoking status: Never Smoker   . Smokeless tobacco: Never Used  . Alcohol Use: No   OB History   Grav Para Term Preterm Abortions TAB SAB Ect Mult Living                 Review of Systems  Constitutional: Negative.   Skin: Positive for rash.    Allergies  Review of patient's allergies indicates no known allergies.  Home Medications   Current Outpatient Rx  Name  Route  Sig  Dispense  Refill  . acetaminophen (TYLENOL) 325 MG tablet   Oral   Take 2 tablets (650 mg total) by mouth every 6 (six) hours as needed for pain (joint pain).   60 tablet   0   . albuterol (PROVENTIL HFA) 108 (90 BASE) MCG/ACT  inhaler   Inhalation   Inhale 2 puffs into the lungs every 4 (four) hours as needed. For cough or SOB         . antipyrine-benzocaine (AURALGAN) otic solution   Right Ear   Place 3 drops into the right ear every 2 (two) hours as needed for pain.   10 mL   0   . cetirizine (ZYRTEC) 10 MG tablet   Oral   Take 1 tablet (10 mg total) by mouth daily as needed for allergies.   30 tablet   0   . cyclobenzaprine (FLEXERIL) 10 MG tablet   Oral   Take 1 tablet (10 mg total) by mouth 2 (two) times daily as needed for muscle spasms.   60 tablet   0   . EXPIRED: dexlansoprazole (DEXILANT) 60 MG capsule   Oral   Take 1 capsule (60 mg total) by mouth daily.   30 capsule   3   . diazepam (VALIUM) 5 MG tablet   Oral   Take 1 tablet (5 mg total) by mouth 2 (two) times daily.   10 tablet   0   . doxepin (SINEQUAN) 10 MG capsule   Oral   Take 10 mg by mouth.         . fluticasone (CUTIVATE) 0.05 % cream  Topical   Apply topically 2 (two) times daily.   30 g   0   . gabapentin (NEURONTIN) 300 MG capsule   Oral   Take 1 capsule (300 mg total) by mouth 3 (three) times daily.   90 capsule   3   . ibuprofen (ADVIL,MOTRIN) 400 MG tablet   Oral   Take 1 tablet (400 mg total) by mouth every 6 (six) hours as needed for pain.   30 tablet   0   . meloxicam (MOBIC) 7.5 MG tablet      1-2 tabs by mouth daily with food as needed for pain. Do not take ibuprofen with this medicine.   30 tablet   0   . nystatin (MYCOSTATIN) powder   Topical   Apply topically 4 (four) times daily. Under the arm and breast area   15 g   3   . nystatin-triamcinolone ointment (MYCOLOG)   Topical   Apply topically 2 (two) times daily.   30 g   0   . oxyCODONE-acetaminophen (ROXICET) 5-325 MG per tablet   Oral   Take 1 tablet by mouth every 8 (eight) hours as needed for pain.   60 tablet   0   . sertraline (ZOLOFT) 50 MG tablet   Oral   Take 50 mg by mouth daily.           Marland Kitchen  terbinafine (LAMISIL) 250 MG tablet   Oral   Take 1 tablet (250 mg total) by mouth daily.   30 tablet   1   . vitamin B-12 (CYANOCOBALAMIN) 1000 MCG tablet   Oral   Take 1,000 mcg by mouth daily.          BP 128/84  Pulse 66  Temp(Src) 98.3 F (36.8 C) (Oral)  Resp 17  SpO2 98% Physical Exam  Nursing note and vitals reviewed. Constitutional: She is oriented to person, place, and time. She appears well-developed and well-nourished.  Neurological: She is alert and oriented to person, place, and time.  Skin: Skin is warm and dry. Rash (erythematous patch to left calf and onycho nail changes.) noted.    ED Course  Procedures (including critical care time) Labs Review Labs Reviewed - No data to display Imaging Review No results found.  MDM   1. Onychomycosis due to dermatophyte   2. Insect bite of leg, left, initial encounter       Linna Hoff, MD 10/01/12 1520

## 2012-10-01 NOTE — ED Notes (Signed)
C/o pain in nails constantly aching unable to sleep at night due to pain. Symptoms present x 2 wks. Was given terbinafine has finished med but still has no relief in pain.   Also c/o pain in right calf x 2 wks. Bruising noted on upper calf. Taking pain meds with no relief. Denies injury.

## 2012-10-08 ENCOUNTER — Telehealth (HOSPITAL_COMMUNITY): Payer: Self-pay | Admitting: *Deleted

## 2012-10-08 NOTE — ED Notes (Signed)
Pt. came back to [facility] and said Medicaid would not pay for the Cutivate cream and it was to expensive.. She has had Betamethasone Valeate 0.01% and paid out of pocket.  She asked if that could be called in.  Discussed with Dr. Artis Flock and he approved Rx change. I called Rx. to the pharmacist at [facility] on [address].  Pt. then asked for multiple refills since the tube was so small and could not f/u with Dermatologist. Can't find one that will take Medicaid.  Discussed with Dr. Artis Flock again and he approved 1 refill.  I called the refill to the pharmacist @ [phone]. Vassie Moselle 10/08/2012

## 2012-10-14 ENCOUNTER — Other Ambulatory Visit: Payer: Self-pay

## 2012-10-14 DIAGNOSIS — Z1231 Encounter for screening mammogram for malignant neoplasm of breast: Secondary | ICD-10-CM

## 2012-11-17 ENCOUNTER — Ambulatory Visit
Admission: RE | Admit: 2012-11-17 | Discharge: 2012-11-17 | Disposition: A | Discharge status: Home / Self Care | Payer: Medicaid Other | Source: Ambulatory Visit

## 2012-11-17 DIAGNOSIS — Z1231 Encounter for screening mammogram for malignant neoplasm of breast: Secondary | ICD-10-CM

## 2013-03-10 ENCOUNTER — Telehealth: Payer: Self-pay | Admitting: Nurse Practitioner

## 2013-03-10 ENCOUNTER — Other Ambulatory Visit: Payer: Self-pay | Admitting: Internal Medicine

## 2013-03-10 DIAGNOSIS — M542 Cervicalgia: Secondary | ICD-10-CM

## 2013-03-10 NOTE — Telephone Encounter (Signed)
Patient came in to clinic and was wondering if she could ask Dr. Laural BenesJohnson about a date change on the paper work he filled out for her. The form is in reference to her [place]. Please advise and call back patient.

## 2013-03-12 ENCOUNTER — Other Ambulatory Visit: Payer: Medicaid Other

## 2013-03-16 ENCOUNTER — Ambulatory Visit: Payer: Medicaid Other

## 2013-03-25 ENCOUNTER — Ambulatory Visit: Payer: Medicaid Other | Admitting: Neurology

## 2013-03-31 ENCOUNTER — Ambulatory Visit: Payer: Medicaid Other | Admitting: Neurology

## 2013-04-01 ENCOUNTER — Encounter: Payer: Self-pay | Admitting: Neurology

## 2013-04-03 ENCOUNTER — Ambulatory Visit: Payer: Self-pay | Admitting: Neurology

## 2013-04-09 ENCOUNTER — Ambulatory Visit (INDEPENDENT_AMBULATORY_CARE_PROVIDER_SITE_OTHER): Payer: Medicaid Other | Admitting: Neurology

## 2013-04-09 ENCOUNTER — Other Ambulatory Visit: Payer: Self-pay | Admitting: Internal Medicine

## 2013-04-09 ENCOUNTER — Encounter (INDEPENDENT_AMBULATORY_CARE_PROVIDER_SITE_OTHER): Payer: Self-pay

## 2013-04-09 ENCOUNTER — Encounter: Payer: Self-pay | Admitting: Neurology

## 2013-04-09 VITALS — BP 116/75 | HR 80 | Ht 66.0 in | Wt 162.0 lb

## 2013-04-09 DIAGNOSIS — M545 Low back pain, unspecified: Secondary | ICD-10-CM

## 2013-04-09 DIAGNOSIS — M542 Cervicalgia: Secondary | ICD-10-CM

## 2013-04-09 NOTE — Progress Notes (Signed)
Reason for visit: Back pain  Kayla Davies is a 58 y.o. female  History of present illness:  Kayla Davies is a 58 year old right-handed [place] female with a history of dizziness, neck pain, and low back pain. The patient has been seen in the past in 2011 mainly for dizziness. The patient underwent MRI evaluation of the cervical spine at that time showing some spondylosis without any surgically amenable problems. The patient underwent epidural steroid injections with some improvement. Since that time, the patient has had increasing problems with low back pain with burning sensations across the back and the mid back. The patient reports discomfort down both legs and burning sensations into the legs that is worse with ambulation. The patient feels better with resting. The patient has generalized fatigue issues. The patient does have some incontinence of bladder and occasionally with the bowel. The patient has been treated with Flexeril, doxepin, gabapentin, and Mobic without much benefit. The patient has Percocet to take if needed as well. The patient is sent back to this office for reevaluation for the low back pain.  Past Medical History  Diagnosis Date  . Obstructive sleep apnea (adult) (pediatric)   . Unspecified vitamin D deficiency   . Edema   . Other malaise and fatigue   . Allergic rhinitis, cause unspecified   . Unspecified asthma(493.90)   . Arthropathy, unspecified, other specified sites   . Scoliosis (and kyphoscoliosis), idiopathic   . 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 Esophageal reflux   . Asymptomatic varicose veins   . Lumbago   . Pain in joint, shoulder region   . Cervicalgia   . Bronchitis   . Reactive airway disease   . Unspecified disorder of lipoid metabolism   . Vertigo     Past Surgical History  Procedure Laterality Date  . Neck surgery      History reviewed. No pertinent family history.  Social history:   reports that she has never smoked. She has never used smokeless tobacco. She reports that she does not drink alcohol or use illicit drugs.  Medications:  Current Outpatient Prescriptions on File Prior to Visit  Medication Sig Dispense Refill  . acetaminophen (TYLENOL) 325 MG tablet Take 2 tablets (650 mg total) by mouth every 6 (six) hours as needed for pain (joint pain).  60 tablet  0  . albuterol (PROVENTIL HFA) 108 (90 BASE) MCG/ACT inhaler Inhale 2 puffs into the lungs every 4 (four) hours as needed. For cough or SOB      . antipyrine-benzocaine (AURALGAN) otic solution Place 3 drops into the right ear every 2 (two) hours as needed for pain.  10 mL  0  . cetirizine (ZYRTEC) 10 MG tablet Take 1 tablet (10 mg total) by mouth daily as needed for allergies.  30 tablet  0  . cyclobenzaprine (FLEXERIL) 10 MG tablet Take 1 tablet (10 mg total) by mouth 2 (two) times daily as needed for muscle spasms.  60 tablet  0  . diazepam (VALIUM) 5 MG tablet Take 1 tablet (5 mg total) by mouth 2 (two) times daily.  10 tablet  0  . doxepin (SINEQUAN) 10 MG capsule Take 10 mg by mouth.      . fluticasone (CUTIVATE) 0.05 % cream Apply topically 2 (two) times daily.  30 g  0  . gabapentin (NEURONTIN) 300 MG capsule Take 1 capsule (300 mg total) by mouth 3 (three) times daily.  90 capsule  3  . ibuprofen (ADVIL,MOTRIN) 400 MG tablet Take 1 tablet (400 mg total) by mouth every 6 (six) hours as needed for pain.  30 tablet  0  . meloxicam (MOBIC) 7.5 MG tablet 1-2 tabs by mouth daily with food as needed for pain. Do not take ibuprofen with this medicine.  30 tablet  0  . nystatin (MYCOSTATIN) powder Apply topically 4 (four) times daily. Under the arm and breast area  15 g  3  . nystatin-triamcinolone ointment (MYCOLOG) Apply topically 2 (two) times daily.  30 g  0  . oxyCODONE-acetaminophen (ROXICET) 5-325 MG per tablet Take 1 tablet by mouth every 8 (eight) hours as needed for pain.  60 tablet  0  . sertraline (ZOLOFT)  50 MG tablet Take 50 mg by mouth daily.        Marland Kitchen terbinafine (LAMISIL) 250 MG tablet Take 1 tablet (250 mg total) by mouth daily.  30 tablet  1  . vitamin B-12 (CYANOCOBALAMIN) 1000 MCG tablet Take 1,000 mcg by mouth daily.      Marland Kitchen dexlansoprazole (DEXILANT) 60 MG capsule Take 1 capsule (60 mg total) by mouth daily.  30 capsule  3  . [DISCONTINUED] pregabalin (LYRICA) 75 MG capsule Take 1 capsule (75 mg total) by mouth 2 (two) times daily.  60 capsule  1   No current facility-administered medications on file prior to visit.     No Known Allergies  ROS:  Out of a complete 14 system review of symptoms, the patient complains only of the following symptoms, and all other reviewed systems are negative.  Fevers, chills, weight loss, fatigue Chest pain, palpitations of the heart, swelling in the legs Hearing loss, ringing in the ears, dizziness, difficulty swallowing Skin rash, itching, moles Blurred vision, loss of vision, eye pain Cough, wheezing, snoring Blood in the stools, diarrhea Urination problems, incontinence Feeling cold, increased thirst Joint pain, joint swelling, muscle cramps, achy muscles Allergies, runny nose, skin sensitivity, frequent infections Memory loss, confusion, headache, weakness, slurred speech, difficulty swallowing, dizziness Depression, anxiety, not enough sleep, decreased energy, change in appetite, disinterest in activities, racing thoughts Insomnia, snoring, restless legs  Blood pressure 116/75, pulse 80, height 5\' 6"  (1.676 m), weight 162 lb (73.483 kg).  Physical Exam  General: The patient is alert and cooperative at the time of the examination.  Eyes: Pupils are equal, round, and reactive to light. Discs are flat bilaterally.  Neck: The neck is supple, no carotid bruits are noted.  Respiratory: The respiratory examination is clear.  Cardiovascular: The cardiovascular examination reveals a regular rate and rhythm, no obvious murmurs or rubs are  noted.  Neuromuscular: The patient has good range of movement of the low back.  Skin: Extremities are without significant edema.  Neurologic Exam  Mental status: The patient is alert and oriented x 3 at the time of the examination. The patient has apparent normal recent and remote memory, with an apparently normal attention span and concentration ability.  Cranial nerves: Facial symmetry is present. There is good sensation of the face to pinprick and soft touch bilaterally. The strength of the facial muscles and the muscles to head turning and shoulder shrug are normal bilaterally. Speech is well enunciated, no aphasia or dysarthria is noted. Extraocular movements are full. Visual fields are full. The tongue is midline, and the patient has symmetric elevation of the soft palate. No obvious hearing deficits are noted.  Motor: The motor testing reveals 5 over 5 strength of all 4 extremities.  Good symmetric motor tone is noted throughout.  Sensory: Sensory testing is intact to pinprick, soft touch, vibration sensation, and position sense on all 4 extremities. No evidence of extinction is noted.   Coordination: Cerebellar testing reveals good finger-nose-finger and heel-to-shin bilaterally.  Gait and station: Gait is normal. Tandem gait is slightly unsteady. Romberg is negative. No drift is seen.  Reflexes: Deep tendon reflexes are symmetric and normal bilaterally. Toes are downgoing bilaterally.   MRI cervical spine July 08/16/2009:  Impression: This MRI scan of the cervical spine show mild spondylitic changes mostly at C5-6 and C6-7 with left lateral disc protrusion at C6-7 and narrowing of foramina .   Assessment/Plan:  1. Low back pain  2. Neck and shoulder discomfort  3. Dizziness  The patient has pain throughout the body that has been present for a number of years. The patient likely has a fibromyalgia type syndrome. The patient will be set up for MRI of the lumbosacral spine,  and she will undergo nerve conduction studies of both legs and one arm, EMG study of both legs. The patient will followup for the EMG evaluation. If significant spondylosis is noted in the low back, epidural steroid injections can be done.  Marlan Palau. Keith Willis MD 04/09/2013 7:06 PM  [facility] [address] [place], [place] [id number]  Phone [phone] Fax [phone]

## 2013-04-09 NOTE — Patient Instructions (Signed)
Back Pain, Adult Low back pain is very common. About 1 in 5 people have back pain.The cause of low back pain is rarely dangerous. The pain often gets better over time.About half of people with a sudden onset of back pain feel better in just 2 weeks. About 8 in 10 people feel better by 6 weeks.  CAUSES Some common causes of back pain include:  Strain of the muscles or ligaments supporting the spine.  Wear and tear (degeneration) of the spinal discs.  Arthritis.  Direct injury to the back. DIAGNOSIS Most of the time, the direct cause of low back pain is not known.However, back pain can be treated effectively even when the exact cause of the pain is unknown.Answering your caregiver's questions about your overall health and symptoms is one of the most accurate ways to make sure the cause of your pain is not dangerous. If your caregiver needs more information, he or she may order lab work or imaging tests (X-rays or MRIs).However, even if imaging tests show changes in your back, this usually does not require surgery. HOME CARE INSTRUCTIONS For many people, back pain returns.Since low back pain is rarely dangerous, it is often a condition that people can learn to manageon their own.   Remain active. It is stressful on the back to sit or stand in one place. Do not sit, drive, or stand in one place for more than 30 minutes at a time. Take short walks on level surfaces as soon as pain allows.Try to increase the length of time you walk each day.  Do not stay in bed.Resting more than 1 or 2 days can delay your recovery.  Do not avoid exercise or work.Your body is made to move.It is not dangerous to be active, even though your back may hurt.Your back will likely heal faster if you return to being active before your pain is gone.  Pay attention to your body when you bend and lift. Many people have less discomfortwhen lifting if they bend their knees, keep the load close to their bodies,and  avoid twisting. Often, the most comfortable positions are those that put less stress on your recovering back.  Find a comfortable position to sleep. Use a firm mattress and lie on your side with your knees slightly bent. If you lie on your back, put a pillow under your knees.  Only take over-the-counter or prescription medicines as directed by your caregiver. Over-the-counter medicines to reduce pain and inflammation are often the most helpful.Your caregiver may prescribe muscle relaxant drugs.These medicines help dull your pain so you can more quickly return to your normal activities and healthy exercise.  Put ice on the injured area.  Put ice in a plastic bag.  Place a towel between your skin and the bag.  Leave the ice on for 15-20 minutes, 03-04 times a day for the first 2 to 3 days. After that, ice and heat may be alternated to reduce pain and spasms.  Ask your caregiver about trying back exercises and gentle massage. This may be of some benefit.  Avoid feeling anxious or stressed.Stress increases muscle tension and can worsen back pain.It is important to recognize when you are anxious or stressed and learn ways to manage it.Exercise is a great option. SEEK MEDICAL CARE IF:  You have pain that is not relieved with rest or medicine.  You have pain that does not improve in 1 week.  You have new symptoms.  You are generally not feeling well. SEEK   IMMEDIATE MEDICAL CARE IF:   You have pain that radiates from your back into your legs.  You develop new bowel or bladder control problems.  You have unusual weakness or numbness in your arms or legs.  You develop nausea or vomiting.  You develop abdominal pain.  You feel faint. Document Released: 01/15/2005 Document Revised: 07/17/2011 Document Reviewed: 06/05/2010 Exit[employer] Patient Information 2014 [employer].  

## 2013-04-13 ENCOUNTER — Encounter (INDEPENDENT_AMBULATORY_CARE_PROVIDER_SITE_OTHER): Payer: Self-pay | Admitting: Radiology

## 2013-04-13 ENCOUNTER — Ambulatory Visit (INDEPENDENT_AMBULATORY_CARE_PROVIDER_SITE_OTHER): Payer: Medicaid Other | Admitting: Neurology

## 2013-04-13 DIAGNOSIS — IMO0001 Reserved for inherently not codable concepts without codable children: Secondary | ICD-10-CM

## 2013-04-13 DIAGNOSIS — Z0289 Encounter for other administrative examinations: Secondary | ICD-10-CM

## 2013-04-13 DIAGNOSIS — M545 Low back pain, unspecified: Secondary | ICD-10-CM

## 2013-04-13 NOTE — Procedures (Signed)
     HISTORY:  Kayla Davies is a 58 year old [place] female with a history of total body pain that also includes the mid back, low back, and both legs. The patient is being evaluated for the back and leg discomfort. The symptoms are quite chronic, and have been present for a number of years.  NERVE CONDUCTION STUDIES:  Nerve conduction studies were performed on the left upper extremity. The distal motor latencies and motor amplitudes for the median and ulnar nerves were within normal limits. The F wave latencies and nerve conduction velocities for these nerves were also normal. The sensory latencies for the median and ulnar nerves were normal.  Nerve conduction studies were performed on both lower extremities. The distal motor latencies and motor amplitudes for the peroneal and posterior tibial nerves were within normal limits. The nerve conduction velocities for these nerves were also normal. The H reflex latencies were normal. The sensory latencies for the peroneal nerves were within normal limits.   EMG STUDIES:  EMG study was performed on the right lower extremity:  The tibialis anterior muscle reveals 2 to 4K motor units with full recruitment. No fibrillations or positive waves were seen. The peroneus tertius muscle reveals 2 to 4K motor units with full recruitment. No fibrillations or positive waves were seen. The medial gastrocnemius muscle reveals 1 to 3K motor units with full recruitment. No fibrillations or positive waves were seen. The vastus lateralis muscle reveals 2 to 4K motor units with full recruitment. No fibrillations or positive waves were seen. The iliopsoas muscle reveals 2 to 4K motor units with full recruitment. No fibrillations or positive waves were seen. The biceps femoris muscle (long head) reveals 2 to 4K motor units with full recruitment. No fibrillations or positive waves were seen. The lumbosacral paraspinal muscles were tested at 3 levels, and revealed no  abnormalities of insertional activity at all 3 levels tested. There was good relaxation.  EMG study was performed on the left lower extremity:  The tibialis anterior muscle reveals 2 to 4K motor units with full recruitment. No fibrillations or positive waves were seen. The peroneus tertius muscle reveals 2 to 4K motor units with full recruitment. No fibrillations or positive waves were seen. The medial gastrocnemius muscle reveals 1 to 3K motor units with full recruitment. No fibrillations or positive waves were seen. The vastus lateralis muscle reveals 2 to 4K motor units with full recruitment. No fibrillations or positive waves were seen. The iliopsoas muscle reveals 2 to 4K motor units with full recruitment. No fibrillations or positive waves were seen. The biceps femoris muscle (long head) reveals 2 to 4K motor units with full recruitment. No fibrillations or positive waves were seen. The lumbosacral paraspinal muscles were tested at 3 levels, and revealed no abnormalities of insertional activity at all 3 levels tested. There was good relaxation.   IMPRESSION:  Nerve conduction studies done on the left upper extremity and both lower extremities were unremarkable, without evidence of a peripheral neuropathy. EMG evaluation of both lower extremities were unremarkable, without evidence of an overlying lumbosacral radiculopathy on either side.  Marlan Palau. Keith Willis MD 04/13/2013 1:04 PM  [facility] [address] [place], [place] [id number]  Phone [phone] Fax [phone]

## 2013-04-13 NOTE — Progress Notes (Signed)
Kayla Davies is a 58 year old patient with a history of total body pain affecting the neck, shoulders, entire spine, and legs. The patient is being evaluated for the back and leg discomfort.  Nerve conduction studies on the left arm and both legs were normal. EMG evaluation of both legs were normal.  The patient likely has fibromyalgia. MRI evaluation of the low back will be done. If this is unremarkable, medical therapy for fibromyalgia will be initiated. The patient will followup in 4 months.

## 2013-04-16 ENCOUNTER — Ambulatory Visit
Admission: RE | Admit: 2013-04-16 | Discharge: 2013-04-16 | Disposition: A | Discharge status: Home / Self Care | Payer: Medicaid Other | Source: Ambulatory Visit | Attending: Internal Medicine | Admitting: Internal Medicine

## 2013-04-16 DIAGNOSIS — M542 Cervicalgia: Secondary | ICD-10-CM

## 2013-04-16 MED ORDER — IOHEXOL 300 MG/ML  SOLN
1.0000 mL | Freq: Once | INTRAMUSCULAR | Status: AC | PRN
  Administered 2013-04-16: 1 mL via EPIDURAL

## 2013-04-16 MED ORDER — TRIAMCINOLONE ACETONIDE 40 MG/ML IJ SUSP (RADIOLOGY)
60.0000 mg | Freq: Once | INTRAMUSCULAR | Status: AC
  Administered 2013-04-16: 60 mg via EPIDURAL

## 2013-04-16 NOTE — Discharge Instructions (Signed)

## 2013-05-01 ENCOUNTER — Inpatient Hospital Stay: Admission: RE | Admit: 2013-05-01 | Payer: Medicaid Other | Source: Ambulatory Visit

## 2013-05-08 ENCOUNTER — Ambulatory Visit
Admission: RE | Admit: 2013-05-08 | Discharge: 2013-05-08 | Disposition: A | Discharge status: Home / Self Care | Payer: Medicaid Other | Source: Ambulatory Visit | Attending: Neurology | Admitting: Neurology

## 2013-05-08 ENCOUNTER — Telehealth: Payer: Self-pay | Admitting: Neurology

## 2013-05-08 DIAGNOSIS — M549 Dorsalgia, unspecified: Secondary | ICD-10-CM

## 2013-05-08 DIAGNOSIS — M545 Low back pain, unspecified: Secondary | ICD-10-CM

## 2013-05-08 NOTE — Telephone Encounter (Signed)
I called patient. The MRI the low back shows some spondylosis, possible impingement of the left L4 nerve root. If desired, epidural steroid injection the low back will be done. The patient had one of the cervical spine done in March of 2015.   MRI lumbosacral spine 05/08/2013:  Impression   Abnormal MRI scan of the lumbar spine showing scoliosis and  spondylitic changes most prominent at L4-5 where there is severe  left-sided foraminal narrowing and likely involvement of the left L4 nerve  root.

## 2013-05-13 ENCOUNTER — Telehealth: Payer: Self-pay | Admitting: Neurology

## 2013-05-13 NOTE — Telephone Encounter (Signed)
I called again, left another message, indicating that the lumbosacral spine MRI showed evidence of possible L4 nerve root impingement off to the left. The patient could benefit from an epidural steroid injection in the low back, she has already had one in the neck. If they are amenable to this procedure, they are to contact our office and leave a message.

## 2013-05-13 NOTE — Telephone Encounter (Signed)
She wants to speak to the Dr about the results message that was left on 4/10. She needs it to be explained to her.

## 2013-05-13 NOTE — Telephone Encounter (Signed)
Pt is calling about the message that was left by Dr. Anne HahnWillis on 05/08/13 concerning her results, pt has questions. Please call. Thanks

## 2013-05-15 ENCOUNTER — Telehealth: Payer: Self-pay | Admitting: Neurology

## 2013-05-15 NOTE — Telephone Encounter (Signed)
Pt is returning Dr. Anne HahnWillis call kept wanting to speak to him now but I advised the Dr. Is with pt's that Dr. Anne HahnWillis or is nurse will be calling her back. PT states she doesn't understand the message that was left. Please have Dr. Anne HahnWillis or his nurse call pt back concerning this matter. Thanks

## 2013-05-15 NOTE — Telephone Encounter (Signed)
I called the patient again for the third time. I once again went over the results of the MRI. If they desire an epidural steroid injection for the back, they are to call our office. I indicated that if they want to call me and talk to me in person, may need to leave a phone number where they can be reached directly. All that I am able to get is an answering machine.

## 2013-05-15 NOTE — Telephone Encounter (Signed)
Pt calling back concerning the message that Dr. Anne HahnWillis left. Would like Dr. Anne HahnWillis to give them a call back please. Thanks

## 2013-06-23 ENCOUNTER — Other Ambulatory Visit: Payer: Self-pay | Admitting: Internal Medicine

## 2013-06-23 DIAGNOSIS — M542 Cervicalgia: Secondary | ICD-10-CM

## 2013-06-24 ENCOUNTER — Other Ambulatory Visit: Payer: Self-pay | Admitting: Internal Medicine

## 2013-06-24 DIAGNOSIS — M549 Dorsalgia, unspecified: Secondary | ICD-10-CM

## 2013-06-25 ENCOUNTER — Other Ambulatory Visit: Payer: Medicaid Other

## 2013-06-25 ENCOUNTER — Ambulatory Visit
Admission: RE | Admit: 2013-06-25 | Discharge: 2013-06-25 | Disposition: A | Discharge status: Home / Self Care | Payer: Medicaid Other | Source: Ambulatory Visit | Attending: Internal Medicine | Admitting: Internal Medicine

## 2013-06-25 VITALS — BP 136/77 | HR 71

## 2013-06-25 DIAGNOSIS — M542 Cervicalgia: Secondary | ICD-10-CM

## 2013-06-25 MED ORDER — TRIAMCINOLONE ACETONIDE 40 MG/ML IJ SUSP (RADIOLOGY)
60.0000 mg | Freq: Once | INTRAMUSCULAR | Status: AC
  Administered 2013-06-25: 60 mg via EPIDURAL

## 2013-06-25 MED ORDER — IOHEXOL 300 MG/ML  SOLN
1.0000 mL | Freq: Once | INTRAMUSCULAR | Status: AC | PRN
  Administered 2013-06-25: 1 mL via EPIDURAL

## 2013-10-01 ENCOUNTER — Other Ambulatory Visit: Payer: Self-pay

## 2013-10-01 DIAGNOSIS — Z1231 Encounter for screening mammogram for malignant neoplasm of breast: Secondary | ICD-10-CM

## 2013-10-26 ENCOUNTER — Other Ambulatory Visit: Payer: Self-pay | Admitting: Internal Medicine

## 2013-10-26 DIAGNOSIS — M542 Cervicalgia: Secondary | ICD-10-CM

## 2013-11-06 ENCOUNTER — Ambulatory Visit
Admission: RE | Admit: 2013-11-06 | Discharge: 2013-11-06 | Disposition: A | Discharge status: Home / Self Care | Payer: Medicaid Other | Source: Ambulatory Visit | Attending: Internal Medicine | Admitting: Internal Medicine

## 2013-11-06 DIAGNOSIS — M542 Cervicalgia: Secondary | ICD-10-CM

## 2013-11-06 MED ORDER — IOHEXOL 300 MG/ML  SOLN
1.0000 mL | Freq: Once | INTRAMUSCULAR | Status: AC | PRN
  Administered 2013-11-06: 1 mL via EPIDURAL

## 2013-11-06 MED ORDER — TRIAMCINOLONE ACETONIDE 40 MG/ML IJ SUSP (RADIOLOGY)
60.0000 mg | Freq: Once | INTRAMUSCULAR | Status: AC
  Administered 2013-11-06: 60 mg via EPIDURAL

## 2013-11-06 NOTE — Discharge Instructions (Signed)

## 2013-11-20 ENCOUNTER — Ambulatory Visit
Admission: RE | Admit: 2013-11-20 | Discharge: 2013-11-20 | Disposition: A | Discharge status: Home / Self Care | Payer: Medicaid Other | Source: Ambulatory Visit

## 2013-11-20 ENCOUNTER — Ambulatory Visit: Payer: Medicaid Other

## 2013-11-20 DIAGNOSIS — Z1231 Encounter for screening mammogram for malignant neoplasm of breast: Secondary | ICD-10-CM

## 2013-11-24 ENCOUNTER — Other Ambulatory Visit: Payer: Self-pay | Admitting: Internal Medicine

## 2013-11-24 DIAGNOSIS — R928 Other abnormal and inconclusive findings on diagnostic imaging of breast: Secondary | ICD-10-CM

## 2013-12-10 ENCOUNTER — Other Ambulatory Visit: Payer: Medicaid Other

## 2013-12-11 ENCOUNTER — Ambulatory Visit
Admission: RE | Admit: 2013-12-11 | Discharge: 2013-12-11 | Disposition: A | Discharge status: Home / Self Care | Payer: Medicaid Other | Source: Ambulatory Visit | Attending: Internal Medicine | Admitting: Internal Medicine

## 2013-12-11 DIAGNOSIS — R928 Other abnormal and inconclusive findings on diagnostic imaging of breast: Secondary | ICD-10-CM

## 2013-12-16 ENCOUNTER — Encounter: Payer: Self-pay | Admitting: Neurology

## 2013-12-22 ENCOUNTER — Encounter: Payer: Self-pay | Admitting: Neurology

## 2014-10-06 ENCOUNTER — Ambulatory Visit (INDEPENDENT_AMBULATORY_CARE_PROVIDER_SITE_OTHER): Payer: Medicaid Other | Admitting: Neurology

## 2014-10-06 ENCOUNTER — Encounter: Payer: Self-pay | Admitting: Neurology

## 2014-10-06 VITALS — BP 124/75 | HR 76 | Resp 20 | Ht 67.0 in

## 2014-10-06 DIAGNOSIS — F068 Other specified mental disorders due to known physiological condition: Secondary | ICD-10-CM

## 2014-10-06 DIAGNOSIS — G44209 Tension-type headache, unspecified, not intractable: Secondary | ICD-10-CM

## 2014-10-06 DIAGNOSIS — M542 Cervicalgia: Secondary | ICD-10-CM

## 2014-10-06 DIAGNOSIS — F32A Depression, unspecified: Secondary | ICD-10-CM | POA: Insufficient documentation

## 2014-10-06 DIAGNOSIS — G4710 Hypersomnia, unspecified: Secondary | ICD-10-CM | POA: Diagnosis not present

## 2014-10-06 DIAGNOSIS — R0683 Snoring: Secondary | ICD-10-CM | POA: Diagnosis not present

## 2014-10-06 DIAGNOSIS — F329 Major depressive disorder, single episode, unspecified: Secondary | ICD-10-CM | POA: Diagnosis not present

## 2014-10-06 DIAGNOSIS — G4730 Sleep apnea, unspecified: Secondary | ICD-10-CM

## 2014-10-06 DIAGNOSIS — F0390 Unspecified dementia without behavioral disturbance: Secondary | ICD-10-CM | POA: Insufficient documentation

## 2014-10-06 MED ORDER — AMITRIPTYLINE HCL 25 MG PO TABS: 25.0000 mg | ORAL_TABLET | Freq: Every evening | ORAL | Status: DC | PRN

## 2014-10-06 NOTE — Progress Notes (Signed)
SLEEP MED[facility]INE CL[facility]IC   Provider:  Melvyn Novas, M D  Referring Provider: Gwenyth Bender, MD  Primary Care Physician:  Willey Blade, MD  Kayla Sago, MD primary neurologist .   Chief Complaint  Patient presents with  . New Evaluation    Sleep Consult; patient has been tired, loss of appetite and headaches   Chief complaint according to patient : "According to the daughter the patient has a low energy feels tired, is fatigued and sleepy, she also lost her appetite." The daughter translates the visit. Depression is present.   HPI:  Kayla Davies is a 59 y.o. female , [place], seen here in the presence of her daughter as a referral  from Dr. August Davies for a sleep evaluation,  [name] has been established with another provider in the office, Dr. Lesia Davies who saw her for lumbago 2 years ago. She has been on Dexilant , doxepin at night and vitamin D supplement  She has a reported medical history of allergic rhinitis,  Asthma , GERD, memory loss- diagnosed 2 years ago as Alzheimer's dementia, foraminal stenosis of the lumbosacral region, joint pain affecting knee and hip and lower back vitamin D deficiency, headaches. The patient according to her daughter snores at night and has likely a history of sleep apnea , but she has not had a recent evaluation. She acknowledges that she feels depressed. This may also have caused her appetite or change.  Sleep habits are as follows: The patient states that she has variable bedtimes she believes that she mostly goes to bed around 9 PM, the patient states that she does not fall immediately asleep but often headaches bother her from going to sleep. In some extreme circumstances she may stay up until 2 AM while in bed waiting to fall asleep. She does not watch TV in bed and her bedroom is cool, quiet and dark. She sleeps alone in the same room. Even if she struggles for a long time to fall asleep she cannot necessarily stay asleep  throughout the night and she usually wakes up 2 or 3 times age time was trouble to go back to sleep. He reports that she has to go to the bathroom when she wakes up. She usually wakes up around 6 AM spontaneously without any alarm and she will remain awake until 11 or 11:30. Then she gets tired again and may nap in daytime. When she wakes up at 6 AM she does not feel refreshed or restored. She endorsed that her joints hurt , her neck is stiff , her shoulders hurt and she has headaches. In daytime she may try to nap 3 or 4 times but she feels that she is always just drowsy but not actually entering sleep. Her nocturnal sleep is marked by loud snoring but the daughter has heard. Is also irregular breathing. The daughter said that it is extremely loud and that she can hear it in another room across the hallway.  Reviewed the patient's medication list. An attempt to perform a Montral cognitive assessment test was made but not completed I do not see an attempt to do a Mini-Mental Status evaluation which is usually the alternative. The geriatric depression score was endorsed at 5 points the Epworth sleepiness score at 12 points fatigue severity score at 58 points.    Sleep medical history and family sleep history: grandparents , maternal snored.  Social history: lives in [place] for 7 years , from [place]. Lives  with daughter and 4 grand children.  No caffeine intake.   Review of Systems: Out of a complete 14 system review, the patient complains of only the following symptoms, and all other reviewed systems are negative. Snoring, sleepiness, fatigue , depression.     Social History   Social History  . Marital Status: Single    Spouse Name: N/A  . Number of Children: 3  . Years of Education: 0   Occupational History  . unemployed    Social History Main Topics  . Smoking status: Never Smoker   . Smokeless tobacco: Never Used  . Alcohol Use: No  . Drug Use: No  . Sexual Activity: No   Other  Topics Concern  . Not on file   Social History Narrative   Lives at home with daughter    History reviewed. No pertinent family history.  Past Medical History  Diagnosis Date  . Obstructive sleep apnea (adult) (pediatric)   . Unspecified vitamin D deficiency   . Edema   . Other malaise and fatigue   . Allergic rhinitis, cause unspecified   . Unspecified asthma(493.90)   . Arthropathy, unspecified, other specified sites   . Scoliosis (and kyphoscoliosis), idiopathic   . 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 Esophageal reflux   . Asymptomatic varicose veins   . Lumbago   . Pain in joint, shoulder region   . Cervicalgia   . Bronchitis   . Reactive airway disease   . Unspecified disorder of lipoid metabolism   . Vertigo   . Radiculopathy of lumbar region   . Bladder incontinence   . Bowel incontinence     Past Surgical History  Procedure Laterality Date  . Neck surgery      Current Outpatient Prescriptions  Medication Sig Dispense Refill  . acetaminophen (TYLENOL) 325 MG tablet Take 2 tablets (650 mg total) by mouth every 6 (six) hours as needed for pain (joint pain). 60 tablet 0  . albuterol (PROVENTIL HFA) 108 (90 BASE) MCG/ACT inhaler Inhale 2 puffs into the lungs every 4 (four) hours as needed. For cough or SOB    . antipyrine-benzocaine (AURALGAN) otic solution Place 3 drops into the right ear every 2 (two) hours as needed for pain. 10 mL 0  . cetirizine (ZYRTEC) 10 MG tablet Take 1 tablet (10 mg total) by mouth daily as needed for allergies. 30 tablet 0  . clotrimazole-betamethasone (LOTRISONE) cream APP AA AND SURROUNDING AREAS BID IN THE MORNING AND EVENING FOR 2 WEEKS  2  . cyclobenzaprine (FLEXERIL) 10 MG tablet Take 1 tablet (10 mg total) by mouth 2 (two) times daily as needed for muscle spasms. 60 tablet 0  . DEXILANT 60 MG capsule TK 1 C PO QD  3  . diazepam (VALIUM) 5 MG tablet Take 1 tablet (5 mg total) by mouth 2 (two)  times daily. 10 tablet 0  . doxepin (SINEQUAN) 10 MG capsule Take 10 mg by mouth.    . fluticasone (CUTIVATE) 0.05 % cream Apply topically 2 (two) times daily. 30 g 0  . gabapentin (NEURONTIN) 300 MG capsule Take 1 capsule (300 mg total) by mouth 3 (three) times daily. 90 capsule 3  . ibuprofen (ADVIL,MOTRIN) 400 MG tablet Take 1 tablet (400 mg total) by mouth every 6 (six) hours as needed for pain. 30 tablet 0  . meloxicam (MOBIC) 7.5 MG tablet 1-2 tabs by mouth daily with food as needed for pain. Do not take  ibuprofen with this medicine. 30 tablet 0  . nystatin (MYCOSTATIN) powder Apply topically 4 (four) times daily. Under the arm and breast area 15 g 3  . nystatin-triamcinolone ointment (MYCOLOG) Apply topically 2 (two) times daily. 30 g 0  . oxyCODONE-acetaminophen (ROXICET) 5-325 MG per tablet Take 1 tablet by mouth every 8 (eight) hours as needed for pain. 60 tablet 0  . sertraline (ZOLOFT) 50 MG tablet Take 50 mg by mouth daily.      Marland Kitchen terbinafine (LAMISIL) 250 MG tablet Take 1 tablet (250 mg total) by mouth daily. 30 tablet 1  . vitamin B-12 (CYANOCOBALAMIN) 1000 MCG tablet Take 1,000 mcg by mouth daily.    Marland Kitchen dexlansoprazole (DEXILANT) 60 MG capsule Take 1 capsule (60 mg total) by mouth daily. 30 capsule 3  . [DISCONTINUED] pregabalin (LYRICA) 75 MG capsule Take 1 capsule (75 mg total) by mouth 2 (two) times daily. 60 capsule 1   No current facility-administered medications for this visit.    Allergies as of 10/06/2014  . (No Known Allergies)    Vitals: BP 124/75 mmHg  Pulse 76  Resp 20  Ht  (1.702 m) Last Weight:  Wt Readings from Last 1 Encounters:  04/09/13 162 lb (73.483 kg)   RUE:AVWUJ is no weight on file to calculate BMI.      Weight ;  165 pounds.   Ht Readings from Last 1 Encounters:  10/06/14  (1.702 m)    Physical exam:  General: The patient is awake, alert and appears not in acute distress. The patient is well groomed. Head: Normocephalic,  atraumatic. Neck is supple. Mallampati 4,  neck circumference: 13 Nasal airflow unrestricited , TMJ click is  evident . Retrognathia is seen.  Cardiovascular:  Regular rate and rhythm, without  murmurs or carotid bruit, and without distended neck veins. Respiratory: Lungs are clear to auscultation. Skin:  Without evidence of edema, or rash Trunk: BMI is . The patient's posture is hunched.    Neurologic exam : The patient is awake and alert, oriented to place and time.   Memory subjective described as impaired , but she feels too fatigued to concentrate.  Memory testing revealed - 16 out of 30 points,  Likely invalid due to language barrier.  MMSE:No flowsheet data found.    Attention span & concentration ability appears normal.  Speech is fluent,  without dysarthria, dysphonia or aphasia. Limited validity  Mood and affect are appropriate.  Cranial nerves: Pupils are equal and briskly reactive to light. 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 question of pallor or edema. Extraocular movements  in vertical and horizontal planes intact and without nystagmus. Visual fields by finger perimetry are intact. Hearing to finger rub intact.   Facial sensation intact to fine touch.  Facial motor strength is symmetric and tongue and uvula move midline. Shoulder shrug was symmetrical.   Motor exam:   Normal tone, muscle bulk and symmetric strength in all extremities.  Sensory:  Fine touch, pinprick and vibration were tested in all extremities. Proprioception tested in the upper extremities was normal.  Coordination: Rapid alternating movements in the fingers/hands was normal. Finger-to-nose maneuver normal without evidence of ataxia, dysmetria or tremor.  Gait and station: Patient walks without assistive device and is able unassisted to climb up to the exam table. Strength within normal limits.  Stance is stable and normal.   Toe and hell stand were tested  .Tandem gait is unfragmented.  Turns with 4  Steps. Romberg testing is  negative.  Deep tendon reflexes: in the  upper and lower extremities are symmetric and intact. Babinski maneuver response is downgoing.  The patient was advised of the nature of the diagnosed sleep disorder , the treatment options and risks for general a health and wellness arising from not treating the condition.  I spent more than 60  minutes of face to face time with the patient.  This was prolonged due to language barrier, memory deficit and translator delay.  Greater than 50% of time was spent in counseling and coordination of care. We have discussed the diagnosis and differential and I answered the patient's questions.     Assessment:  After physical and neurologic examination, review of laboratory studies, 60 minute visit.  Personal review of imaging studies, reports of other /same  Imaging studies ,  Results of polysomnography/ neurophysiology testing and pre-existing records as far as provided in visit., my assessment is   1) the patient is very fatigued which is evident here but I think she is severely deeply depressed. I'm not sure if her memory testing is valid or if this is a pseudodementia. The inability to follow the instruction otherwise can also be explained by the language barrier. I think that the patient should be treated for a major depression.  2)Again,  I'm not sure that there is clinical evidence for a dementia or not, she has had no brain imaging studies at this point which I will order.  She has been on Aricept 10 mg 10 mg daily since February and she was placed on doxepin. I think she may be benefiting from a SSRI more and I do wonder if she should be on a twice cyclic antidepressives and at night to allow her sleep such as Elavil.   3)My observation is also in relation to her neck pain the stiffness around the trapezius and deltoid muscles she has very tense trigger points and she has been unable to  sleepbecause of pain from these regions. Possible tension headaches have also to be considered.  4) the patient is primarily referred for a sleep disorder evaluation I will initiate a home sleep test for her to screen her for apnea. I would like some sleep hygiene rules to be implemented it may be beneficial if she cuts out all caffeine from the early afternoon on. I like for her to hydrate was water in the afternoon and evening.       Plan:  Treatment plan and additional workup :  SPLIT night polysomnography. Referral to psychiatry / psychology strongly recommended. Depression is severe.  Follow up with Primary neurologist ; tension headaches and back pain. Elavil trial at night.  MRI brain.   Porfirio Mylar Eknoor Novack MD  10/06/2014   CC: Gwenyth Bender, Md [address] [place], [place] [address]

## 2014-10-06 NOTE — Patient Instructions (Addendum)
Depression Depression refers to feeling sad, low, down in the dumps, blue, gloomy, or empty. In general, there are two kinds of depression: 1. Normal sadness or normal grief. This kind of depression is one that we all feel from time to time after upsetting life experiences, such as the loss of a job or the ending of a relationship. This kind of depression is considered normal, is short lived, and resolves within a few days to 2 weeks. Depression experienced after the loss of a loved one (bereavement) often lasts longer than 2 weeks but normally gets better with time. 2. Clinical depression. This kind of depression lasts longer than normal sadness or normal grief or interferes with your ability to function at home, at work, and in school. It also interferes with your personal relationships. It affects almost every aspect of your life. Clinical depression is an illness. Symptoms of depression can also be caused by conditions other than those mentioned above, such as:  Physical illness. Some physical illnesses, including underactive thyroid gland (hypothyroidism), severe anemia, specific types of cancer, diabetes, uncontrolled seizures, heart and lung problems, strokes, and chronic pain are commonly associated with symptoms of depression.  Side effects of some prescription medicine. In some people, certain types of medicine can cause symptoms of depression.  Substance abuse. Abuse of alcohol and illicit drugs can cause symptoms of depression. SYMPTOMS Symptoms of normal sadness and normal grief include the following:  Feeling sad or crying for short periods of time.  Not caring about anything (apathy).  Difficulty sleeping or sleeping too much.  No longer able to enjoy the things you used to enjoy.  Desire to be by oneself all the time (social isolation).  Lack of energy or motivation.  Difficulty concentrating or remembering.  Change in appetite or weight.  Restlessness or  agitation. Symptoms of clinical depression include the same symptoms of normal sadness or normal grief and also the following symptoms:  Feeling sad or crying all the time.  Feelings of guilt or worthlessness.  Feelings of hopelessness or helplessness.  Thoughts of suicide or the desire to harm yourself (suicidal ideation).  Loss of touch with reality (psychotic symptoms). Seeing or hearing things that are not real (hallucinations) or having false beliefs about your life or the people around you (delusions and paranoia). DIAGNOSIS  The diagnosis of clinical depression is usually based on how bad the symptoms are and how long they have lasted. Your health care provider will also ask you questions about your medical history and substance use to find out if physical illness, use of prescription medicine, or substance abuse is causing your depression. Your health care provider may also order blood tests. TREATMENT  Often, normal sadness and normal grief do not require treatment. However, sometimes antidepressant medicine is given for bereavement to ease the depressive symptoms until they resolve. The treatment for clinical depression depends on how bad the symptoms are but often includes antidepressant medicine, counseling with a mental health professional, or both. Your health care provider will help to determine what treatment is best for you. Depression caused by physical illness usually goes away with appropriate medical treatment of the illness. If prescription medicine is causing depression, talk with your health care provider about stopping the medicine, decreasing the dose, or changing to another medicine. Depression caused by the abuse of alcohol or illicit drugs goes away when you stop using these substances. Some adults need professional help in order to stop drinking or using drugs. SEEK IMMEDIATE MEDICAL   CARE IF:  You have thoughts about hurting yourself or others.  You lose touch  with reality (have psychotic symptoms).  You are taking medicine for depression and have a serious side effect. FOR MORE INFORMATION  National [employer] on Mental Illness: www.nami.[employer] of Mental [employer]: http://www.maynard.net/ Document Released: 01/13/2000 Document Revised: 06/01/2013 Document Reviewed: 04/16/2011 [facility] Patient Information 2015 [place], [place]. This information is not intended to replace advice given to you by your health care provider. Make sure you discuss any questions you have with your health care provider. Amitriptyline tablets What is this medicine? AMITRIPTYLINE (a mee TRIP ti leen) is used to treat depression. This medicine may be used for other purposes; ask your health care provider or pharmacist if you have questions. COMMON BRAND NAME(S): Elavil, Vanatrip What should I tell my health care provider before I take this medicine? They need to know if you have any of these conditions: -an alcohol problem -asthma, difficulty breathing -bipolar disorder or schizophrenia -difficulty passing urine, prostate trouble -glaucoma -heart disease or previous heart attack -liver disease -over active thyroid -seizures -thoughts or plans of suicide, a previous suicide attempt, or family history of suicide attempt -an unusual or allergic reaction to amitriptyline, other medicines, foods, dyes, or preservatives -pregnant or trying to get pregnant -breast-feeding How should I use this medicine? Take this medicine by mouth with a drink of water. Follow the directions on the prescription label. You can take the tablets with or without food. Take your medicine at regular intervals. Do not take it more often than directed. Do not stop taking this medicine suddenly except upon the advice of your doctor. Stopping this medicine too quickly may cause serious side effects or your condition may worsen. A special MedGuide will be given to you by the pharmacist with each  prescription and refill. Be sure to read this information carefully each time. Talk to your pediatrician regarding the use of this medicine in children. Special care may be needed. Overdosage: If you think you have taken too much of this medicine contact a poison control center or emergency room at once. NOTE: This medicine is only for you. Do not share this medicine with others. What if I miss a dose? If you miss a dose, take it as soon as you can. If it is almost time for your next dose, take only that dose. Do not take double or extra doses. What may interact with this medicine? Do not take this medicine with any of the following medications: -arsenic trioxide -certain medicines used to regulate abnormal heartbeat or to treat other heart conditions -cisapride -droperidol -halofantrine -linezolid -MAOIs like Carbex, Eldepryl, Marplan, Nardil, and Parnate -methylene blue -other medicines for mental depression -phenothiazines like perphenazine, thioridazine and chlorpromazine -pimozide -probucol -procarbazine -sparfloxacin -St. John's Wort -ziprasidone This medicine may also interact with the following medications: -atropine and related drugs like hyoscyamine, scopolamine, tolterodine and others -barbiturate medicines for inducing sleep or treating seizures, like phenobarbital -cimetidine -disulfiram -ethchlorvynol -thyroid hormones such as levothyroxine This list may not describe all possible interactions. Give your health care provider a list of all the medicines, herbs, non-prescription drugs, or dietary supplements you use. Also tell them if you smoke, drink alcohol, or use illegal drugs. Some items may interact with your medicine. What should I watch for while using this medicine? Tell your doctor if your symptoms do not get better or if they get worse. Visit your doctor or health care professional for regular checks on your progress.  Because it may take several weeks to see the  full effects of this medicine, it is important to continue your treatment as prescribed by your doctor. Patients and their families should watch out for new or worsening thoughts of suicide or depression. Also watch out for sudden changes in feelings such as feeling anxious, agitated, panicky, irritable, hostile, aggressive, impulsive, severely restless, overly excited and hyperactive, or not being able to sleep. If this happens, especially at the beginning of treatment or after a change in dose, call your health care professional. Bonita Quin may get drowsy or dizzy. Do not drive, use machinery, or do anything that needs mental alertness until you know how this medicine affects you. Do not stand or sit up quickly, especially if you are an older patient. This reduces the risk of dizzy or fainting spells. Alcohol may interfere with the effect of this medicine. Avoid alcoholic drinks. Do not treat yourself for coughs, colds, or allergies without asking your doctor or health care professional for advice. Some ingredients can increase possible side effects. Your mouth may get dry. Chewing sugarless gum or sucking hard candy, and drinking plenty of water will help. Contact your doctor if the problem does not go away or is severe. This medicine may cause dry eyes and blurred vision. If you wear contact lenses you may feel some discomfort. Lubricating drops may help. See your eye doctor if the problem does not go away or is severe. This medicine can cause constipation. Try to have a bowel movement at least every 2 to 3 days. If you do not have a bowel movement for 3 days, call your doctor or health care professional. This medicine can make you more sensitive to the sun. Keep out of the sun. If you cannot avoid being in the sun, wear protective clothing and use sunscreen. Do not use sun lamps or tanning beds/booths. What side effects may I notice from receiving this medicine? Side effects that you should report to your  doctor or health care professional as soon as possible: -allergic reactions like skin rash, itching or hives, swelling of the face, lips, or tongue -abnormal production of milk in females -breast enlargement in both males and females -breathing problems -confusion, hallucinations -fast, irregular heartbeat -fever with increased sweating -muscle stiffness, or spasms -pain or difficulty passing urine, loss of bladder control -seizures -suicidal thoughts or other mood changes -swelling of the testicles -tingling, pain, or numbness in the feet or hands -yellowing of the eyes or skin Side effects that usually do not require medical attention (report to your doctor or health care professional if they continue or are bothersome): -change in sex drive or performance -constipation or diarrhea -nausea, vomiting -weight gain or loss This list may not describe all possible side effects. Call your doctor for medical advice about side effects. You may report side effects to FDA at 1-800-FDA-1088. Where should I keep my medicine? Keep out of the reach of children. Store at room temperature between 20 and 25 degrees C (68 and 77 degrees F). Throw away any unused medicine after the expiration date. NOTE: This sheet is a summary. It may not cover all possible information. If you have questions about this medicine, talk to your doctor, pharmacist, or health care provider.  2015, [employer]. (2011-06-04 13:50:32)  I will follow up with Mrs. Demos sleep study which I ordered today. I also gave her a different medication to help her sleep, Elavil which will replace the trazodone.   I would  not change the donepezil at this time but ask her to take Zoloft 100 mg instead of 50 mg. I assume that the patient is severely majorly depressed. Sufficient depression treatment could improve her cognitive performance and her sleep habits.   It may also work on her tension headaches. For her arthritis and  pain related issues she may return to Dr. Anne Hahn if my medication approach fails to improve.

## 2014-11-12 ENCOUNTER — Ambulatory Visit (INDEPENDENT_AMBULATORY_CARE_PROVIDER_SITE_OTHER): Payer: Medicaid Other | Admitting: Neurology

## 2014-11-12 DIAGNOSIS — G4730 Sleep apnea, unspecified: Secondary | ICD-10-CM

## 2014-11-12 DIAGNOSIS — G4710 Hypersomnia, unspecified: Secondary | ICD-10-CM

## 2014-11-12 DIAGNOSIS — F329 Major depressive disorder, single episode, unspecified: Secondary | ICD-10-CM

## 2014-11-12 DIAGNOSIS — F32A Depression, unspecified: Secondary | ICD-10-CM

## 2014-11-12 DIAGNOSIS — F0390 Unspecified dementia without behavioral disturbance: Secondary | ICD-10-CM

## 2014-11-13 NOTE — Sleep Study (Signed)
Please see the scanned sleep study interpretation located in the procedure tab within the chart review section.   

## 2014-11-18 ENCOUNTER — Telehealth: Payer: Self-pay

## 2014-11-18 NOTE — Telephone Encounter (Signed)
Called pt to give her sleep study results. No answer, left a message asking her to call me back. 

## 2014-11-22 NOTE — Telephone Encounter (Signed)
Patient returned call, please call [phone]

## 2014-11-22 NOTE — Telephone Encounter (Signed)
I called [facility] [employer] [facility] and advised them of this appt. They said they will try to get an interpreter but if they are unable to get an interpreter, to use the language line.

## 2014-11-22 NOTE — Telephone Encounter (Signed)
I spoke to pt again. Pt refused to let me call the interpreter service again. I advised her that I needed to make a follow up appointment with her to talk about her treatment options. Appt made for 11/16 at 10:30. Pt verbalized understanding.

## 2014-11-22 NOTE — Telephone Encounter (Signed)
I called pt using Interpre[employer] service. Interpreter sarah [phone].  I advised pt that in her sleep study results, mild osa is found and treatment is advised. cpap therapy is optional, and that alternative theriapies include an oral appliance or ENT evaluation. At this point, the call dropped.  I called interpreter service back, and the interpreter tried to get the pt back on the line but was unsuccessful, so she left a VM asking the pt to call me back.  I was going to offer the pt a f/u appt to discuss these results with Dr. Vickey Hugerohmeier before she makes a decision.  I also left a message with [place], daughter, (per DPR) to please call me back.

## 2014-12-15 ENCOUNTER — Encounter: Payer: Self-pay | Admitting: Neurology

## 2014-12-15 ENCOUNTER — Ambulatory Visit (INDEPENDENT_AMBULATORY_CARE_PROVIDER_SITE_OTHER): Payer: Medicaid Other | Admitting: Neurology

## 2014-12-15 VITALS — BP 108/70 | HR 78 | Resp 20 | Ht 66.0 in | Wt 162.0 lb

## 2014-12-15 DIAGNOSIS — R0683 Snoring: Secondary | ICD-10-CM

## 2014-12-15 DIAGNOSIS — J4541 Moderate persistent asthma with (acute) exacerbation: Secondary | ICD-10-CM

## 2014-12-15 DIAGNOSIS — G4733 Obstructive sleep apnea (adult) (pediatric): Secondary | ICD-10-CM

## 2014-12-15 MED ORDER — AMITRIPTYLINE HCL 25 MG PO TABS: 25.0000 mg | ORAL_TABLET | Freq: Every evening | ORAL | Status: DC | PRN

## 2014-12-15 NOTE — Patient Instructions (Signed)

## 2014-12-15 NOTE — Progress Notes (Signed)
SLEEP MEDICINE CLINIC   Provider:  Melvyn Davies, M D  Referring Provider: Gwenyth Bender, MD  Primary Care Physician:  Kayla Blade, MD  Kayla Sago, MD primary neurologist .   Chief Complaint  Patient presents with  . Follow-up    sleep study results, rm 10, with daughter translating, pt has signed the waiver allowing daughter to translate knowing that we have free interpreter service available   Chief complaint according to patient : "According to the daughter the patient has a low energy feels tired, is fatigued and sleepy, she also lost her appetite." The daughter translates the visit. Depression is present.   HPI:  Kayla Davies is a 59 y.o. female, [place], seen here in the presence of her daughter as a referral  from Dr. August Davies for a sleep evaluation,  Kayla Davies has been established with another provider in the office, Dr. Lesia Davies who saw her for lumbago 2 years ago. She has been on Dexilant, Doxepin at night and vitamin D supplement  She has a reported medical history of allergic rhinitis,  Asthma , GERD, memory loss- diagnosed 2 years ago as Alzheimer's dementia, foraminal stenosis of the lumbosacral region, joint pain affecting knee and hip and lower back vitamin D deficiency, headaches. The patient according to her daughter snores at night and has likely a history of sleep apnea , but she has not had a recent evaluation. She acknowledges that she feels depressed. This may also have caused her appetite or change.  Sleep habits are as follows: The patient states that she has variable bedtimes she believes that she mostly goes to bed around 9 PM, the patient states that she does not fall immediately asleep but often headaches bother her from going to sleep. In some extreme circumstances she may stay up until 2 AM while in bed waiting to fall asleep. She does not watch TV in bed and her bedroom is cool, quiet and dark. She sleeps alone in the same room.  Even if she struggles for a long time to fall asleep she cannot necessarily stay asleep throughout the night and she usually wakes up 2 or 3 times age time was trouble to go back to sleep. He reports that she has to go to the bathroom when she wakes up. She usually wakes up around 6 AM spontaneously without any alarm and she will remain awake until 11 or 11:30. Then she gets tired again and may nap in daytime. When she wakes up at 6 AM she does not feel refreshed or restored. She endorsed that her joints hurt , her neck is stiff , her shoulders hurt and she has headaches. In daytime she may try to nap 3 or 4 times but she feels that she is always just drowsy but not actually entering sleep. Her nocturnal sleep is marked by loud snoring but the daughter has heard. Is also irregular breathing. The daughter said that it is extremely loud and that she can hear it in another room across the hallway.  Reviewed the patient's medication list. An attempt to perform a Montral cognitive assessment test was made but not completed I do not see an attempt to do a Mini-Mental Status evaluation which is usually the alternative. The geriatric depression score was endorsed at 5 points the Epworth sleepiness score at 12 points fatigue severity score at 58 points.  Sleep medical history and family sleep history: grandparents , maternal snored.  Social history: lives  in [place] for 7 years , from [place]. Lives with daughter and 4 grand children.  No caffeine intake.    12-15-14 Kayla Davies is seen here today on 11-16 16 to follow-up on her recent sleep study performed on 11-12-14. She had been referred by Dr. August Davies. Her AHI was 13.8 and she slept all night on her side or prone. There was no accentuation with position noted. She slept for about 72% of the recorded time and had a long latency to dream sleep. REM sleep was first seen after 209 minutes of sleep. Oxygen desaturation was noted but was not maintained for a long time the  nadir was 67% was 24.6 minutes of desaturation time total. There were no significant leg movements most arousals were spontaneous. Her daughter is most concerned that whatever device will be used in the treatment also addresses her mother's snoring. Snoring is "thunderous"and can be heard 3 rooms away at night..  Review of Systems: Out of a complete 14 system review, the patient complains of only the following symptoms, and all other reviewed systems are negative. Snoring, sleepiness, fatigue , depression. Fragmented sleep.     Social History   Social History  . Marital Status: Single    Spouse Name: N/A  . Number of Children: 3  . Years of Education: 0   Occupational History  . unemployed    Social History Main Topics  . Smoking status: Never Smoker   . Smokeless tobacco: Never Used  . Alcohol Use: No  . Drug Use: No  . Sexual Activity: No   Other Topics Concern  . Not on file   Social History Narrative   Lives at home with daughter    History reviewed. No pertinent family history.  Past Medical History  Diagnosis Date  . Obstructive sleep apnea (adult) (pediatric)   . Unspecified vitamin D deficiency   . Edema   . Other malaise and fatigue   . Allergic rhinitis, cause unspecified   . Unspecified asthma(493.90)   . Arthropathy, unspecified, other specified sites   . Scoliosis (and kyphoscoliosis), idiopathic   . 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 Esophageal reflux   . Asymptomatic varicose veins   . Lumbago   . Pain in joint, shoulder region   . Cervicalgia   . Bronchitis   . Reactive airway disease   . Unspecified disorder of lipoid metabolism   . Vertigo   . Radiculopathy of lumbar region   . Bladder incontinence   . Bowel incontinence     Past Surgical History  Procedure Laterality Date  . Neck surgery      Current Outpatient Prescriptions  Medication Sig Dispense Refill  . acetaminophen (TYLENOL) 325 MG  tablet Take 2 tablets (650 mg total) by mouth every 6 (six) hours as needed for pain (joint pain). 60 tablet 0  . albuterol (PROVENTIL HFA) 108 (90 BASE) MCG/ACT inhaler Inhale 2 puffs into the lungs every 4 (four) hours as needed. For cough or SOB    . amitriptyline (ELAVIL) 25 MG tablet Take 1 tablet (25 mg total) by mouth at bedtime as needed for sleep. 30 tablet 3  . antipyrine-benzocaine (AURALGAN) otic solution Place 3 drops into the right ear every 2 (two) hours as needed for pain. 10 mL 0  . cetirizine (ZYRTEC) 10 MG tablet Take 1 tablet (10 mg total) by mouth daily as needed for allergies. 30 tablet 0  . clotrimazole-betamethasone (LOTRISONE) cream APP AA  AND SURROUNDING AREAS BID IN THE MORNING AND EVENING FOR 2 WEEKS  2  . cyclobenzaprine (FLEXERIL) 10 MG tablet Take 1 tablet (10 mg total) by mouth 2 (two) times daily as needed for muscle spasms. 60 tablet 0  . DEXILANT 60 MG capsule TK 1 C PO QD  3  . diazepam (VALIUM) 5 MG tablet Take 1 tablet (5 mg total) by mouth 2 (two) times daily. 10 tablet 0  . fluticasone (CUTIVATE) 0.05 % cream Apply topically 2 (two) times daily. 30 g 0  . gabapentin (NEURONTIN) 300 MG capsule Take 1 capsule (300 mg total) by mouth 3 (three) times daily. 90 capsule 3  . ibuprofen (ADVIL,MOTRIN) 400 MG tablet Take 1 tablet (400 mg total) by mouth every 6 (six) hours as needed for pain. 30 tablet 0  . meloxicam (MOBIC) 7.5 MG tablet 1-2 tabs by mouth daily with food as needed for pain. Do not take ibuprofen with this medicine. 30 tablet 0  . nystatin (MYCOSTATIN) powder Apply topically 4 (four) times daily. Under the arm and breast area 15 g 3  . nystatin-triamcinolone ointment (MYCOLOG) Apply topically 2 (two) times daily. 30 g 0  . oxyCODONE-acetaminophen (ROXICET) 5-325 MG per tablet Take 1 tablet by mouth every 8 (eight) hours as needed for pain. 60 tablet 0  . sertraline (ZOLOFT) 50 MG tablet Take 50 mg by mouth daily.  Take 100 mg a day po    . terbinafine  (LAMISIL) 250 MG tablet Take 1 tablet (250 mg total) by mouth daily. 30 tablet 1  . vitamin B-12 (CYANOCOBALAMIN) 1000 MCG tablet Take 1,000 mcg by mouth daily.    Marland Kitchen. dexlansoprazole (DEXILANT) 60 MG capsule Take 1 capsule (60 mg total) by mouth daily. 30 capsule 3  . [DISCONTINUED] pregabalin (LYRICA) 75 MG capsule Take 1 capsule (75 mg total) by mouth 2 (two) times daily. 60 capsule 1   No current facility-administered medications for this visit.    Allergies as of 12/15/2014  . (No Known Allergies)    Vitals: BP 108/70 mmHg  Pulse 78  Resp 20  Ht 5\' 6"  (1.676 m)  Wt 162 lb (73.483 kg)  BMI 26.16 kg/m2 Last Weight:  Wt Readings from Last 1 Encounters:  12/15/14 162 lb (73.483 kg)   ZOX:WRUEBMI:Body mass index is 26.16 kg/(m^2).      Weight ;  165 pounds.   Ht Readings from Last 1 Encounters:  12/15/14 5\' 6"  (1.676 m)    Physical exam:  General: The patient is awake, alert and appears not in acute distress. The patient is well groomed. Head: Normocephalic, atraumatic. Neck is supple. Mallampati 4,  neck circumference: 13 Nasal airflow unrestricited , TMJ click is evident . Retrognathia is seen.  Cardiovascular:  Regular rate and rhythm, without  murmurs or carotid bruit, and without distended neck veins. Respiratory: Lungs are clear to auscultation. Skin:  Without evidence of edema, or rash Trunk: BMI is . The patient's posture is hunched.    Neurologic exam : The patient is awake and alert, oriented to place and time.   Memory subjective described as impaired , but she feels too fatigued to concentrate.  Memory testing revealed - 16 out of 30 points,  Likely invalid due to language barrier. MMSE: Attention span & concentration ability appears normal.  Speech is fluent,  without dysarthria, dysphonia or aphasia. Limited validity  Mood and affect are appropriate.  Cranial nerves: No change in taste or smell. Pupils are equal and briskly  reactive to light. 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 question of pallor or edema. Extraocular movements  in vertical and horizontal planes intact and without nystagmus. Visual fields by finger perimetry are intact. Hearing to finger rub intact.   Facial sensation intact to fine touch.  Facial motor strength is symmetric and tongue and uvula move midline. Shoulder shrug was symmetrical.   Motor exam:  Normal tone, muscle bulk and symmetric strength in all extremities.   No change tendon reflexes: in the  upper and lower extremities are symmetric and intact. Babinski maneuver response is downgoing.  The patient was advised of the nature of the diagnosed sleep disorder , the treatment options and risks for general a health and wellness arising from not treating the condition.  I spent more than 60  minutes of face to face time with the patient.  This was prolonged due to language barrier, memory deficit and translator delay.  Greater than 50% of time was spent in counseling and coordination of care. We have discussed the diagnosis and differential and I answered the patient's questions.     Assessment:  After physical and neurologic examination, review of laboratory studies, 25 minute visit.  Personal review of imaging studies, reports of other /same  Imaging studies ,  Results of polysomnography/ neurophysiology testing and pre-existing records as far as provided in visit., my assessment is   1) the patient is very fatigued which is evident here but I think she is severely deeply depressed. I'm not sure if her memory testing is valid or if this is a pseudodementia.  The inability to follow the instruction otherwise can also be explained by the language .  She has been on Aricept 10 mg 10 mg daily since February and she was placed on doxepin. I think she may be benefiting from a SSRI more and I do wonder if she should be on a twice cyclic antidepressives and at night to allow her sleep such as  Elavil.   2) she has OSA, AHi 12.5 , the agreed on using CPAP. The patient can use an auto titrating at home her daughter will help her. I will order it between 5 and 12 cm water pressure with a small nasal interface or nasal pillow. I explained that she would have to use the machine every night. Medicare considers patients compliant who use the machine for 4 hours or more every night.  3) she also has allergic rhinitis forcing her to breathe through the mouth. Her eyes will burn. Her snoring at times is thunderous. Her daughter described this above. She may benefit best from positive airway therapy addressing snoring and apnea and hopefully also reducing her daytime sleepiness and fatigue. She is allergic to dust, grass, cigarette smoke, laundry detergent or disinfectants.  3)My observation is also in relation to her neck pain the stiffness around the trapezius and deltoid muscles she has very tense trigger points and she has been unable to sleep because of pain from these regions. Possible tension headaches have also to be considered.  4) Headaches are precipitated by allergic reactions , sinusitis, rhinitis and by tension. Sleep deprivation can trigger headaches.  I would like some sleep hygiene rules to be implemented,  it may be beneficial if she cuts out all caffeine from the early afternoon on. I like for her to hydrate was water in the afternoon and evening.   Plan:  Treatment plan and additional workup :  Auto titration on CPAP , 4-12 cm water, fit with small nasal interface.  If MEDICARE allows her to have an attended sleep study, I would prefer this.  RV in 30 days with Butch Penny, NP>  PCP ; Please consider referral to psychiatry / psychology strongly recommended. Depression is severe.  Follow up with Primary neurologist Dr Anne Hahn. Sinus versus  tension headaches and neck and back pain. Elavil trial at night.  Reactive airway disease, allergic sinusitis and Rhinitis.     Kayla Mylar  Garritt Molyneux MD  12/15/2014  Rv in 30-60 days.    CC: Kayla Bender, Md [address] [place], [place] [address]

## 2015-01-07 ENCOUNTER — Ambulatory Visit (INDEPENDENT_AMBULATORY_CARE_PROVIDER_SITE_OTHER): Payer: Medicaid Other | Admitting: Neurology

## 2015-01-07 DIAGNOSIS — J4541 Moderate persistent asthma with (acute) exacerbation: Secondary | ICD-10-CM

## 2015-01-07 DIAGNOSIS — G4733 Obstructive sleep apnea (adult) (pediatric): Secondary | ICD-10-CM

## 2015-01-07 DIAGNOSIS — R0683 Snoring: Secondary | ICD-10-CM

## 2015-01-08 NOTE — Sleep Study (Signed)
Please see the scanned sleep study interpretation located in the procedure tab within the chart review section.   

## 2015-01-19 ENCOUNTER — Telehealth: Payer: Self-pay

## 2015-01-19 DIAGNOSIS — G4733 Obstructive sleep apnea (adult) (pediatric): Secondary | ICD-10-CM

## 2015-01-19 NOTE — Telephone Encounter (Signed)
Again, left a message asking that she call me back. If pt calls back, please advise her that I am out of the office until 12/27 and will return her call that day.

## 2015-01-19 NOTE — Telephone Encounter (Signed)
Left a message asking pt to call me back.  I was going to discuss sleep study results.

## 2015-01-26 NOTE — Telephone Encounter (Signed)
Called and left a message asking pt to please call me back.

## 2015-01-27 NOTE — Telephone Encounter (Signed)
Spoke to pt's daughter [place] ([facility] I advised pt's daughter that Dr. Vickey Hugerohmeier wants to start the pt on a cpap after reviewing pt's sleep study. Pt's daughter is agreeable to pt starting cpap. Pt's daughter translates for the pt, consent for daughter translation signed at last office visit. I advised pt's daughter that I would send the order to a DME and they would call the pt to set up the cpap and explain how to use it. I advised the pt's daughter that the pt needs to use the cpap for at least four hours per night.  Pt's daughter verbalized understanding. [facility] A follow up appt was made for 3/1 at 10:45.

## 2015-03-21 ENCOUNTER — Other Ambulatory Visit: Payer: Self-pay

## 2015-03-21 DIAGNOSIS — Z1231 Encounter for screening mammogram for malignant neoplasm of breast: Secondary | ICD-10-CM

## 2015-03-23 ENCOUNTER — Ambulatory Visit
Admission: RE | Admit: 2015-03-23 | Discharge: 2015-03-23 | Disposition: A | Discharge status: Home / Self Care | Payer: Medicaid Other | Source: Ambulatory Visit

## 2015-03-23 DIAGNOSIS — Z1231 Encounter for screening mammogram for malignant neoplasm of breast: Secondary | ICD-10-CM

## 2015-03-30 ENCOUNTER — Ambulatory Visit: Payer: Self-pay | Admitting: Neurology

## 2015-03-31 ENCOUNTER — Encounter: Payer: Self-pay | Admitting: Neurology

## 2015-03-31 ENCOUNTER — Ambulatory Visit (INDEPENDENT_AMBULATORY_CARE_PROVIDER_SITE_OTHER): Payer: Medicaid Other | Admitting: Neurology

## 2015-03-31 VITALS — BP 124/86 | HR 70 | Resp 20 | Ht 66.0 in | Wt 158.0 lb

## 2015-03-31 DIAGNOSIS — F329 Major depressive disorder, single episode, unspecified: Secondary | ICD-10-CM

## 2015-03-31 DIAGNOSIS — G4733 Obstructive sleep apnea (adult) (pediatric): Secondary | ICD-10-CM | POA: Diagnosis not present

## 2015-03-31 DIAGNOSIS — J302 Other seasonal allergic rhinitis: Secondary | ICD-10-CM

## 2015-03-31 DIAGNOSIS — F0280 Dementia in other diseases classified elsewhere without behavioral disturbance: Secondary | ICD-10-CM

## 2015-03-31 DIAGNOSIS — F0393 Unspecified dementia, unspecified severity, with mood disturbance: Secondary | ICD-10-CM

## 2015-03-31 DIAGNOSIS — Z9989 Dependence on other enabling machines and devices: Principal | ICD-10-CM

## 2015-03-31 NOTE — Progress Notes (Signed)
SLEEP MEDICINE CLINIC   Provider:  Melvyn Novas, M D  Referring Provider: Gwenyth Bender, MD  Primary Care Physician:  Gwenyth Bender, MD  Lesia Sago, MD primary neurologist .   No chief complaint on file.  Chief complaint according to patient : "According to the daughter the patient has a low energy feels tired, is fatigued and sleepy, she also lost her appetite." The daughter translates the visit. Depression is present.   HPI:  Kayla Davies is a 60 y.o. female, [place], seen here in the presence of her daughter as a referral  from Dr. August Saucer for a sleep evaluation,  Kayla Davies has been established with another provider in the office, Dr. Lesia Sago who saw her for lumbago 2 years ago. She has been on Dexilant, Doxepin at night and vitamin D supplement  She has a reported medical history of allergic rhinitis,  Asthma , GERD, memory loss- diagnosed 2 years ago as Alzheimer's dementia, foraminal stenosis of the lumbosacral region, joint pain affecting knee and hip and lower back vitamin D deficiency, headaches. The patient according to her daughter snores at night and has likely a history of sleep apnea , but she has not had a recent evaluation. She acknowledges that she feels depressed. This may also have caused her appetite or change.  Sleep habits are as follows: The patient states that she has variable bedtimes she believes that she mostly goes to bed around 9 PM, the patient states that she does not fall immediately asleep but often headaches bother her from going to sleep. In some extreme circumstances she may stay up until 2 AM while in bed waiting to fall asleep. She does not watch TV in bed and her bedroom is cool, quiet and dark. She sleeps alone in the same room. Even if she struggles for a long time to fall asleep she cannot necessarily stay asleep throughout the night and she usually wakes up 2 or 3 times age time was trouble to go back to sleep. He reports  that she has to go to the bathroom when she wakes up. She usually wakes up around 6 AM spontaneously without any alarm and she will remain awake until 11 or 11:30. Then she gets tired again and may nap in daytime. When she wakes up at 6 AM she does not feel refreshed or restored. She endorsed that her joints hurt , her neck is stiff , her shoulders hurt and she has headaches. In daytime she may try to nap 3 or 4 times but she feels that she is always just drowsy but not actually entering sleep. Her nocturnal sleep is marked by loud snoring but the daughter has heard. Is also irregular breathing. The daughter said that it is extremely loud and that she can hear it in another room across the hallway.  Reviewed the patient's medication list. An attempt to perform a Montral cognitive assessment test was made but not completed I do not see an attempt to do a Mini-Mental Status evaluation which is usually the alternative. The geriatric depression score was endorsed at 5 points the Epworth sleepiness score at 12 points fatigue severity score at 58 points.  Sleep medical history and family sleep history: grandparents , maternal snored.  Social history: lives in [place] for 7 years , from [place]. Lives with daughter and 4 grand children.  No caffeine intake.    12-15-14 Kayla Davies is seen here today on 11-16 16  to follow-up on her recent sleep study performed on 11-12-14. She had been referred by Dr. August Saucer. Her AHI was 13.8 and she slept all night on her side or prone. There was no accentuation with position noted. She slept for about 72% of the recorded time and had a long latency to dream sleep. REM sleep was first seen after 209 minutes of sleep. Oxygen desaturation was noted but was not maintained for a long time the nadir was 67% was 24.6 minutes of desaturation time total. There were no significant leg movements most arousals were spontaneous. Her daughter is most concerned that whatever device will be used in  the treatment also addresses her mother's snoring. Snoring is "thunderous"and can be heard 3 rooms away at night.   03-31-15 Kayla Davies is here today again she underwent a polysomnogram the on 11/12/2014 revealing a mild apnea hypotony index 13.8. It was not accentuated during REM sleep and there was no supine sleep seen. She did have some oxygen desaturation for 24 minutes at night the nadir was 67%. Because the patient was so excessively daytime sleepy she return for CPAP titration. At 6 cm water the AHI was 0.9 and the oxygen nadir raised to 92%. She also was able to enter REM sleep at 49 minutes. She was set to an air-fitP 10 mask and extra small size a so-called nasal pillow. The patient developed some pressure marks from her nasal mask she also has a full face mask at home. She showed a pressure marks on the nose bridge. I would like for her to try the air-fit P 10, she begun using this new mask on February 10 and for the last 20 days had an compliance of 93% and over 90% 4 hours of daily use over 4 hours. Average user time is 6 hours 39 minutes since February 10. A residual AHI is 1.6 which is excellent she is using 6.6 cm water was 1 cm EPR. She had only one night with a significant air leak on Sunday, February 19th.   Review of Systems: Out of a complete 14 system review, the patient complains of only the following symptoms, and all other reviewed systems are negative. Snoring, sleepiness, fatigue , depression. Fragmented sleep.     Social History   Social History  . Marital Status: Single    Spouse Name: N/A  . Number of Children: 3  . Years of Education: 0   Occupational History  . unemployed    Social History Main Topics  . Smoking status: Never Smoker   . Smokeless tobacco: Never Used  . Alcohol Use: No  . Drug Use: No  . Sexual Activity: No   Other Topics Concern  . Not on file   Social History Narrative   Lives at home with daughter    No family history on  file.  Past Medical History  Diagnosis Date  . Obstructive sleep apnea (adult) (pediatric)   . Unspecified vitamin D deficiency   . Edema   . Other malaise and fatigue   . Allergic rhinitis, cause unspecified   . Unspecified asthma(493.90)   . Arthropathy, unspecified, other specified sites   . Scoliosis (and kyphoscoliosis), idiopathic   . 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 Esophageal reflux   . Asymptomatic varicose veins   . Lumbago   . Pain in joint, shoulder region   . Cervicalgia   . Bronchitis   . Reactive airway disease   .  Unspecified disorder of lipoid metabolism   . Vertigo   . Radiculopathy of lumbar region   . Bladder incontinence   . Bowel incontinence     Past Surgical History  Procedure Laterality Date  . Neck surgery      Current Outpatient Prescriptions  Medication Sig Dispense Refill  . acetaminophen (TYLENOL) 325 MG tablet Take 2 tablets (650 mg total) by mouth every 6 (six) hours as needed for pain (joint pain). 60 tablet 0  . albuterol (PROVENTIL HFA) 108 (90 BASE) MCG/ACT inhaler Inhale 2 puffs into the lungs every 4 (four) hours as needed. For cough or SOB    . amitriptyline (ELAVIL) 25 MG tablet Take 1 tablet (25 mg total) by mouth at bedtime as needed for sleep. 30 tablet 3  . antipyrine-benzocaine (AURALGAN) otic solution Place 3 drops into the right ear every 2 (two) hours as needed for pain. 10 mL 0  . cetirizine (ZYRTEC) 10 MG tablet Take 1 tablet (10 mg total) by mouth daily as needed for allergies. 30 tablet 0  . clotrimazole-betamethasone (LOTRISONE) cream APP AA AND SURROUNDING AREAS BID IN THE MORNING AND EVENING FOR 2 WEEKS  2  . cyclobenzaprine (FLEXERIL) 10 MG tablet Take 1 tablet (10 mg total) by mouth 2 (two) times daily as needed for muscle spasms. 60 tablet 0  . DEXILANT 60 MG capsule TK 1 C PO QD  3  . dexlansoprazole (DEXILANT) 60 MG capsule Take 1 capsule (60 mg total) by mouth daily. 30  capsule 3  . diazepam (VALIUM) 5 MG tablet Take 1 tablet (5 mg total) by mouth 2 (two) times daily. 10 tablet 0  . fluticasone (CUTIVATE) 0.05 % cream Apply topically 2 (two) times daily. 30 g 0  . gabapentin (NEURONTIN) 300 MG capsule Take 1 capsule (300 mg total) by mouth 3 (three) times daily. 90 capsule 3  . ibuprofen (ADVIL,MOTRIN) 400 MG tablet Take 1 tablet (400 mg total) by mouth every 6 (six) hours as needed for pain. 30 tablet 0  . meloxicam (MOBIC) 7.5 MG tablet 1-2 tabs by mouth daily with food as needed for pain. Do not take ibuprofen with this medicine. 30 tablet 0  . nystatin (MYCOSTATIN) powder Apply topically 4 (four) times daily. Under the arm and breast area 15 g 3  . nystatin-triamcinolone ointment (MYCOLOG) Apply topically 2 (two) times daily. 30 g 0  . sertraline (ZOLOFT) 50 MG tablet Take 50 mg by mouth daily.  Take 100 mg a day po    . terbinafine (LAMISIL) 250 MG tablet Take 1 tablet (250 mg total) by mouth daily. 30 tablet 1  . vitamin B-12 (CYANOCOBALAMIN) 1000 MCG tablet Take 1,000 mcg by mouth daily.    . [DISCONTINUED] pregabalin (LYRICA) 75 MG capsule Take 1 capsule (75 mg total) by mouth 2 (two) times daily. 60 capsule 1   No current facility-administered medications for this visit.    Allergies as of 03/31/2015  . (No Known Allergies)    Vitals: There were no vitals taken for this visit. Last Weight:  Wt Readings from Last 1 Encounters:  12/15/14 162 lb (73.483 kg)   BJY:NWGNF is no weight on file to calculate BMI.      Weight ;  165 pounds.   Ht Readings from Last 1 Encounters:  12/15/14 5\' 6"  (1.676 m)    Physical exam:  General: The patient is awake, alert and appears not in acute distress. The patient is well groomed. Head: Normocephalic, atraumatic. Neck  is supple. Mallampati 4,  neck circumference: 13 Nasal airflow unrestricited , TMJ click is evident . Retrognathia is seen.  Cardiovascular:  Regular rate and rhythm, without  murmurs or  carotid bruit, and without distended neck veins. Respiratory: Lungs are clear to auscultation. Skin:  Without evidence of edema, or rash Trunk: BMI is . The patient's posture is hunched.    Neurologic exam : The patient is awake and alert, oriented to place and time.   Memory subjective described as impaired , but she feels too fatigued to concentrate.  Memory testing revealed - 16 out of 30 points,  Likely invalid due to language barrier. MMSE: Attention span & concentration ability appears normal.  Speech is fluent,  without dysarthria, dysphonia or aphasia. Limited validity  Mood and affect are appropriate.  Cranial nerves: No change in taste or smell. Pupils are equal and briskly reactive to light. 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 question of pallor or edema. Extraocular movements  in vertical and horizontal planes intact and without nystagmus. Visual fields by finger perimetry are intact. Hearing to finger rub intact.   Facial sensation intact to fine touch.  Facial motor strength is symmetric and tongue and uvula move midline. Shoulder shrug was symmetrical.    The patient was advised of the nature of the diagnosed sleep disorder , the treatment options and risks for general a health and wellness arising from not treating the condition.  I spent more than 20  minutes of face to face time with the patient, explaining the compliance needs for insurance.  She has done well since 10th of February -   This was prolonged due to language barrier, memory deficit and translator delay. Greater than 50% of time was spent in counseling and coordination of care. We have discussed the diagnosis and differential and I answered the patient's questions.     Assessment:  After physical and neurologic examination, review of laboratory studies, 25 minute visit.  Personal review of imaging studies, reports of other /same  Imaging studies ,  Results of  polysomnography/ neurophysiology testing and pre-existing records as far as provided in visit., my assessment is   1) the patient is very fatigued which is evident here but I think she is severely deeply depressed. I'm not sure if her memory testing is valid or if this is a pseudodementia.  The inability to follow the instruction otherwise can also be explained by the language . Her sleepiness and fatigue improved under CPAP use. Epworth 16,     2)She has been on Aricept 10 mg 10 mg daily since February and she was placed on doxepin. I think she may be benefiting from a SSRI more and I do wonder if she should be on a twice cyclic antidepressives and at night to allow her sleep such as Elavil.   3) she has OSA, AHi 13.8 , the agreed on using CPAP. The patient can use CPAP now at 6.5 vcm water  Which is well tolerated- I will ask her to return in 9 month. Her mouth is parched, i will introduce a higher level of humidity.  She is allergic to dust, grass, cigarette smoke, laundry detergent or disinfectants.    Plan:  Treatment plan and additional workup :  RV in 9 month  with Butch Penny, NP>  PCP ; Please consider referral to psychiatry / psychology strongly recommended.  Depression is severe.  OSA successfully controlled on low  pressure PAP, 6.6 cm water.  Follow up with Primary neurologist Dr Anne Hahn for Sinus versus tension headaches and neck and back pain. Elavil trial at night.  Reactive airway disease, allergic sinusitis and Rhinitis.     Porfirio Mylar Anya Murphey MD  03/31/2015   CC: Gwenyth Bender, Md [address] [place], [place] [address]

## 2015-03-31 NOTE — Patient Instructions (Signed)

## 2015-08-25 ENCOUNTER — Telehealth: Payer: Self-pay | Admitting: Neurology

## 2015-08-25 NOTE — Telephone Encounter (Signed)
I faxed the CMN back to Kayla Davies on 08/23/15. I left a message on [name]'s VM advising her of this. IF she needs me to fax it again, she needs to leave me a good fax number.

## 2015-08-25 NOTE — Telephone Encounter (Signed)
[name] [phone] ext [phone] called to check status of order that was faxed to our office on July 21st for CPAP machine re-certification.

## 2015-10-17 ENCOUNTER — Encounter: Payer: Self-pay | Admitting: Neurology

## 2015-12-01 ENCOUNTER — Ambulatory Visit: Payer: Medicaid Other | Admitting: Neurology

## 2015-12-20 ENCOUNTER — Encounter: Payer: Self-pay | Admitting: Neurology

## 2015-12-20 ENCOUNTER — Ambulatory Visit (INDEPENDENT_AMBULATORY_CARE_PROVIDER_SITE_OTHER): Payer: Medicaid Other | Admitting: Neurology

## 2015-12-20 VITALS — BP 137/84 | HR 84 | Resp 20 | Ht 66.0 in | Wt 159.0 lb

## 2015-12-20 DIAGNOSIS — F321 Major depressive disorder, single episode, moderate: Secondary | ICD-10-CM | POA: Diagnosis not present

## 2015-12-20 DIAGNOSIS — G4730 Sleep apnea, unspecified: Secondary | ICD-10-CM

## 2015-12-20 DIAGNOSIS — G4710 Hypersomnia, unspecified: Secondary | ICD-10-CM

## 2015-12-20 DIAGNOSIS — Z9989 Dependence on other enabling machines and devices: Secondary | ICD-10-CM

## 2015-12-20 DIAGNOSIS — G4733 Obstructive sleep apnea (adult) (pediatric): Secondary | ICD-10-CM

## 2015-12-20 NOTE — Patient Instructions (Signed)
Persistent Depressive Disorder Persistent depressive disorder (PDD) is a mental health condition. PDD causes symptoms of low-level depression for 2 years or longer. It may also be called long-term (chronic) depression or dysthymia. PDD may include episodes of more severe depression that last for about 2 weeks (major depressive disorder or MDD). PDD can affect the way you think, feel, and sleep. This condition may also affect your relationships. You may be more likely to get sick if you have PDD. Symptoms of PDD occur for most of the day and may include:  Feeling tired (fatigue).  Low energy.  Eating too much or too little.  Sleeping too much or too little.  Feeling restless or agitated.  Feeling hopeless.  Feeling worthless or guilty.  Feeling worried or nervous (anxiety).  Trouble concentrating or making decisions.  Low self-esteem.  A negative way of looking at things (outlook).  Not being able to have fun or feel pleasure.  Avoiding interacting with people.  Getting angry or annoyed easily (irritability).  Acting aggressive or angry.  Follow these instructions at home: Activity  Go back to your normal activities as told by your doctor.  Exercise regularly as told by your doctor. General instructions  Take over-the-counter and prescription medicines only as told by your doctor.  Do not drink alcohol. Or, limit how much alcohol you drink to no more than 1 drink a day for nonpregnant women and 2 drinks a day for men. One drink equals 12 oz of beer, 5 oz of wine, or 1 oz of hard liquor. Alcohol can affect any antidepressant medicines you are taking. Talk with your doctor about your alcohol use.  Eat a healthy diet and get plenty of sleep.  Find activities that you enjoy each day.  Consider joining a support group. Your doctor may be able to suggest a support group.  Keep all follow-up visits as told by your doctor. This is important. Where to find more  information: National Alliance on Mental Illness  www.nami.org  U.S. National Institute of Mental Health  www.nimh.nih.gov  National Suicide Prevention Lifeline  1-800-273-TALK (1-800-273-8255). This is free, 24-hour help.  Contact a doctor if:  Your symptoms get worse.  You have new symptoms.  You have trouble sleeping or doing your daily activities. Get help right away if:  You self-harm.  You have serious thoughts about hurting yourself or others.  You see, hear, taste, smell, or feel things that are not there (hallucinate). This information is not intended to replace advice given to you by your health care provider. Make sure you discuss any questions you have with your health care provider. Document Released: 12/27/2014 Document Revised: 09/09/2015 Document Reviewed: 09/09/2015 Elsevier Interactive Patient Education  2017 [employer]  

## 2015-12-20 NOTE — Progress Notes (Signed)
SLEEP MEDICINE CLINIC   Provider:  Melvyn Novasarmen  Noeh Sparacino, M D  Referring Provider: Gwenyth Benderean, Eric L, MD  Primary Care Physician:  Gwenyth BenderEric L Dean, MD  Lesia SagoKeith Willis, MD primary neurologist .   Chief Complaint  Patient presents with  . Follow-up    cpap, with interpreter   Chief complaint according to patient : "According to the daughter the patient has a low energy feels tired, is fatigued and sleepy, she also lost her appetite." The daughter translates the visit. Depression is present.   HPI:  Kayla Davies is a 60 y.o. female, [place], seen here in the presence of her daughter as a referral  from Dr. August Saucerean for a sleep evaluation,  Kayla Davies has been established with another provider in the office, Dr. Lesia SagoKeith Willis who saw her for lumbago 2 years ago. She has been on Dexilant, Doxepin at night and vitamin D supplement She has a reported medical history of allergic rhinitis,  Asthma , GERD, memory loss- diagnosed 2 years ago as Alzheimer's dementia, foraminal stenosis of the lumbosacral region, joint pain affecting knee and hip and lower back vitamin D deficiency, headaches. The patient according to her daughter snores at night and has likely a history of sleep apnea , but she has not had a recent evaluation. She acknowledges that she feels depressed. This may also have caused her appetite or change.  Sleep habits are as follows: The patient states that she has variable bedtimes she believes that she mostly goes to bed around 9 PM, the patient states that she does not fall immediately asleep but often headaches bother her from going to sleep. In some extreme circumstances she may stay up until 2 AM while in bed waiting to fall asleep. She does not watch TV in bed and her bedroom is cool, quiet and dark. She sleeps alone in the same room. Even if she struggles for a long time to fall asleep she cannot necessarily stay asleep throughout the night and she usually wakes up 2 or 3 times age  time was trouble to go back to sleep. He reports that she has to go to the bathroom when she wakes up. She usually wakes up around 6 AM spontaneously without any alarm and she will remain awake until 11 or 11:30. Then she gets tired again and may nap in daytime. When she wakes up at 6 AM, she does not feel refreshed or restored. She endorsed that her joints hurt , her neck is stiff , her shoulders hurt and she has headaches. In daytime she may try to nap 3 or 4 times but she feels that she is always just drowsy but not actually entering sleep.  Her nocturnal sleep is marked by loud snoring but the daughter has heard. Is also irregular breathing. The daughter said that it is extremely loud and that she can hear it in another room across the hallway.  Reviewed the patient's medication list. An attempt to perform a Montral cognitive assessment test was made but not completed I do not see an attempt to do a Mini-Mental Status evaluation which is usually the alternative. The geriatric depression score was endorsed at 5 points the Epworth sleepiness score at 12 points fatigue severity score at 58 points.  Sleep medical history and family sleep history: grandparents , maternal snored.  Social history: lives in [place] for 7 years , from [name]. Lives with daughter and 4 grand children.  No caffeine intake.  12-15-14 Kayla Davies is seen here today on 11-16- 16 to follow-up on her recent sleep study performed on 11-12-14. She had been referred by Dr. August Saucer. Her AHI was 13.8 and she slept all night on her side or prone. There was no accentuation with position noted. She slept for about 72% of the recorded time and had a long latency to dream sleep. REM sleep was first seen after 209 minutes of sleep. Oxygen desaturation was noted but was not maintained for a long time the nadir was 67% was 24.6 minutes of desaturation time total. There were no significant leg movements most arousals were spontaneous. Her daughter is  most concerned that whatever device will be used in the treatment also addresses her mother's snoring. Snoring is "thunderous"and can be heard 3 rooms away at night.   03-31-15 Kayla Davies is here today again she underwent a polysomnogram the on 11/12/2014 revealing a mild apnea hypotony index 13.8. It was not accentuated during REM sleep and there was no supine sleep seen. She did have some oxygen desaturation for 24 minutes at night the nadir was 67%. Because the patient was so excessively daytime sleepy she return for CPAP titration. At 6 cm water the AHI was 0.9 and the oxygen nadir raised to 92%. She also was able to enter REM sleep at 49 minutes. She was set to an air-fitP 10 mask and extra small size a so-called nasal pillow. The patient developed some pressure marks from her nasal mask she also has a full face mask at home. She showed a pressure marks on the nose bridge. I would like for her to try the air-fit P 10, she begun using this new mask on February 10 and for the last 20 days had an compliance of 93% and over 90% 4 hours of daily use over 4 hours. Average user time is 6 hours 39 minutes since February 10. A residual AHI is 1.6 which is excellent she is using 6.6 cm water was 1 cm EPR. She had only one night with a significant air leak on Sunday, February 19th.  History from 12/20/2015, Kayla Davies is here in the presence of her son and daughter for a follow-up visit she has been 100% compliant CPAP user average time of CPAP use is 6 hours and 4 minutes each night CPAP is set at 6.5 cm pressure was 1 cm EPR. She does have significant air leakage but her residual apnea index is low. Her AHI is 1.2. She is alert and better rested.    Review of Systems: Out of a complete 14 system review, the patient complains of only the following symptoms, and all other reviewed systems are negative. Snoring, sleepiness, fatigue improved  , but there is still a lot of depression. Fragmented sleep has  improved. Epworth 14, 37 on FSS/  dry  tongue in AM, needs to adjust humidifier settings.      Social History   Social History  . Marital status: Single    Spouse name: N/A  . Number of children: 3  . Years of education: 0   Occupational History  . unemployed    Social History Main Topics  . Smoking status: Never Smoker  . Smokeless tobacco: Never Used  . Alcohol use No  . Drug use: No  . Sexual activity: No   Other Topics Concern  . Not on file   Social History Narrative   Lives at home with daughter  Shari Heritage works as a Nurse, learning disability.  No family history on file.  Past Medical History:  Diagnosis Date  . Allergic rhinitis, cause unspecified   . Arthropathy, unspecified, other specified sites   . Asymptomatic varicose veins   . Bladder incontinence   . Bowel incontinence   . Bronchitis   . Cervicalgia   . Edema   . Esophageal reflux   . Lumbago   . Obstructive sleep apnea (adult) (pediatric)   . Other malaise and fatigue   . Pain in joint, shoulder region   . Radiculopathy of lumbar region   . Reactive airway disease   . Scoliosis (and kyphoscoliosis), idiopathic   . 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 Unspecified asthma(493.90)   . Unspecified disorder of lipoid metabolism   . Unspecified vitamin D deficiency   . Vertigo     Past Surgical History:  Procedure Laterality Date  . NECK SURGERY      Current Outpatient Prescriptions  Medication Sig Dispense Refill  . acetaminophen (TYLENOL) 325 MG tablet Take 2 tablets (650 mg total) by mouth every 6 (six) hours as needed for pain (joint pain). 60 tablet 0  . albuterol (PROVENTIL HFA) 108 (90 BASE) MCG/ACT inhaler Inhale 2 puffs into the lungs every 4 (four) hours as needed. For cough or SOB    . amitriptyline (ELAVIL) 25 MG tablet Take 1 tablet (25 mg total) by mouth at bedtime as needed for sleep. 30 tablet 3  . antipyrine-benzocaine (AURALGAN) otic solution Place 3 drops into the right ear every 2 (two) hours as  needed for pain. 10 mL 0  . cetirizine (ZYRTEC) 10 MG tablet Take 1 tablet (10 mg total) by mouth daily as needed for allergies. 30 tablet 0  . clotrimazole-betamethasone (LOTRISONE) cream APP AA AND SURROUNDING AREAS BID IN THE MORNING AND EVENING FOR 2 WEEKS  2  . cyclobenzaprine (FLEXERIL) 10 MG tablet Take 1 tablet (10 mg total) by mouth 2 (two) times daily as needed for muscle spasms. 60 tablet 0  . DEXILANT 60 MG capsule TK 1 C PO QD  3  . diazepam (VALIUM) 5 MG tablet Take 1 tablet (5 mg total) by mouth 2 (two) times daily. 10 tablet 0  . fluticasone (CUTIVATE) 0.05 % cream Apply topically 2 (two) times daily. 30 g 0  . gabapentin (NEURONTIN) 300 MG capsule Take 1 capsule (300 mg total) by mouth 3 (three) times daily. 90 capsule 3  . ibuprofen (ADVIL,MOTRIN) 400 MG tablet Take 1 tablet (400 mg total) by mouth every 6 (six) hours as needed for pain. 30 tablet 0  . meloxicam (MOBIC) 7.5 MG tablet 1-2 tabs by mouth daily with food as needed for pain. Do not take ibuprofen with this medicine. 30 tablet 0  . nystatin (MYCOSTATIN) powder Apply topically 4 (four) times daily. Under the arm and breast area 15 g 3  . nystatin-triamcinolone ointment (MYCOLOG) Apply topically 2 (two) times daily. 30 g 0  . sertraline (ZOLOFT) 50 MG tablet Take 50 mg by mouth daily.  Take 100 mg a day po    . terbinafine (LAMISIL) 250 MG tablet Take 1 tablet (250 mg total) by mouth daily. 30 tablet 1  . vitamin B-12 (CYANOCOBALAMIN) 1000 MCG tablet Take 1,000 mcg by mouth daily.    Marland Kitchen dexlansoprazole (DEXILANT) 60 MG capsule Take 1 capsule (60 mg total) by mouth daily. 30 capsule 3   No current facility-administered medications for this visit.     Allergies as of 12/20/2015  . (No Known Allergies)  Vitals: BP 137/84   Pulse 84   Resp 20   Ht 5\' 6"  (1.676 m)   Wt 159 lb (72.1 kg)   BMI 25.66 kg/m  Last Weight:  Wt Readings from Last 1 Encounters:  12/20/15 159 lb (72.1 kg)   WUJ:WJXBBMI:Body mass index is 25.66  kg/m.      Weight ;  165 pounds.   Ht Readings from Last 1 Encounters:  12/20/15 5\' 6"  (1.676 m)    Physical exam:  General: The patient is awake, alert and appears not in acute distress. The patient is well groomed. Head: Normocephalic, atraumatic. Neck is supple. Mallampati 4,  neck circumference: 13 Nasal airflow unrestricited , TMJ click is evident . Retrognathia is seen.  Cardiovascular:  Regular rate and rhythm, without  murmurs or carotid bruit, and without distended neck veins. Respiratory: Lungs are clear to auscultation. Skin:  Without evidence of edema, or rash Trunk: BMI is . The patient's posture is hunched.    Neurologic exam : The patient is awake and alert, oriented to place and time.   Memory subjective described as impaired , but she feels too fatigued to concentrate.  Memory testing revealed - 16 out of 30 points,  Likely invalid due to language barrier.  MMSE: Attention span & concentration ability appears normal.  Speech is fluent,  without dysarthria, dysphonia or aphasia. Limited validity  Mood and affect are appropriate, she appears less depressed, less hopeless than in previous visits.  Cranial nerves: No change in taste or smell. Pupils are equal and briskly reactive to light.  Extraocular movements  in vertical and horizontal planes intact and without nystagmus. Visual fields by finger perimetry are intact. Hearing to finger rub intact. Facial sensation intact to fine touch.Facial motor strength is symmetric and tongue and uvula move midline. Shoulder shrug was symmetrical.   The patient was advised of the nature of the diagnosed sleep disorder , the treatment options and risks for general a health and wellness arising from not treating the condition.  I spent more than 20  minutes of face to face time with the patient, explaining the compliance needs for insurance.  Her translator helped with the  Information.  She has done well since 10th of February -    This was prolonged due to language barrier, memory deficit and translator delay. Greater than 50% of time was spent in counseling and coordination of care. We have discussed the diagnosis and differential and I answered the patient's questions.     Assessment:  After physical and neurologic examination, review of laboratory studies, 25 minute visit.  Personal review of imaging studies, reports of other /same  Imaging studies ,  Results of polysomnography/ neurophysiology testing and pre-existing records as far as provided in visit., my assessment is   1) the patient is very fatigued which is evident here but I think she is severely deeply depressed. I'm not sure if her memory testing is valid or if this is a pseudodementia.  The inability to follow the instruction otherwise can also be explained by the language . Her sleepiness and fatigue improved under CPAP use. Epworth 14 on CPAP.      2)She has been on Aricept 10 mg 10 mg daily since February and she was placed on doxepin, too.  I think she may be benefiting from a SSRI more . Aricept is cholinergic, and Elavil/ Doxepin is anticholinergic- not a good mix. Doxepin prescribed by dr August Saucerean.  She will d/c aricept and  return for MMSE in 9 month with NP.   3) she has OSA, AHi 13.8 , the agreed on using CPAP. The patient can use CPAP now at 6.5 cm water pressure- needs to adjust humidification.  Which is well tolerated- I will ask her to return in 9 month. Her mouth is parched, I will introduce a higher level of humidity.  She is allergic to dust, grass, cigarette smoke, laundry detergent or disinfectants.    Plan:  Treatment plan and additional workup :  RV in 9 month  with Butch Penny, NP> Add MMSE to that visit.  Depression is still present , family considers it severe.  OSA successfully controlled on low pressure PAP, 6.5cm water.     Kayla Mylar Chynah Orihuela MD  12/20/2015   CC: Gwenyth Bender, Md [address] [place], [place]  [address]

## 2016-03-13 ENCOUNTER — Other Ambulatory Visit: Payer: Self-pay | Admitting: Internal Medicine

## 2016-03-13 DIAGNOSIS — Z1231 Encounter for screening mammogram for malignant neoplasm of breast: Secondary | ICD-10-CM

## 2016-04-03 ENCOUNTER — Ambulatory Visit
Admission: RE | Admit: 2016-04-03 | Discharge: 2016-04-03 | Disposition: A | Discharge status: Home / Self Care | Payer: Medicaid Other | Source: Ambulatory Visit | Attending: Internal Medicine | Admitting: Internal Medicine

## 2016-04-03 DIAGNOSIS — Z1231 Encounter for screening mammogram for malignant neoplasm of breast: Secondary | ICD-10-CM

## 2016-04-04 ENCOUNTER — Ambulatory Visit: Payer: Medicaid Other

## 2016-06-19 ENCOUNTER — Institutional Professional Consult (permissible substitution): Payer: Medicaid Other | Admitting: Pulmonary Disease

## 2016-06-29 ENCOUNTER — Institutional Professional Consult (permissible substitution): Payer: Medicaid Other | Admitting: Pulmonary Disease

## 2016-08-21 ENCOUNTER — Ambulatory Visit: Payer: Medicaid Other | Admitting: Adult Health

## 2016-10-16 ENCOUNTER — Institutional Professional Consult (permissible substitution): Payer: Medicaid Other | Admitting: Pulmonary Disease

## 2016-10-19 ENCOUNTER — Ambulatory Visit (INDEPENDENT_AMBULATORY_CARE_PROVIDER_SITE_OTHER): Payer: Medicaid Other | Admitting: Pulmonary Disease

## 2016-10-19 ENCOUNTER — Other Ambulatory Visit (INDEPENDENT_AMBULATORY_CARE_PROVIDER_SITE_OTHER): Payer: Medicaid Other

## 2016-10-19 ENCOUNTER — Encounter: Payer: Self-pay | Admitting: Pulmonary Disease

## 2016-10-19 ENCOUNTER — Ambulatory Visit (INDEPENDENT_AMBULATORY_CARE_PROVIDER_SITE_OTHER)
Admission: RE | Admit: 2016-10-19 | Discharge: 2016-10-19 | Disposition: A | Discharge status: Home / Self Care | Payer: Medicaid Other | Source: Ambulatory Visit | Attending: Pulmonary Disease | Admitting: Pulmonary Disease

## 2016-10-19 DIAGNOSIS — R05 Cough: Secondary | ICD-10-CM | POA: Diagnosis not present

## 2016-10-19 DIAGNOSIS — R053 Chronic cough: Secondary | ICD-10-CM

## 2016-10-19 LAB — CBC
HEMATOCRIT: 40.3 % (ref 36.0–46.0)
HEMOGLOBIN: 13.4 g/dL (ref 12.0–15.0)
MCHC: 33.2 g/dL (ref 30.0–36.0)
MCV: 89 fl (ref 78.0–100.0)
Platelets: 255 10*3/uL (ref 150.0–400.0)
RBC: 4.53 Mil/uL (ref 3.87–5.11)
RDW: 13.5 % (ref 11.5–15.5)
WBC: 3.9 10*3/uL — ABNORMAL LOW (ref 4.0–10.5)

## 2016-10-19 LAB — BASIC METABOLIC PANEL
BUN: 14 mg/dL (ref 6–23)
CALCIUM: 9.9 mg/dL (ref 8.4–10.5)
CO2: 31 mEq/L (ref 19–32)
CREATININE: 0.66 mg/dL (ref 0.40–1.20)
Chloride: 101 mEq/L (ref 96–112)
GFR: 96.07 mL/min (ref >60.00)
Glucose, Bld: 85 mg/dL (ref 70–99)
Potassium: 3.8 mEq/L (ref 3.5–5.1)
SODIUM: 138 meq/L (ref 135–145)

## 2016-10-19 NOTE — Patient Instructions (Signed)
Blood work and chest x-ray today  Use Delsym 5 mL thrice daily for cough as needed including at bedtime  Benzonatate 200 mg thrice daily as needed  Trial of chlorpheniramine 4 mg daily with store brand Sudafed (walphed) once daily for 3 weeks to treat for sinus drip  Stay on Dexilant for reflux

## 2016-10-19 NOTE — Assessment & Plan Note (Addendum)
Differential diagnosis here could be benign condition such as postnasal drip and reflux given that this has been going on for 5 years. Will obtain CBC for eosinophilia, CMET to check LFTs and RAST panel for allergies With her history of night sweats, cold tuberculosis needs to be ruled out. She reports negative PPD but positive as noted on problem list. chest x-ray today  Use Delsym 5 mL thrice daily for cough as needed including at bedtime  Benzonatate 200 mg thrice daily as needed  Trial of chlorpheniramine 4 mg daily with store brand Sudafed (walphed) once daily for 3 weeks to treat for sinus drip  Stay on Dexilant for reflux

## 2016-10-19 NOTE — Progress Notes (Signed)
Subjective:    Patient ID: Kayla Davies, female    DOB: 03-01-1955, 61 y.o.   MRN: [id number]  HPI  61 year old never smoker, originally from [place] , presents for evaluation of chronic cough for 5 years. She is accompanied by interpreter and her daughter [place] who provided the history Seen in 2012 by my partner Dr. Delford Field for ' Upper airway instability d/t severe GERD and cyclical cough.  Doubt true asthma  Severe chest pain syndrome and associated hyperemesis and Dysphagia '  She reports chronic cough ongoing for 5-6 years, worse in the winter and is somewhat better in the summer although daughter reports that she is still coughing when he had conditioning is on. Cough is occasionally productive of yellow mucus. She has had 2-3 episodes of blood-tinged sputum last such episode was 6 months ago and EGD was performed She reports occasional night sweats. She reports a 20 pound weight loss and I note that her weight on her last visit in 2012 was 164 and has dropped to 145 pounds. Coughing is especially worse at night and she is unable to sleep on her back for the last 5 years. She's been seen by GI, upper endoscopy about 6 months ago showed gastritis and no further gastric polyps were noted. She's been maintained on DExilant for reflux esophagitis. She has been given albuterol which does not seem to help her cough. She has paroxysms of cough which makes her breathing worse but in between these paroxysms she denies significant shortness of breath. There is no history of orthopnea, paroxysmal nocturnal dyspnea or pedal edema. She has tried over-the-counter cough syrup without much relief  She denies significant sinus drip or seasonal allergies She emigrated about 10 years ago and denies prior history of tuberculosis or sick contacts  Past Medical History:  Diagnosis Date  . Allergic rhinitis, cause unspecified   . Arthropathy, unspecified, other specified sites   . Asymptomatic varicose veins     . Bladder incontinence   . Bowel incontinence   . Bronchitis   . Cervicalgia   . Edema   . Esophageal reflux   . Lumbago   . Obstructive sleep apnea (adult) (pediatric)   . Other malaise and fatigue   . Pain in joint, shoulder region   . Radiculopathy of lumbar region   . Reactive airway disease   . Scoliosis (and kyphoscoliosis), idiopathic   . 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 Unspecified asthma(493.90)   . Unspecified disorder of lipoid metabolism   . Unspecified vitamin D deficiency   . Vertigo        Past Surgical History:  Procedure Laterality Date  . NECK SURGERY    ''  No Known Allergies    Social History   Social History  . Marital status: Single    Spouse name: N/A  . Number of children: 3  . Years of education: 0   Occupational History  . unemployed    Social History Main Topics  . Smoking status: Never Smoker  . Smokeless tobacco: Never Used  . Alcohol use No  . Drug use: No  . Sexual activity: No   Other Topics Concern  . Not on file   Social History Narrative   Lives at home with daughter      No family history on file.   Review of Systems Constitutional: negative for anorexia, fevers and sweats  Eyes: negative for irritation, redness and visual disturbance  Ears, nose, mouth, throat, and face: negative  for earaches, epistaxis, nasal congestion and sore throat  Respiratory: negative for sputum and wheezing  Cardiovascular: negative for chest pain,  lower extremity edema, orthopnea, palpitations and syncope  Gastrointestinal: negative for abdominal pain, constipation, diarrhea, melena, nausea and vomiting  Genitourinary:negative for dysuria, frequency and hematuria  Hematologic/lymphatic: negative for bleeding, easy bruising and lymphadenopathy  Musculoskeletal:negative for arthralgias, muscle weakness and stiff joints  Neurological: negative for coordination problems, gait problems, headaches and weakness  Endocrine: negative for  diabetic symptoms including polydipsia, polyuria and weight loss     Objective:   Physical Exam   Gen. Pleasant, well-nourished, in no distress, normal affect ENT - no lesions, no post nasal drip Neck: No JVD, no thyromegaly, no carotid bruits Lungs: no use of accessory muscles, no dullness to percussion, clear without rales or rhonchi  Cardiovascular: Rhythm regular, heart sounds  normal, no murmurs or gallops, no peripheral edema Abdomen: soft and non-tender, no hepatosplenomegaly, BS normal. Musculoskeletal: No deformities, no cyanosis or clubbing Neuro:  alert, non focal        Assessment & Plan:

## 2016-10-19 NOTE — Addendum Note (Signed)
Addended by: Sheran Luz on: 10/19/2016 12:54 PM   Modules accepted: Orders

## 2016-10-21 ENCOUNTER — Encounter: Payer: Self-pay | Admitting: Neurology

## 2016-10-22 LAB — RESPIRATORY ALLERGY PROFILE REGION II ~~LOC~~
Allergen, A. alternata, m6: <0.1 kU/L
Allergen, Cedar tree, t12: <0.1 kU/L
Allergen, Comm Silver Birch, t9: <0.1 kU/L
Allergen, Cottonwood, t14: <0.1 kU/L
Allergen, D pternoyssinus,d7: <0.1 kU/L
Allergen, Mouse Urine Protein, e78: <0.1 kU/L
Allergen, Oak,t7: <0.1 kU/L
CLASS: 0
CLASS: 0
CLASS: 0
CLASS: 0
CLASS: 0
CLASS: 0
CLASS: 0
CLASS: 0
CLASS: 0
CLASS: 0
Cat Dander: <0.1 kU/L
Class: 0
Class: 0
Class: 0
Class: 0
Class: 0
Class: 0
Class: 0
Class: 0
Class: 0
Class: 0
Class: 0
Class: 0
Class: 0
Class: 0
Cockroach: <0.1 kU/L
Dog Dander: <0.1 kU/L
Timothy Grass: <0.1 kU/L

## 2016-10-22 LAB — QUANTIFERON TB GOLD ASSAY (BLOOD)
QUANTIFERON(R)-TB GOLD: NEGATIVE
Quantiferon Nil Value: 0.23 IU/mL

## 2016-10-22 LAB — INTERPRETATION:

## 2016-10-23 ENCOUNTER — Ambulatory Visit: Payer: Medicaid Other | Admitting: Adult Health

## 2016-10-23 ENCOUNTER — Ambulatory Visit (INDEPENDENT_AMBULATORY_CARE_PROVIDER_SITE_OTHER): Payer: Medicaid Other | Admitting: Neurology

## 2016-10-23 ENCOUNTER — Encounter: Payer: Self-pay | Admitting: Neurology

## 2016-10-23 VITALS — BP 121/77 | HR 78 | Ht 65.0 in | Wt 147.0 lb

## 2016-10-23 DIAGNOSIS — G4733 Obstructive sleep apnea (adult) (pediatric): Secondary | ICD-10-CM | POA: Diagnosis not present

## 2016-10-23 DIAGNOSIS — Z9989 Dependence on other enabling machines and devices: Secondary | ICD-10-CM | POA: Diagnosis not present

## 2016-10-23 DIAGNOSIS — F329 Major depressive disorder, single episode, unspecified: Secondary | ICD-10-CM | POA: Diagnosis not present

## 2016-10-23 DIAGNOSIS — G3184 Mild cognitive impairment, so stated: Secondary | ICD-10-CM | POA: Diagnosis not present

## 2016-10-23 DIAGNOSIS — R05 Cough: Secondary | ICD-10-CM

## 2016-10-23 DIAGNOSIS — R053 Chronic cough: Secondary | ICD-10-CM

## 2016-10-23 NOTE — Patient Instructions (Signed)
Place chronic cough patient instructions here.

## 2016-10-23 NOTE — Progress Notes (Signed)
SLEEP MED[facility]INE CLINIC   Provider:  Melvyn Novas, M D  Referring Provider: Gwenyth Bender, MD  Primary Care Physician:  Kayla Bender, MD  Lesia Sago, MD primary neurologist .   Chief Complaint  Patient presents with  . Follow-up    follow up. pt with daughter rm 11, CPAP is working well. weak memory. forgetful.    Chief complaint according to patient : "According to the daughter the patient has a low energy feels tired, is fatigued and sleepy, she also lost her appetite." The daughter translates the visit. Depression is present. The patient feels that she misplaces things on a daily basis. Translator with patient and daughter.   Interval history from 10/23/2016. The patient insisted on the visit with a M.D., and we performed today a Mini-Mental Status Examination which is not a valid due to the language barrier present. The patient was able to recall 3 out of 3 words she also was aware of the [place] city and state, month and year but not to complete day. Her family feels that her forgetfulness is progressing. MMSE 16/30 - limited validity.  CPAP compliance is 87% , but often under 4 hours. AHI is 0.9/hr.  at 6.6 cm water pressure.   She has recently had non-productive cough. The coughing is some nights interfering with her CPAP use.  She is not on ACE inhibitors. Labs and chest X Ray were ordered by Pulmonology, Dr.Alva. Radiology recommended CT Chest to follow,  TB gold test was negative. Allergic asthma.  I hope she can improve her CPAP compliance.    FSS 56 points, Epworth 10.  History from 12/20/2015, Kayla Davies is here in the presence of her son and daughter for a follow-up visit she has been 100% compliant CPAP user average time of CPAP use is 6 hours and 4 minutes each night CPAP is set at 6.5 cm pressure was 1 cm EPR. She does have significant air leakage but her residual apnea index is low. Her AHI is 1.2. She is alert and better rested.     HPI:  Kayla Davies is a 61 y.o.  female, [place], seen here in the presence of her daughter as a referral  from Dr. August Saucer for a sleep evaluation, [name] has been established with another provider in the office, Dr. Lesia Sago who saw her for lumbago 2 years ago. She has been on Dexilant, Doxepin at night and vitamin D supplement She has a reported medical history of allergic rhinitis,  Asthma , GERD, memory loss- diagnosed 2 years ago as Alzheimer's dementia, foraminal stenosis of the lumbosacral region, joint pain affecting knee and hip and lower back vitamin D deficiency, headaches. The patient according to her daughter snores at night and has likely a history of sleep apnea , but she has not had a recent evaluation. She acknowledges that she feels depressed. This may also have caused her appetite or change.  Sleep habits are as follows: The patient states that she has variable bedtimes she believes that she mostly goes to bed around 9 PM, the patient states that she does not fall immediately asleep but often headaches bother her from going to sleep. In some extreme circumstances she may stay up until 2 AM while in bed waiting to fall asleep. She does not watch TV in bed and her bedroom is cool, quiet and dark. She sleeps alone in the same room. Even if she struggles for a long time  to fall asleep she cannot necessarily stay asleep throughout the night and she usually wakes up 2 or 3 times age time was trouble to go back to sleep. He reports that she has to go to the bathroom when she wakes up. She usually wakes up around 6 AM spontaneously without any alarm and she will remain awake until 11 or 11:30. Then she gets tired again and may nap in daytime. When she wakes up at 6 AM, she does not feel refreshed or restored. She endorsed that her joints hurt , her neck is stiff , her shoulders hurt and she has headaches. In daytime she may try to nap 3 or 4 times but she feels that she is always just drowsy but not  actually entering sleep.  Her nocturnal sleep is marked by loud snoring but the daughter has heard. Is also irregular breathing. The daughter said that it is extremely loud and that she can hear it in another room across the hallway.  Reviewed the patient's medication list. An attempt to perform a Montral cognitive assessment test was made but not completed I do not see an attempt to do a Mini-Mental Status evaluation which is usually the alternative. The geriatric depression score was endorsed at 5 points the Epworth sleepiness score at 12 points fatigue severity score at 58 points.  Sleep medical history and family sleep history: grandparents , maternal snored.  Social history: lives in [place] for 7 years , from [place]. Lives with daughter and 4 grand children.  No caffeine intake.    12-15-14 Kayla Davies is seen here today on 11-16- 16 to follow-up on her recent sleep study performed on 11-12-14. She had been referred by Dr. August Saucer. Her AHI was 13.8 and she slept all night on her side or prone. There was no accentuation with position noted. She slept for about 72% of the recorded time and had a long latency to dream sleep. REM sleep was first seen after 209 minutes of sleep. Oxygen desaturation was noted but was not maintained for a long time the nadir was 67% was 24.6 minutes of desaturation time total. There were no significant leg movements most arousals were spontaneous. Her daughter is most concerned that whatever device will be used in the treatment also addresses her mother's snoring. Snoring is "thunderous"and can be heard 3 rooms away at night.   03-31-15 Kayla Davies is here today again she underwent a polysomnogram the on 11/12/2014 revealing a mild apnea hypotony index 13.8. It was not accentuated during REM sleep and there was no supine sleep seen. She did have some oxygen desaturation for 24 minutes at night the nadir was 67%. Because the patient was so excessively daytime sleepy she return  for CPAP titration. At 6 cm water the AHI was 0.9 and the oxygen nadir raised to 92%. She also was able to enter REM sleep at 49 minutes. She was set to an air-fitP 10 mask and extra small size a so-called nasal pillow. The patient developed some pressure marks from her nasal mask she also has a full face mask at home. She showed a pressure marks on the nose bridge. I would like for her to try the air-fit P 10, she begun using this new mask on February 10 and for the last 20 days had an compliance of 93% and over 90% 4 hours of daily use over 4 hours. Average user time is 6 hours 39 minutes since February 10. A residual AHI is 1.6 which  is excellent she is using 6.6 cm water was 1 cm EPR. She had only one night with a significant air leak on Sunday, February 19th.   Review of Systems: Out of a complete 14 system review, the patient complains of only the following symptoms, and all other reviewed systems are negative. Snoring, sleepiness, fatigue improved  , but there is still a lot of depression. Fragmented sleep has improved. Epworth 14, 37 on FSS/  dry  tongue in AM, needs to adjust humidifier settings.      Social History   Social History  . Marital status: Single    Spouse name: N/A  . Number of children: 3  . Years of education: 0   Occupational History  . unemployed    Social History Main Topics  . Smoking status: Never Smoker  . Smokeless tobacco: Never Used  . Alcohol use No  . Drug use: No  . Sexual activity: No   Other Topics Concern  . Not on file   Social History Narrative   Lives at home with daughter  Shari Heritage works as a Nurse, learning disability.   No family history on file.  Past Medical History:  Diagnosis Date  . Allergic rhinitis, cause unspecified   . Arthropathy, unspecified, other specified sites   . Asymptomatic varicose veins   . Bladder incontinence   . Bowel incontinence   . Bronchitis   . Cervicalgia   . Edema   . Esophageal reflux   . Lumbago   . Obstructive  sleep apnea (adult) (pediatric)   . Other malaise and fatigue   . Pain in joint, shoulder region   . Radiculopathy of lumbar region   . Reactive airway disease   . Scoliosis (and kyphoscoliosis), idiopathic   . 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 Unspecified asthma(493.90)   . Unspecified disorder of lipoid metabolism   . Unspecified vitamin D deficiency   . Vertigo     Past Surgical History:  Procedure Laterality Date  . NECK SURGERY      Current Outpatient Prescriptions  Medication Sig Dispense Refill  . albuterol (PROVENTIL HFA) 108 (90 BASE) MCG/ACT inhaler Inhale 2 puffs into the lungs every 4 (four) hours as needed. For cough or SOB    . cetirizine (ZYRTEC) 10 MG tablet Take 1 tablet (10 mg total) by mouth daily as needed for allergies. 30 tablet 0  . DEXILANT 60 MG capsule TK 1 C PO QD  3  . meloxicam (MOBIC) 7.5 MG tablet 1-2 tabs by mouth daily with food as needed for pain. Do not take ibuprofen with this medicine. 30 tablet 0   No current facility-administered medications for this visit.     Allergies as of 10/23/2016  . (No Known Allergies)    Vitals: BP 121/77   Pulse 78   Ht 5\' 5"  (1.651 m)   Wt 147 lb (66.7 kg)   BMI 24.46 kg/m  Last Weight:  Wt Readings from Last 1 Encounters:  10/23/16 147 lb (66.7 kg)   GNF:AOZH mass index is 24.46 kg/m.      Weight ;  165 pounds.   Ht Readings from Last 1 Encounters:  10/23/16 5\' 5"  (1.651 m)    Physical exam:  General: The patient is awake, alert and appears not in acute distress. The patient is well groomed. Head: Normocephalic, atraumatic. Neck is supple. Mallampati 4,  neck circumference: 13 Nasal airflow unrestricited , TMJ click is evident . Retrognathia is seen.  Cardiovascular:  Regular rate  and rhythm, without  murmurs or carotid bruit, and without distended neck veins. Respiratory: Lungs are clear to auscultation. Skin:  Without evidence of edema, or rash Trunk: BMI is . The patient's posture is hunched.      Neurologic exam : The patient is awake and alert, oriented to place and time.   Memory subjective described as impaired , but she feels too fatigued to concentrate.  Memory testing revealed - 16 out of 30 points,  Likely invalid due to language barrier.  MMSE: Attention span & concentration ability appears normal.  Speech is fluent,  without dysarthria, dysphonia or aphasia. Limited validity  Mood and affect are appropriate, she appears less depressed, less hopeless than in previous visits.  Cranial nerves: No change in taste or smell. Pupils are equal and briskly reactive to light.  Extraocular movements  in vertical and horizontal planes intact and without nystagmus. Visual fields by finger perimetry are intact. Hearing to finger rub intact. Facial sensation intact to fine touch.Facial motor strength is symmetric and tongue and uvula move midline. Shoulder shrug was symmetrical.   The patient was advised of the nature of the diagnosed sleep disorder , the treatment options and risks for general a health and wellness arising from not treating the condition.  I spent more than 20  minutes of face to face time with the patient, explaining the compliance needs for insurance.  Her translator helped with the  Information.  She has done well since 10th of February -   This was prolonged due to language barrier, memory deficit and translator delay. Greater than 50% of time was spent in counseling and coordination of care. We have discussed the diagnosis and differential and I answered the patient's questions.     Assessment:  After physical and neurologic examination, review of laboratory studies, 25 minute visit.  Personal review of imaging studies, reports of other /same  Imaging studies ,  Results of polysomnography/ neurophysiology testing and pre-existing records as far as provided in visit., my assessment is   1) the patient is very fatigued which is evident here but I think she is severely  deeply depressed. I'm not sure if her memory testing is valid or if this is a pseudodementia.  The inability to follow the instruction otherwise can also be explained by the language . Her sleepiness and fatigue improved under CPAP use.  Epworth 10 on CPAP.     2)She has been on Aricept 10 mg. We stopped that in 2016 - she was  benefiting from a SSRI more . She will  return for MMSE in 9 month with NP.   3) she has OSA, AHi 13.8 at baseline,  uses CPAP now at 6.6 cm water pressure- with good AHI reduction to  0.9 /hr. Just has coughing interfering with CPAP compliance.  I would like Dr Vassie Loll to address treatment of cough, presumed allergic.   adjusted humidification on her CPAP for her-  well tolerated- I will ask her to return in 9 month with NP .    4) She is allergic to dust, grass, cigarette smoke, laundry detergent / disinfectants.    Plan:  Treatment plan and additional workup :   RV in 9 month  with Butch Penny, NP, add MMSE to that visit.   Depression is still present , family considers it severe.  Memory subjectively  Deteriorating, mostly forgetfulness, no paranoia, no sundowning, fluent speech. No aphasia .    OSA successfully controlled on low pressure  PAP, 6.6 cm water, yearly follow up .   She will bring her machine to the sleep lab for humidifier resetting.  Will ask her to come into sleep lab on a Thursday.     Porfirio Mylar Huy Majid MD  10/23/2016   CC: Kayla Bender, Md [address] [place], [place] [address]

## 2016-10-24 ENCOUNTER — Emergency Department (HOSPITAL_COMMUNITY)
Admission: EM | Admit: 2016-10-24 | Discharge: 2016-10-24 | Disposition: A | Discharge status: Home / Self Care | Payer: Medicaid Other | Attending: Emergency Medicine | Admitting: Emergency Medicine

## 2016-10-24 ENCOUNTER — Emergency Department (HOSPITAL_COMMUNITY): Payer: Medicaid Other

## 2016-10-24 ENCOUNTER — Encounter (HOSPITAL_COMMUNITY): Payer: Self-pay | Admitting: Emergency Medicine

## 2016-10-24 DIAGNOSIS — M791 Myalgia: Secondary | ICD-10-CM | POA: Insufficient documentation

## 2016-10-24 DIAGNOSIS — R1110 Vomiting, unspecified: Secondary | ICD-10-CM | POA: Diagnosis not present

## 2016-10-24 DIAGNOSIS — M899 Disorder of bone, unspecified: Secondary | ICD-10-CM

## 2016-10-24 DIAGNOSIS — Z79899 Other long term (current) drug therapy: Secondary | ICD-10-CM | POA: Diagnosis not present

## 2016-10-24 DIAGNOSIS — M25511 Pain in right shoulder: Secondary | ICD-10-CM | POA: Diagnosis not present

## 2016-10-24 DIAGNOSIS — Z041 Encounter for examination and observation following transport accident: Secondary | ICD-10-CM | POA: Insufficient documentation

## 2016-10-24 DIAGNOSIS — R1084 Generalized abdominal pain: Secondary | ICD-10-CM | POA: Insufficient documentation

## 2016-10-24 DIAGNOSIS — R51 Headache: Secondary | ICD-10-CM | POA: Diagnosis present

## 2016-10-24 DIAGNOSIS — M79604 Pain in right leg: Secondary | ICD-10-CM | POA: Insufficient documentation

## 2016-10-24 DIAGNOSIS — M25512 Pain in left shoulder: Secondary | ICD-10-CM | POA: Diagnosis not present

## 2016-10-24 DIAGNOSIS — M7918 Myalgia, other site: Secondary | ICD-10-CM

## 2016-10-24 LAB — URINALYSIS, ROUTINE W REFLEX MICROSCOPIC
BILIRUBIN URINE: NEGATIVE
Bacteria, UA: NONE SEEN
Glucose, UA: NEGATIVE mg/dL
KETONES UR: NEGATIVE mg/dL
LEUKOCYTES UA: NEGATIVE
NITRITE: NEGATIVE
Protein, ur: NEGATIVE mg/dL
SPECIFIC GRAVITY, URINE: 1.004 — AB (ref 1.005–1.030)
Squamous Epithelial / LPF: NONE SEEN
WBC UA: NONE SEEN WBC/hpf (ref 0–5)
pH: 5 (ref 5.0–8.0)

## 2016-10-24 MED ORDER — METHOCARBAMOL 500 MG PO TABS: 500.0000 mg | ORAL_TABLET | Freq: Four times a day (QID) | ORAL | 0 refills | Status: AC

## 2016-10-24 MED ORDER — NAPROXEN 500 MG PO TABS: 500.0000 mg | ORAL_TABLET | Freq: Two times a day (BID) | ORAL | 0 refills | Status: DC

## 2016-10-24 MED ORDER — METHOCARBAMOL 500 MG PO TABS: 500.0000 mg | ORAL_TABLET | Freq: Four times a day (QID) | ORAL | 0 refills | Status: DC

## 2016-10-24 MED ORDER — ACETAMINOPHEN 325 MG PO TABS
650.0000 mg | ORAL_TABLET | Freq: Once | ORAL | Status: AC
  Administered 2016-10-24: 650 mg via ORAL
  Filled 2016-10-24: qty 2
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
## 2016-10-24 NOTE — ED Notes (Signed)
No obvious injuries or deformities.
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
## 2016-11-05 ENCOUNTER — Encounter (HOSPITAL_COMMUNITY): Payer: Self-pay | Admitting: *Deleted

## 2016-11-05 ENCOUNTER — Ambulatory Visit (HOSPITAL_COMMUNITY)
Admission: EM | Admit: 2016-11-05 | Discharge: 2016-11-05 | Disposition: A | Discharge status: Home / Self Care | Payer: Medicaid Other | Attending: Urgent Care | Admitting: Urgent Care

## 2016-11-05 DIAGNOSIS — M5030 Other cervical disc degeneration, unspecified cervical region: Secondary | ICD-10-CM

## 2016-11-05 DIAGNOSIS — M25511 Pain in right shoulder: Secondary | ICD-10-CM

## 2016-11-05 DIAGNOSIS — M542 Cervicalgia: Secondary | ICD-10-CM

## 2016-11-05 DIAGNOSIS — M549 Dorsalgia, unspecified: Secondary | ICD-10-CM | POA: Diagnosis not present

## 2016-11-05 DIAGNOSIS — R9389 Abnormal findings on diagnostic imaging of other specified body structures: Secondary | ICD-10-CM

## 2016-11-05 DIAGNOSIS — M25561 Pain in right knee: Secondary | ICD-10-CM

## 2016-11-05 MED ORDER — KETOROLAC TROMETHAMINE 30 MG/ML IJ SOLN
INTRAMUSCULAR | Status: AC
  Filled 2016-11-05: qty 1

## 2016-11-05 MED ORDER — KETOROLAC TROMETHAMINE 30 MG/ML IJ SOLN
30.0000 mg | Freq: Once | INTRAMUSCULAR | Status: AC
  Administered 2016-11-05: 30 mg via INTRAMUSCULAR

## 2016-11-05 MED ORDER — ACETAMINOPHEN-CODEINE #3 300-30 MG PO TABS: 1.0000 | ORAL_TABLET | Freq: Three times a day (TID) | ORAL | 0 refills | Status: DC | PRN
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
## 2016-11-05 NOTE — Discharge Instructions (Addendum)
Take meloxicam 1-2 tablets daily. If she still has pain, then she can use Tylenol #3.    [facility] Address: [address], [address] [place], [place], [place] [address]  Hours: Open ? Closes 6PM (today) Phone: [phone]
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
## 2016-11-07 ENCOUNTER — Encounter: Payer: Self-pay | Admitting: Physical Therapy

## 2016-11-07 ENCOUNTER — Ambulatory Visit: Payer: Medicaid Other | Attending: Internal Medicine | Admitting: Physical Therapy

## 2016-11-07 DIAGNOSIS — M25511 Pain in right shoulder: Secondary | ICD-10-CM | POA: Diagnosis present

## 2016-11-07 DIAGNOSIS — G8929 Other chronic pain: Secondary | ICD-10-CM | POA: Diagnosis present

## 2016-11-07 DIAGNOSIS — M25611 Stiffness of right shoulder, not elsewhere classified: Secondary | ICD-10-CM | POA: Diagnosis present

## 2016-11-07 DIAGNOSIS — M545 Low back pain: Secondary | ICD-10-CM | POA: Diagnosis present

## 2016-11-07 DIAGNOSIS — R293 Abnormal posture: Secondary | ICD-10-CM | POA: Diagnosis present

## 2016-11-07 DIAGNOSIS — M62830 Muscle spasm of back: Secondary | ICD-10-CM
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
## 2016-11-14 ENCOUNTER — Encounter: Payer: Medicaid Other | Admitting: Physical Therapy

## 2016-11-16 ENCOUNTER — Encounter: Payer: Self-pay | Admitting: Adult Health

## 2016-11-16 ENCOUNTER — Ambulatory Visit (INDEPENDENT_AMBULATORY_CARE_PROVIDER_SITE_OTHER): Payer: Medicaid Other | Admitting: Adult Health

## 2016-11-16 DIAGNOSIS — R05 Cough: Secondary | ICD-10-CM | POA: Diagnosis not present

## 2016-11-16 DIAGNOSIS — J849 Interstitial pulmonary disease, unspecified: Secondary | ICD-10-CM

## 2016-11-16 DIAGNOSIS — R053 Chronic cough: Secondary | ICD-10-CM

## 2016-11-16 DIAGNOSIS — Z23 Encounter for immunization: Secondary | ICD-10-CM | POA: Diagnosis not present

## 2016-11-16 MED ORDER — BENZONATATE 200 MG PO CAPS: 200.0000 mg | ORAL_CAPSULE | Freq: Three times a day (TID) | ORAL | 3 refills | Status: DC | PRN

## 2016-11-16 NOTE — Patient Instructions (Addendum)
Begin Delsym 2 tsp Twice daily  For cough As needed  . This is over the counter. -you will need to buy at store.  Begin Tessalon Three times a day  For cough As needed  -this is a prescription that will be sent to pharmacy .  Begin Chlorpheniramine 4 mg daily at bedtime . This is over the counter , you will need to buy at store.  Set up HRCT chest .  Follow up Dr. Vassie LollAlva  In 6-8 weeks and As needed   Please contact office for sooner follow up if symptoms do not improve or worsen or seek emergency care
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
## 2016-11-16 NOTE — Assessment & Plan Note (Signed)
Chronic Cough ? Etiology  Bronchitic changes on CXR - check HRCT  Add cough meds   Plan  Patient Instructions  Begin Delsym 2 tsp Twice daily  For cough As needed  . This is over the counter. -you will need to buy at store.  Begin Tessalon Three times a day  For cough As needed  -this is a prescription that will be sent to pharmacy .  Begin Chlorpheniramine 4 mg daily at bedtime . This is over the counter , you will need to buy at store.  Set up HRCT chest .  Follow up Dr. Vassie LollAlva  In 6-8 weeks and As needed   Please contact office for sooner follow up if symptoms do not improve or worsen or seek emergency care

## 2016-11-19 ENCOUNTER — Ambulatory Visit: Payer: Medicaid Other | Admitting: Rehabilitation

## 2016-11-19 DIAGNOSIS — M25611 Stiffness of right shoulder, not elsewhere classified: Secondary | ICD-10-CM

## 2016-11-19 DIAGNOSIS — M25511 Pain in right shoulder: Principal | ICD-10-CM

## 2016-11-19 DIAGNOSIS — G8929 Other chronic pain: Secondary | ICD-10-CM

## 2016-11-19 DIAGNOSIS — R293 Abnormal posture: Secondary | ICD-10-CM

## 2016-11-19 DIAGNOSIS — M62830 Muscle spasm of back: Secondary | ICD-10-CM

## 2016-11-19 DIAGNOSIS — M545 Low back pain: Secondary | ICD-10-CM
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
## 2016-11-21 ENCOUNTER — Telehealth: Payer: Self-pay | Admitting: Pulmonary Disease

## 2016-11-21 NOTE — Telephone Encounter (Signed)
lmtcb for pt.  

## 2016-11-22 ENCOUNTER — Ambulatory Visit: Payer: Medicaid Other | Admitting: Physical Therapy

## 2016-11-22 ENCOUNTER — Inpatient Hospital Stay: Admission: RE | Admit: 2016-11-22 | Payer: Medicaid Other | Source: Ambulatory Visit

## 2016-11-22 DIAGNOSIS — M25611 Stiffness of right shoulder, not elsewhere classified: Secondary | ICD-10-CM

## 2016-11-22 DIAGNOSIS — M25511 Pain in right shoulder: Principal | ICD-10-CM

## 2016-11-22 DIAGNOSIS — M62830 Muscle spasm of back: Secondary | ICD-10-CM

## 2016-11-22 DIAGNOSIS — G8929 Other chronic pain: Secondary | ICD-10-CM

## 2016-11-22 DIAGNOSIS — M545 Low back pain: Secondary | ICD-10-CM

## 2016-11-22 DIAGNOSIS — R293 Abnormal posture: Secondary | ICD-10-CM

## 2016-11-22 NOTE — Telephone Encounter (Signed)
lmtcb x2 

## 2016-11-22 NOTE — Telephone Encounter (Signed)
lmomtcb x 3  

## 2016-11-22 NOTE — Therapy (Signed)
[facility] [address] [place], [place], [address] Phone: [phone]   Fax:  [phone]  Physical Therapy Treatment  Patient Details  Name: Kayla Davies MRN: [id number] Date of Birth: 11/04/1955 Referring Provider: Willey Blade MD  Encounter Date: 11/22/2016      PT End of Session - 11/22/16 1038    Visit Number 3   Number of Visits 4   Date for PT Re-Evaluation 12/05/16   PT Start Time 1038  pt arrived 15 min late today, then had to use the restroom before starting   PT Stop Time 1105   PT Time Calculation (min) 27 min   Activity Tolerance Patient tolerated treatment well   Behavior During Therapy [facility] for tasks assessed/performed      Past Medical History:  Diagnosis Date  . Allergic rhinitis, cause unspecified   . Arthropathy, unspecified, other specified sites   . Asymptomatic varicose veins   . Bladder incontinence   . Bowel incontinence   . Bronchitis   . Cervicalgia   . Edema   . Esophageal reflux   . Lumbago   . Obstructive sleep apnea (adult) (pediatric)   . Other malaise and fatigue   . Pain in joint, shoulder region   . Radiculopathy of lumbar region   . Reactive airway disease   . Scoliosis (and kyphoscoliosis), idiopathic   . 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 Unspecified asthma(493.90)   . Unspecified disorder of lipoid metabolism   . Unspecified vitamin D deficiency   . Vertigo     Past Surgical History:  Procedure Laterality Date  . NECK SURGERY      There were no vitals filed for this visit.      Subjective Assessment - 11/22/16 1039    Subjective " I am feeling about the same since last session"   Currently in Pain? Yes   Pain Score 8    Pain Orientation Right   Pain Score 8   Pain Location Back   Pain Orientation Lower   Pain Descriptors / Indicators Burning   Pain Onset More than a month ago   Pain Frequency Constant                         [facility] Adult PT  Treatment/Exercise - 11/22/16 1041      Moist Heat Therapy   Number Minutes Moist Heat 10 Minutes   Moist Heat Location Lumbar Spine;Cervical     Manual Therapy   Manual Therapy --   Manual therapy comments trigger point release, R upper trapezius x 2, R levator scapulae x 1   Joint Mobilization C3-C6 central P-A, grade 2-3; C3-C6 neutral gapping to right, R 1st rib inferior, grade 2-3    Manual Traction upper cervical   grade 2-3                 PT Education - 11/22/16 1101    Education provided Yes   Education Details discussed benefits of arriving early to treament to get the most out of PT.    Person(s) Educated Patient   Methods Explanation   Comprehension Verbalized understanding          PT Short Term Goals - 11/19/16 1242      PT SHORT TERM GOAL #1   Title pt to be I with inital HEP    Status On-going     PT SHORT TERM GOAL #2   Title verbalize and  demo proper posture to prevent and reduce low back, neck and shoulder pain    Status On-going           PT Long Term Goals - 11/07/16 1223      PT LONG TERM GOAL #1   Title pt to increase R shoulder AROM to >/= 60 degrees with flexion/ abduction to promtoe functional reaching required for ADLs    Time 3   Period Weeks   Status New   Target Date 11/28/16     PT LONG TERM GOAL #2   Title increase R shoulder strength to >/= 3+/5 with </= 4/10 pain for functional lifting and carrying activities    Time 3   Period Weeks   Status New   Target Date 11/28/16     PT LONG TERM GOAL #3   Title increase trunk mobility by >/= 10 degrees in all planes to promote functional trunk mobility    Baseline flexion 20 degrees, extension 10 degrees and bil sidebending 5 degrees    Time 3   Period Weeks   Status New   Target Date 11/28/16               Plan - 11/22/16 1058    Clinical Impression Statement pt arrived signifcantly late today, due to pt arriving late focus on manual techniques to relieve R  shoulder muscle tightness and promote cervical mobility. pt reports continued pain at 8/10 prior to starting with minimal improvement throughout session required multiple cues to relax. MHP post session to calm down soreness.    PT Next Visit Plan continue pain relief and HEP education for Rt shoulder, neck and low back, STM, modalities, Re-evaluation ([facility])   PT Home Exercise Plan chin tuck, upper trap stretch, table slides flexion/ abduction, scapular retraction,lower trunk rotation, hamstring stretch, supine marching,       Patient will benefit from skilled therapeutic intervention in order to improve the following deficits and impairments:  Pain, Postural dysfunction, Improper body mechanics, Decreased mobility, Decreased strength, Decreased range of motion, Decreased endurance, Increased fascial restricitons, Increased muscle spasms, Decreased activity tolerance, Impaired UE functional use  Visit Diagnosis: Chronic right shoulder pain  Stiffness of right shoulder, not elsewhere classified  Abnormal posture  Bilateral low back pain, unspecified chronicity, with sciatica presence unspecified  Muscle spasm of back     Problem List Patient Active Problem List   Diagnosis Date Noted  . MCI (mild cognitive impairment) 10/23/2016  . Chronic cough 10/19/2016  . Moderate single current episode of major depressive disorder (HCC) 12/20/2015  . OSA on CPAP 03/31/2015  . Other seasonal allergic rhinitis 03/31/2015  . 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 Muscle tension headache 10/06/2014  . Depression (emotion) 10/06/2014  . Dementia arising in the senium and presenium 10/06/2014  . Hypersomnia with sleep apnea 10/06/2014  . Neuropathy 07/08/2012  . Back pain 07/08/2012  . HYPERCHOLESTEROLEMIA 04/06/2010  . OVERWEIGHT 04/05/2010  . SKIN RASH 03/08/2010  . VERTIGO 05/06/2009  . CONJUNCTIVITIS, ALLERGIC 02/03/2009  . DYSPNEA ON EXERTION 11/03/2008  . SLEEP APNEA, OBSTRUCTIVE,  MILD 07/28/2008  . VITAMIN D DEFICIENCY 06/25/2008  . FATIGUE 06/23/2008  . EDEMA 06/23/2008  . ALLERGIC RHINITIS 05/10/2008  . SHOULDER PAIN, RIGHT 04/22/2008  . NECK PAIN 10/27/2007  . REACTIVE AIRWAY DISEASE 10/01/2007  . PELVIC  PAIN 10/01/2007  . 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 POSTMENOPAUSAL STATUS 08/18/2007  . SCOLIOSIS, LUMBAR SPINE 08/18/2007  . VARICOSE VEINS, LOWER EXTREMITIES 07/28/2007  .  GERD 07/28/2007  . 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 LUMBAGO 07/28/2007  . POSITIVE PPD 05/19/2007   Lulu Riding PT, DPT, LAT, ATC  11/22/16  11:02 AM      [facility] [address] [place], [place], [address] Phone: [phone]   Fax:  [phone]  Name: Kayla Davies MRN: [id number] Date of Birth: Jul 20, 1955

## 2016-11-22 NOTE — Progress Notes (Signed)
Reviewed & agree with plan  

## 2016-11-27 ENCOUNTER — Ambulatory Visit: Payer: Medicaid Other | Admitting: Physical Therapy

## 2016-11-27 ENCOUNTER — Encounter: Payer: Self-pay | Admitting: Physical Therapy

## 2016-11-27 DIAGNOSIS — M25511 Pain in right shoulder: Principal | ICD-10-CM

## 2016-11-27 DIAGNOSIS — G8929 Other chronic pain: Secondary | ICD-10-CM

## 2016-11-27 DIAGNOSIS — M25611 Stiffness of right shoulder, not elsewhere classified: Secondary | ICD-10-CM

## 2016-11-27 DIAGNOSIS — R293 Abnormal posture: Secondary | ICD-10-CM

## 2016-11-27 DIAGNOSIS — M545 Low back pain: Secondary | ICD-10-CM

## 2016-11-27 DIAGNOSIS — M62830 Muscle spasm of back: Secondary | ICD-10-CM

## 2016-11-27 NOTE — Therapy (Signed)
[place], [place], [address] Phone: [phone]   Fax:  [phone]  Physical Therapy Treatment  Patient Details  Name: Kayla Davies MRN: [id number] Date of Birth: [birth year] Referring Provider: Kevan Ny MD  Encounter Date: 11/27/2016      PT End of Session - 11/27/16 1822    Visit Number 4   Number of Visits 4   Date for PT Re-Evaluation 12/05/16   PT Start Time 1017   PT Stop Time 1125   PT Time Calculation (min) 68 min   Activity Tolerance Patient tolerated treatment well   Behavior During Therapy [facility] for tasks assessed/performed      Past Medical History:  Diagnosis Date  . Allergic rhinitis, cause unspecified   . Arthropathy, unspecified, other specified sites   . Asymptomatic varicose veins   . Bladder incontinence   . Bowel incontinence   . Bronchitis   . Cervicalgia   . Edema   . Esophageal reflux   . Lumbago   . Obstructive sleep apnea (adult) (pediatric)   . Other malaise and fatigue   . Pain in joint, shoulder region   . Radiculopathy of lumbar region   . Reactive airway disease   . Scoliosis (and kyphoscoliosis), idiopathic   . 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 Unspecified asthma(493.90)   . Unspecified disorder of lipoid metabolism   . Unspecified vitamin D deficiency   . Vertigo     Past Surgical History:  Procedure Laterality Date  . NECK SURGERY      There were no vitals filed for this visit.      Subjective Assessment - 11/27/16 1022    Subjective Pain constant right shoulder.  Right hand feels weak     Pain Score 9    Pain Location Shoulder   Pain Orientation Right   Pain Descriptors / Indicators Sore;Shooting;Constant  weak   Pain Radiating Towards neck and arm   Pain Onset More than a month ago   Pain Frequency Constant   Aggravating Factors  any activity   Pain Relieving Factors meds,  rest,  not much   Pain Score 9   Pain Location Back   Pain  Orientation Lower   Pain Descriptors / Indicators Burning   Pain Radiating Towards into right proximal thigh   Pain Frequency Constant   Aggravating Factors  standing walkking   Pain Relieving Factors meds,  brace daytime since OCT 25            [facility] PT Assessment - 11/27/16 0001      Strength   Overall Strength --  GRIP LT 25 average,  right grip 3 LBS average                     [facility] Adult PT Treatment/Exercise - 11/27/16 0001      Self-Care   Other Self-Care Comments  try to wean from brace,  especially when not active.     Neck Exercises: Seated   Neck Retraction Limitations 5  chin tuck   Other Seated Exercise scapular retraction x 10 with postural cueing needed   Other Seated Exercise Crevical ROM,  needed use of habd to support head to decrease strain allowing stretches.  UE ranger seated        Moist Heat Therapy   Number Minutes Moist Heat 20 Minutes   Moist Heat Location Lumbar Spine;Shoulder     Manual Therapy   Manual therapy  comments soft tissue work,  tissue softened rt neck,  shoulder,  areas   Soft tissue mobilization tissue softned,  pain reduced.                  PT Short Term Goals - 11/19/16 1242      PT SHORT TERM GOAL #1   Title pt to be I with inital HEP    Status On-going     PT SHORT TERM GOAL #2   Title verbalize and demo proper posture to prevent and reduce low back, neck and shoulder pain    Status On-going           PT Long Term Goals - 11/07/16 1223      PT LONG TERM GOAL #1   Title pt to increase R shoulder AROM to >/= 60 degrees with flexion/ abduction to promtoe functional reaching required for ADLs    Time 3   Period Weeks   Status New   Target Date 11/28/16     PT LONG TERM GOAL #2   Title increase R shoulder strength to >/= 3+/5 with </= 4/10 pain for functional lifting and carrying activities    Time 3   Period Weeks   Status New   Target Date 11/28/16     PT LONG TERM GOAL #3   Title  increase trunk mobility by >/= 10 degrees in all planes to promote functional trunk mobility    Baseline flexion 20 degrees, extension 10 degrees and bil sidebending 5 degrees    Time 3   Period Weeks   Status New   Target Date 11/28/16               Plan - 11/27/16 1823    Clinical Impression Statement AROM Rotation 62 left  cercical.  ,    Patinet focused on ROM today with neck and shoulder,  No new goals met.  Less pain at end of session.     PT Treatment/Interventions ADLs/Self Care Home Management;Electrical Stimulation;Cryotherapy;Iontophoresis 14m/ml Dexamethasone;Moist Heat;Ultrasound;Therapeutic exercise;Therapeutic activities;Dry needling;Taping;Manual techniques;Neuromuscular re-education;Passive range of motion;Patient/family education   PT Next Visit Plan continue pain relief and HEP education for Rt shoulder, neck and low back, STM, modalities, Re-evaluation ([facility])   PT Home Exercise Plan chin tuck, upper trap stretch, table slides flexion/ abduction, scapular retraction,lower trunk rotation, hamstring stretch, supine marching,    Consulted and Agree with Plan of Care Patient      Patient will benefit from skilled therapeutic intervention in order to improve the following deficits and impairments:     Visit Diagnosis: Chronic right shoulder pain  Stiffness of right shoulder, not elsewhere classified  Abnormal posture  Bilateral low back pain, unspecified chronicity, with sciatica presence unspecified  Muscle spasm of back     Problem List Patient Active Problem List   Diagnosis Date Noted  . MCI (mild cognitive impairment) 10/23/2016  . Chronic cough 10/19/2016  . Moderate single current episode of major depressive disorder ([place] 12/20/2015  . OSA on CPAP 03/31/2015  . Other seasonal allergic rhinitis 03/31/2015  . 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 Muscle tension headache 10/06/2014  . Depression (emotion) 10/06/2014  . Dementia arising in  the senium and presenium 10/06/2014  . Hypersomnia with sleep apnea 10/06/2014  . Neuropathy 07/08/2012  . Back pain 07/08/2012  . HYPERCHOLESTEROLEMIA 04/06/2010  . OVERWEIGHT 04/05/2010  . SKIN RASH 03/08/2010  . VERTIGO 05/06/2009  . CONJUNCTIVITIS, ALLERGIC 02/03/2009  . DYSPNEA ON EXERTION 11/03/2008  .  SLEEP APNEA, OBSTRUCTIVE, MILD 07/28/2008  . VITAMIN D DEFICIENCY 06/25/2008  . FATIGUE 06/23/2008  . EDEMA 06/23/2008  . ALLERGIC RHINITIS 05/10/2008  . SHOULDER PAIN, RIGHT 04/22/2008  . NECK PAIN 10/27/2007  . REACTIVE AIRWAY DISEASE 10/01/2007  . PELVIC  PAIN 10/01/2007  . 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 POSTMENOPAUSAL STATUS 08/18/2007  . SCOLIOSIS, LUMBAR SPINE 08/18/2007  . VARICOSE VEINS, LOWER EXTREMITIES 07/28/2007  . GERD 07/28/2007  . 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 LUMBAGO 07/28/2007  . POSITIVE PPD 05/19/2007    Kayla Davies PTA 11/27/2016, 6:25 PM  [facility] [address] [place], [place], [address] Phone: [phone]   Fax:  [phone]  Name: ADIE VILAR MRN: [id number] Date of Birth: Aug 04, 1955

## 2016-11-27 NOTE — Patient Instructions (Signed)
Wean from brace when not active.

## 2016-11-28 ENCOUNTER — Inpatient Hospital Stay: Admission: RE | Admit: 2016-11-28 | Payer: Medicaid Other | Source: Ambulatory Visit

## 2016-11-29 ENCOUNTER — Encounter: Payer: Medicaid Other | Admitting: Physical Therapy

## 2016-12-10 ENCOUNTER — Ambulatory Visit (INDEPENDENT_AMBULATORY_CARE_PROVIDER_SITE_OTHER)
Admission: RE | Admit: 2016-12-10 | Discharge: 2016-12-10 | Disposition: A | Discharge status: Home / Self Care | Payer: Medicaid Other | Source: Ambulatory Visit | Attending: Adult Health | Admitting: Adult Health

## 2016-12-10 DIAGNOSIS — J849 Interstitial pulmonary disease, unspecified: Secondary | ICD-10-CM | POA: Diagnosis not present

## 2016-12-13 ENCOUNTER — Ambulatory Visit: Payer: Medicaid Other | Attending: Internal Medicine | Admitting: Physical Therapy

## 2016-12-13 DIAGNOSIS — R293 Abnormal posture: Secondary | ICD-10-CM | POA: Diagnosis present

## 2016-12-13 DIAGNOSIS — M545 Low back pain: Secondary | ICD-10-CM | POA: Diagnosis present

## 2016-12-13 DIAGNOSIS — G8929 Other chronic pain: Secondary | ICD-10-CM

## 2016-12-13 DIAGNOSIS — M25611 Stiffness of right shoulder, not elsewhere classified: Secondary | ICD-10-CM

## 2016-12-13 DIAGNOSIS — M25511 Pain in right shoulder: Secondary | ICD-10-CM | POA: Insufficient documentation

## 2016-12-13 DIAGNOSIS — M62830 Muscle spasm of back: Secondary | ICD-10-CM | POA: Diagnosis present

## 2016-12-13 NOTE — Therapy (Signed)
[facility] [address] [place], [place], [address] Phone: [phone]   Fax:  [phone]  Physical Therapy Treatment / Re-certification  Patient Details  Name: Kayla Davies MRN: [id number] Date of Birth: 1955-10-01 Referring Provider: Willey Blade MD   Encounter Date: 12/13/2016  PT End of Session - 12/13/16 0952    Visit Number  5    Number of Visits  11    Date for PT Re-Evaluation  01/10/17    Authorization Type  MCD    PT Start Time  0952 pt arrived 22 minutes late for appt    PT Stop Time  1020    PT Time Calculation (min)  28 min    Activity Tolerance  Patient tolerated treatment well    Behavior During Therapy  [facility] for tasks assessed/performed       Past Medical History:  Diagnosis Date  . Allergic rhinitis, cause unspecified   . Arthropathy, unspecified, other specified sites   . Asymptomatic varicose veins   . Bladder incontinence   . Bowel incontinence   . Bronchitis   . Cervicalgia   . Edema   . Esophageal reflux   . Lumbago   . Obstructive sleep apnea (adult) (pediatric)   . Other malaise and fatigue   . Pain in joint, shoulder region   . Radiculopathy of lumbar region   . Reactive airway disease   . Scoliosis (and kyphoscoliosis), idiopathic   . 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 Unspecified asthma(493.90)   . Unspecified disorder of lipoid metabolism   . Unspecified vitamin D deficiency   . Vertigo     Past Surgical History:  Procedure Laterality Date  . NECK SURGERY      There were no vitals filed for this visit.  Subjective Assessment - 12/13/16 0954    Subjective  "I feel okay, alittle bit. I am doing the exercises but the pain seems to return. I am having a headache today"    Currently in Pain?  Yes    Pain Score  7     Pain Location  Shoulder    Pain Orientation  Right    Pain Descriptors / Indicators  Aching;Sharp    Pain Onset  More than a month ago    Pain Frequency  Constant     Aggravating Factors   any activity    Pain Relieving Factors  meds, rest, not much    Pain Score  8    Pain Location  Back    Pain Orientation  Lower    Pain Descriptors / Indicators  Burning    Pain Onset  More than a month ago    Pain Frequency  Constant    Aggravating Factors   standing, walking.          [facility] PT Assessment - 12/13/16 0957      AROM   Right Shoulder Extension  28 Degrees    Right Shoulder Flexion  35 Degrees    Right Shoulder ABduction  48 Degrees    Right Shoulder Internal Rotation  45 Degrees    Right Shoulder Horizontal ABduction  15 Degrees    Lumbar Flexion  10    Lumbar Extension  3    Lumbar - Right Side Bend  3    Lumbar - Left Side Bend  3      Strength   Right Shoulder Flexion  3/5    Right Shoulder Extension  3/5  Right Shoulder ABduction  3/5    Right Shoulder Internal Rotation  3/5      Palpation   Palpation comment  TTP globally for R hsoulder with specific tenderness an tightness in R upper trap/ levator scapulae, sub-acromial space. Low back TTP in bil lumbar paraspinals, L/R PSIS                           PT Education - 12/13/16 1017    Education provided  Yes    Education Details  discussed POC, and benefits of exercise as well as arriving to appts on time to get the most treatment.  extra time needed for interpretation    Person(s) Educated  Patient    Methods  Explanation;Verbal cues    Comprehension  Verbalized understanding;Verbal cues required       PT Short Term Goals - 12/13/16 1043      PT SHORT TERM GOAL #1   Title  pt to be I with inital HEP     Baseline  needs cues    Time  3    Period  Weeks    Status  On-going      PT SHORT TERM GOAL #2   Title  verbalize and demo proper posture to prevent and reduce low back, neck and shoulder pain     Baseline  continued cues for proper form    Period  Weeks    Status  On-going        PT Long Term Goals - 12/13/16 1044      PT LONG TERM GOAL #1    Title  pt to increase R shoulder AROM to >/= 60 degrees with flexion/ abduction to promtoe functional reaching required for ADLs     Baseline  shoulder flexion 35 degrees, abduction 48 degrees with 7/10 pain    Time  3    Period  Weeks    Status  On-going      PT LONG TERM GOAL #2   Title  increase R shoulder strength to >/= 3+/5 with </= 4/10 pain for functional lifting and carrying activities     Baseline  3-/5 in all planes    Time  3    Period  Weeks    Status  On-going      PT LONG TERM GOAL #3   Title  increase trunk mobility by >/= 10 degrees in all planes to promote functional trunk mobility     Baseline  flexion 10, extension 3 degrees , bil side bending 3 degrees    Time  3    Period  Weeks    Status  On-going            Plan - 12/13/16 1022    Clinical Impression Statement  pt arrived 22 min late today. limited treatment today due pt arriving late and re-assessment with time needed for interpretation. she is demonstrating progress with shoulder function but  continues to demonstrate difficulty with low back mobility. Reviewed HEP which she required verbal cues/ demonstrate for proper form. She would benefit from continued physical therapy     PT Frequency  2x / week    PT Duration  3 weeks    PT Next Visit Plan  continue pain relief and HEP education for Rt shoulder, neck and low back, STM, modali    PT Home Exercise Plan  chin tuck, upper trap stretch, table slides flexion/ abduction, scapular retraction,lower trunk  rotation, hamstring stretch, supine marching,     Consulted and Agree with Plan of Care  Patient       Patient will benefit from skilled therapeutic intervention in order to improve the following deficits and impairments:  Pain, Postural dysfunction, Improper body mechanics, Decreased mobility, Decreased strength, Decreased range of motion, Decreased endurance, Increased fascial restricitons, Increased muscle spasms, Decreased activity tolerance,  Impaired UE functional use  Visit Diagnosis: Chronic right shoulder pain  Stiffness of right shoulder, not elsewhere classified  Abnormal posture  Bilateral low back pain, unspecified chronicity, with sciatica presence unspecified  Muscle spasm of back     Problem List Patient Active Problem List   Diagnosis Date Noted  . MCI (mild cognitive impairment) 10/23/2016  . Chronic cough 10/19/2016  . Moderate single current episode of major depressive disorder (HCC) 12/20/2015  . OSA on CPAP 03/31/2015  . Other seasonal allergic rhinitis 03/31/2015  . 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 Muscle tension headache 10/06/2014  . Depression (emotion) 10/06/2014  . Dementia arising in the senium and presenium 10/06/2014  . Hypersomnia with sleep apnea 10/06/2014  . Neuropathy 07/08/2012  . Back pain 07/08/2012  . HYPERCHOLESTEROLEMIA 04/06/2010  . OVERWEIGHT 04/05/2010  . SKIN RASH 03/08/2010  . VERTIGO 05/06/2009  . CONJUNCTIVITIS, ALLERGIC 02/03/2009  . DYSPNEA ON EXERTION 11/03/2008  . SLEEP APNEA, OBSTRUCTIVE, MILD 07/28/2008  . VITAMIN D DEFICIENCY 06/25/2008  . FATIGUE 06/23/2008  . EDEMA 06/23/2008  . ALLERGIC RHINITIS 05/10/2008  . SHOULDER PAIN, RIGHT 04/22/2008  . NECK PAIN 10/27/2007  . REACTIVE AIRWAY DISEASE 10/01/2007  . PELVIC  PAIN 10/01/2007  . 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 POSTMENOPAUSAL STATUS 08/18/2007  . SCOLIOSIS, LUMBAR SPINE 08/18/2007  . VARICOSE VEINS, LOWER EXTREMITIES 07/28/2007  . GERD 07/28/2007  . 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 LUMBAGO 07/28/2007  . POSITIVE PPD 05/19/2007   Lulu RidingKristoffer Johnna Bollier PT, DPT, LAT, ATC  12/13/16  10:48 AM      [facility] [address] [place], [place], [address] Phone: [phone]   Fax:  [phone]  Name: Kayla Davies MRN: [id number] Date of Birth: 04/12/1955

## 2016-12-14 ENCOUNTER — Encounter: Payer: Medicaid Other | Admitting: Physical Therapy

## 2016-12-17 ENCOUNTER — Encounter: Payer: Medicaid Other | Admitting: Physical Therapy

## 2016-12-18 ENCOUNTER — Encounter: Payer: Medicaid Other | Admitting: Physical Therapy

## 2016-12-24 ENCOUNTER — Encounter: Payer: Self-pay | Admitting: Physical Therapy

## 2016-12-24 ENCOUNTER — Ambulatory Visit: Payer: Medicaid Other | Admitting: Physical Therapy

## 2016-12-24 DIAGNOSIS — G8929 Other chronic pain: Secondary | ICD-10-CM

## 2016-12-24 DIAGNOSIS — M545 Low back pain: Secondary | ICD-10-CM

## 2016-12-24 DIAGNOSIS — R293 Abnormal posture: Secondary | ICD-10-CM

## 2016-12-24 DIAGNOSIS — M25511 Pain in right shoulder: Secondary | ICD-10-CM | POA: Diagnosis not present

## 2016-12-24 DIAGNOSIS — M25611 Stiffness of right shoulder, not elsewhere classified: Secondary | ICD-10-CM

## 2016-12-24 DIAGNOSIS — M62830 Muscle spasm of back: Secondary | ICD-10-CM

## 2016-12-24 NOTE — Therapy (Signed)
[facility] [address] [place], [place], [address] Phone: [phone]   Fax:  [phone]  Physical Therapy Treatment  Patient Details  Name: Kayla Davies MRN: [id number] Date of Birth: 1955-03-24 Referring Provider: Willey Blade MD   Encounter Date: 12/24/2016  PT End of Session - 12/24/16 1325    Visit Number  6    Number of Visits  11    Date for PT Re-Evaluation  01/10/17    PT Start Time  1030    PT Stop Time  1130    PT Time Calculation (min)  60 min    Activity Tolerance  Patient tolerated treatment well    Behavior During Therapy  [facility] for tasks assessed/performed       Past Medical History:  Diagnosis Date  . Allergic rhinitis, cause unspecified   . Arthropathy, unspecified, other specified sites   . Asymptomatic varicose veins   . Bladder incontinence   . Bowel incontinence   . Bronchitis   . Cervicalgia   . Edema   . Esophageal reflux   . Lumbago   . Obstructive sleep apnea (adult) (pediatric)   . Other malaise and fatigue   . Pain in joint, shoulder region   . Radiculopathy of lumbar region   . Reactive airway disease   . Scoliosis (and kyphoscoliosis), idiopathic   . 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 Unspecified asthma(493.90)   . Unspecified disorder of lipoid metabolism   . Unspecified vitamin D deficiency   . Vertigo     Past Surgical History:  Procedure Laterality Date  . NECK SURGERY      There were no vitals filed for this visit.  Subjective Assessment - 12/24/16 1109    Subjective  I am cold.  pain varies but usually 7/10 in shoulder abd back.  No questions.    Patient is accompained by:  Interpreter phone line.    Currently in Pain?  Yes    Pain Score  7     Pain Location  Shoulder    Pain Orientation  Right    Pain Descriptors / Indicators  Aching;Sharp    Pain Radiating Towards  neck and arm    Pain Frequency  Constant    Aggravating Factors   any activity    Pain Relieving Factors   meds,  rest    Pain Score  7    Pain Location  Back    Pain Orientation  Lower    Pain Frequency  Constant    Aggravating Factors   standing,  walking    Pain Relieving Factors  brace,  meds         [facility] PT Assessment - 12/24/16 0001      AROM   Right Shoulder Flexion  55 Degrees    Right Shoulder ABduction  68 Degrees                  [facility] Adult PT Treatment/Exercise - 12/24/16 0001      Neck Exercises: Seated   Neck Retraction Limitations  5 X    Other Seated Exercise  scapular retraction x 10 with postural cueing needed      Lumbar Exercises: Stretches   Passive Hamstring Stretch  2 reps;30 seconds      Lumbar Exercises: Seated   Long Arc Quad on [place]  5 reps;Both sitting on chair.    Other Seated Lumbar Exercises  pelvic tilt X 5,  abdominal isometrics 5 x  1 exhals both hand and single hands      Shoulder Exercises: ROM/Strengthening   Other ROM/Strengthening Exercises  retraction 5 X circles 5 x      Shoulder Exercises: Isometric Strengthening   Extension  5X5"    Internal Rotation  5X5"    ABduction  5X5"      Ultrasound   Ultrasound Location  shoulder, right    Ultrasound Parameters  1.5 watts /cm2, 100%,  8 minutes    Ultrasound Goals  Pain      Manual Therapy   Manual therapy comments  soft tissue work right shoulder    Passive ROM  right shoulder and upper trap      Neck Exercises: Stretches   Upper Trapezius Stretch  2 reps;10 seconds each               PT Short Term Goals - 12/24/16 1541      PT SHORT TERM GOAL #1   Title  pt to be I with inital HEP     Baseline  needs cues    Time  3    Period  Weeks    Status  On-going      PT SHORT TERM GOAL #2   Title  verbalize and demo proper posture to prevent and reduce low back, neck and shoulder pain     Baseline  continued cues for proper form    Time  3    Period  Weeks    Status  On-going        PT Long Term Goals - 12/13/16 1044      PT LONG TERM GOAL #1   Title   pt to increase R shoulder AROM to >/= 60 degrees with flexion/ abduction to promtoe functional reaching required for ADLs     Baseline  shoulder flexion 35 degrees, abduction 48 degrees with 7/10 pain    Time  3    Period  Weeks    Status  On-going      PT LONG TERM GOAL #2   Title  increase R shoulder strength to >/= 3+/5 with </= 4/10 pain for functional lifting and carrying activities     Baseline  3-/5 in all planes    Time  3    Period  Weeks    Status  On-going      PT LONG TERM GOAL #3   Title  increase trunk mobility by >/= 10 degrees in all planes to promote functional trunk mobility     Baseline  flexion 10, extension 3 degrees , bil side bending 3 degrees    Time  3    Period  Weeks    Status  On-going            Plan - 12/24/16 1326    Clinical Impression Statement  Patient arrived 14 minutes late today,  Extra time needed to remove 7 layers of clothing to get to her shoulder/neck.  PROM shoulder flexion  and Abd improving, see flow sheet.  Less pain at end of session. Phone interperter used,      PT Next Visit Plan  continue pain relief and HEP education for Rt shoulder, neck and low back, STM, modali  assess US    PT Home Exercise Plan  chin tuck, upper trap stretch, table slides flexion/ abduction, scapular retraction,lower trunk rotation, hamstring stretch, supine marching,     Consulted and Agree with Plan of Care  Patient  Patient will benefit from skilled therapeutic intervention in order to improve the following deficits and impairments:     Visit Diagnosis: Chronic right shoulder pain  Stiffness of right shoulder, not elsewhere classified  Abnormal posture  Bilateral low back pain, unspecified chronicity, with sciatica presence unspecified  Muscle spasm of back     Problem List Patient Active Problem List   Diagnosis Date Noted  . MCI (mild cognitive impairment) 10/23/2016  . Chronic cough 10/19/2016  . Moderate single current episode  of major depressive disorder (HCC) 12/20/2015  . OSA on CPAP 03/31/2015  . Other seasonal allergic rhinitis 03/31/2015  . 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 Muscle tension headache 10/06/2014  . Depression (emotion) 10/06/2014  . Dementia arising in the senium and presenium 10/06/2014  . Hypersomnia with sleep apnea 10/06/2014  . Neuropathy 07/08/2012  . Back pain 07/08/2012  . HYPERCHOLESTEROLEMIA 04/06/2010  . OVERWEIGHT 04/05/2010  . SKIN RASH 03/08/2010  . VERTIGO 05/06/2009  . CONJUNCTIVITIS, ALLERGIC 02/03/2009  . DYSPNEA ON EXERTION 11/03/2008  . SLEEP APNEA, OBSTRUCTIVE, MILD 07/28/2008  . VITAMIN D DEFICIENCY 06/25/2008  . FATIGUE 06/23/2008  . EDEMA 06/23/2008  . ALLERGIC RHINITIS 05/10/2008  . SHOULDER PAIN, RIGHT 04/22/2008  . NECK PAIN 10/27/2007  . REACTIVE AIRWAY DISEASE 10/01/2007  . PELVIC  PAIN 10/01/2007  . 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 POSTMENOPAUSAL STATUS 08/18/2007  . SCOLIOSIS, LUMBAR SPINE 08/18/2007  . VARICOSE VEINS, LOWER EXTREMITIES 07/28/2007  . GERD 07/28/2007  . 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 LUMBAGO 07/28/2007  . POSITIVE PPD 05/19/2007    Latiesha Harada PTA 12/24/2016, 3:43 PM  [facility] [address] [place], [place], [address] Phone: [phone]   Fax:  [phone]  Name: Kayla Davies MRN: [id number] Date of Birth: 10/14/1955

## 2016-12-27 ENCOUNTER — Encounter: Payer: Medicaid Other | Admitting: Physical Therapy

## 2016-12-31 ENCOUNTER — Ambulatory Visit: Payer: Medicaid Other | Attending: Internal Medicine

## 2016-12-31 ENCOUNTER — Ambulatory Visit: Payer: Medicaid Other | Admitting: Physical Therapy

## 2016-12-31 DIAGNOSIS — M545 Low back pain: Secondary | ICD-10-CM

## 2016-12-31 DIAGNOSIS — G8929 Other chronic pain: Secondary | ICD-10-CM | POA: Diagnosis present

## 2016-12-31 DIAGNOSIS — M25611 Stiffness of right shoulder, not elsewhere classified: Secondary | ICD-10-CM | POA: Insufficient documentation

## 2016-12-31 DIAGNOSIS — M62830 Muscle spasm of back: Secondary | ICD-10-CM | POA: Insufficient documentation

## 2016-12-31 DIAGNOSIS — R293 Abnormal posture: Secondary | ICD-10-CM | POA: Diagnosis present

## 2016-12-31 DIAGNOSIS — M25511 Pain in right shoulder: Secondary | ICD-10-CM | POA: Insufficient documentation

## 2016-12-31 NOTE — Therapy (Signed)
[facility] [address] [place], [place], [address] Phone: [phone]   Fax:  [phone]  Physical Therapy Treatment  Patient Details  Name: Kayla Davies MRN: [id number] Date of Birth: 04/24/1955 Referring Provider: Willey BladeEric Dean MD   Encounter Date: 12/31/2016  PT End of Session - 12/31/16 1033    Visit Number  7    Number of Visits  11    Date for PT Re-Evaluation  01/13/17    Authorization Type  MCD    Authorization Time Period  ends 01/13/17    Authorization - Visit Number  2    Authorization - Number of Visits  6    PT Start Time  1030    PT Stop Time  1140    PT Time Calculation (min)  70 min    Activity Tolerance  Patient tolerated treatment well    Behavior During Therapy  [facility] for tasks assessed/performed       Past Medical History:  Diagnosis Date  . Allergic rhinitis, cause unspecified   . Arthropathy, unspecified, other specified sites   . Asymptomatic varicose veins   . Bladder incontinence   . Bowel incontinence   . Bronchitis   . Cervicalgia   . Edema   . Esophageal reflux   . Lumbago   . Obstructive sleep apnea (adult) (pediatric)   . Other malaise and fatigue   . Pain in joint, shoulder region   . Radiculopathy of lumbar region   . Reactive airway disease   . Scoliosis (and kyphoscoliosis), idiopathic   . 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 Unspecified asthma(493.90)   . Unspecified disorder of lipoid metabolism   . Unspecified vitamin D deficiency   . Vertigo     Past Surgical History:  Procedure Laterality Date  . NECK SURGERY      There were no vitals filed for this visit.  Subjective Assessment - 12/31/16 1102    Subjective  She reports pain is better    Patient is accompained by:  Interpreter phone    Currently in Pain?  Yes    Pain Score  7     Pain Location  Shoulder    Pain Orientation  Right    Pain Descriptors / Indicators  Aching;Sharp    Pain Type  Chronic pain    Pain Onset   More than a month ago    Pain Frequency  Constant    Aggravating Factors   using arm    Pain Relieving Factors  meds, rest    Pain Score  7    Pain Location  Back    Pain Orientation  Lower    Pain Descriptors / Indicators  Burning    Pain Onset  More than a month ago    Pain Frequency  Constant    Aggravating Factors   stand,walk    Pain Relieving Factors  meds         [facility] PT Assessment - 12/31/16 0001      AROM   Right Shoulder Flexion  85 Degrees    Right Shoulder ABduction  90 Degrees    Lumbar Flexion  25    Lumbar Extension  5    Lumbar - Right Side Bend  5    Lumbar - Left Side Bend  5                  [facility] Adult PT Treatment/Exercise - 12/31/16 0001  Neck Exercises: Seated   Neck Retraction Limitations  5 X    Other Seated Exercise  scapular retraction x 10 with postural cueing needed      Lumbar Exercises: Stretches   Active Hamstring Stretch  -- 10 reps sit and reach      Lumbar Exercises: Seated   Long Arc Quad on Chair  20 reps RT and LT    Other Seated Lumbar Exercises  flexion stretching x 3       Shoulder Exercises: Seated   Other Seated Exercises  UE Ranger x10 reach forward, then 15 reps IR/ER then assisted reaching overhead to full ROM x 3 .       Shoulder Exercises: ROM/Strengthening   Other ROM/Strengthening Exercises  retraction bilatera x 10 tactile cues      Shoulder Exercises: Isometric Strengthening   Extension  5X5"    External Rotation  5X5"    Internal Rotation  5X5"    ABduction  5X5"      Moist Heat Therapy   Number Minutes Moist Heat  15 Minutes    Moist Heat Location  Lumbar Spine;Shoulder      Manual Therapy   Manual therapy comments  soft tissue work right shoulder and neck    Passive ROM  right shoulder  full range with pain and lower back fleon in sitting     Manual Traction  cervical spine.                PT Short Term Goals - 12/24/16 1541      PT SHORT TERM GOAL #1   Title  pt to be I  with inital HEP     Baseline  needs cues    Time  3    Period  Weeks    Status  On-going      PT SHORT TERM GOAL #2   Title  verbalize and demo proper posture to prevent and reduce low back, neck and shoulder pain     Baseline  continued cues for proper form    Time  3    Period  Weeks    Status  On-going        PT Long Term Goals - 12/13/16 1044      PT LONG TERM GOAL #1   Title  pt to increase R shoulder AROM to >/= 60 degrees with flexion/ abduction to promtoe functional reaching required for ADLs     Baseline  shoulder flexion 35 degrees, abduction 48 degrees with 7/10 pain    Time  3    Period  Weeks    Status  On-going      PT LONG TERM GOAL #2   Title  increase R shoulder strength to >/= 3+/5 with </= 4/10 pain for functional lifting and carrying activities     Baseline  3-/5 in all planes    Time  3    Period  Weeks    Status  On-going      PT LONG TERM GOAL #3   Title  increase trunk mobility by >/= 10 degrees in all planes to promote functional trunk mobility     Baseline  flexion 10, extension 3 degrees , bil side bending 3 degrees    Time  3    Period  Weeks    Status  On-going            Plan - 12/31/16 1024    Clinical Impression Statement  She was late  for appointment and was moved to later time.  Had to wait 10 min for phone interpreter. She demo some inc ROM of RT shouder and lower back. The treatment is very limited due to language barrier and use of phone interpreter.  It is not totally clear if she unserstands question or request for movement.   She reported during treatment that pain inceased with all tasks and retiurned to pre activity level.   She appears to like the manual contact but pain did not decrease    PT Treatment/Interventions  ADLs/Self Care Home Management;Electrical Stimulation;Cryotherapy;Iontophoresis 4mg /ml Dexamethasone;Moist Heat;Ultrasound;Therapeutic exercise;Therapeutic activities;Dry needling;Taping;Manual  techniques;Neuromuscular re-education;Passive range of motion;Patient/family education    PT Next Visit Plan  continue pain relief and HEP education for Rt shoulder, neck and low back, STM, modali  assess [place]    PT Home Exercise Plan  chin tuck, upper trap stretch, table slides flexion/ abduction, scapular retraction,lower trunk rotation, hamstring stretch, supine marching,     Consulted and Agree with Plan of Care  Patient       Patient will benefit from skilled therapeutic intervention in order to improve the following deficits and impairments:  Pain, Postural dysfunction, Improper body mechanics, Decreased mobility, Decreased strength, Decreased range of motion, Decreased endurance, Increased fascial restricitons, Increased muscle spasms, Decreased activity tolerance, Impaired UE functional use  Visit Diagnosis: Chronic right shoulder pain  Stiffness of right shoulder, not elsewhere classified  Abnormal posture  Bilateral low back pain, unspecified chronicity, with sciatica presence unspecified  Muscle spasm of back     Problem List Patient Active Problem List   Diagnosis Date Noted  . MCI (mild cognitive impairment) 10/23/2016  . Chronic cough 10/19/2016  . Moderate single current episode of major depressive disorder (HCC) 12/20/2015  . OSA on CPAP 03/31/2015  . Other seasonal allergic rhinitis 03/31/2015  . 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 Muscle tension headache 10/06/2014  . Depression (emotion) 10/06/2014  . Dementia arising in the senium and presenium 10/06/2014  . Hypersomnia with sleep apnea 10/06/2014  . Neuropathy 07/08/2012  . Back pain 07/08/2012  . HYPERCHOLESTEROLEMIA 04/06/2010  . OVERWEIGHT 04/05/2010  . SKIN RASH 03/08/2010  . VERTIGO 05/06/2009  . CONJUNCTIVITIS, ALLERGIC 02/03/2009  . DYSPNEA ON EXERTION 11/03/2008  . SLEEP APNEA, OBSTRUCTIVE, MILD 07/28/2008  . VITAMIN D DEFICIENCY 06/25/2008  . FATIGUE 06/23/2008  . EDEMA 06/23/2008  .  ALLERGIC RHINITIS 05/10/2008  . SHOULDER PAIN, RIGHT 04/22/2008  . NECK PAIN 10/27/2007  . REACTIVE AIRWAY DISEASE 10/01/2007  . PELVIC  PAIN 10/01/2007  . 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 POSTMENOPAUSAL STATUS 08/18/2007  . SCOLIOSIS, LUMBAR SPINE 08/18/2007  . VARICOSE VEINS, LOWER EXTREMITIES 07/28/2007  . GERD 07/28/2007  . 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 LUMBAGO 07/28/2007  . POSITIVE PPD 05/19/2007    Caprice Red  PT 12/31/2016, 11:38 AM  [facility] [address] [place], [place], [address] Phone: [phone]   Fax:  [phone]  Name: Kayla Davies MRN: [id number] Date of Birth: 10/16/1955

## 2017-01-04 ENCOUNTER — Encounter: Payer: Self-pay | Admitting: Pulmonary Disease

## 2017-01-04 ENCOUNTER — Ambulatory Visit: Payer: Medicaid Other | Admitting: Pulmonary Disease

## 2017-01-04 DIAGNOSIS — R05 Cough: Secondary | ICD-10-CM | POA: Diagnosis not present

## 2017-01-04 DIAGNOSIS — R053 Chronic cough: Secondary | ICD-10-CM

## 2017-01-04 MED ORDER — BUDESONIDE 180 MCG/ACT IN AEPB: 1.0000 | INHALATION_SPRAY | Freq: Every day | RESPIRATORY_TRACT | 0 refills | Status: DC

## 2017-01-04 NOTE — Addendum Note (Signed)
Addended by: Maurene CapesPOTTS, Twilla Khouri M on: 01/04/2017 11:23 AM   Modules accepted: Orders

## 2017-01-04 NOTE — Progress Notes (Signed)
   Subjective:    Patient ID: Kayla Davies, female    DOB: 11/04/1955, 61 y.o.   MRN: [id number]  HPI  61 yo never smoker , [name]  , followed for chronic cough for 6 years  . Seen by consult 10/19/16 .  Seen previously in 2012 with Dr. Delford FieldWright  For UAC d/t GERD .   Accompanied by interpretor.  She was seen by gastroenterology around 6 months ago and underwent a endoscopy that showed gastritis. Was started on Dexilant  She continues to complain of chronic cough worse during winter and wakes her up from sleep.  Medications have really not helped her much.  She does have some breakthrough heartburn.  Denies wheezing or obvious sinus drip. All her testing below has been negative so far   Significant tests/ events reviewed  Allergy profile was negative with IgE at <2.  QuantiFERON gold was negative .  Chest x-ray showed some mild interstitial markings.  11/2016 HRCT chest >>nml lung parenchyma FENO  >> unable to perform  Review of Systems Patient denies significant dyspnea,cough, hemoptysis,  chest pain, palpitations, pedal edema, orthopnea, paroxysmal nocturnal dyspnea, lightheadedness, nausea, vomiting, abdominal or  leg pains      Objective:   Physical Exam   Gen. Pleasant, thin woman, in no distress ENT - no thrush, no post nasal drip Neck: No JVD, no thyromegaly, no carotid bruits Lungs: no use of accessory muscles, no dullness to percussion, clear without rales or rhonchi  Cardiovascular: Rhythm regular, heart sounds  normal, no murmurs or gallops, no peripheral edema Musculoskeletal: No deformities, no cyanosis or clubbing         Assessment & Plan:

## 2017-01-04 NOTE — Assessment & Plan Note (Signed)
Cough could still be related sinus drip or reflux. Stay on Dexilant for reflux. Stay on Zyrtec  Trial of Pulmicort 1 puff daily-rinse mouth after use -unable to perform FENO  maneuver

## 2017-01-04 NOTE — Patient Instructions (Signed)
Cough could still be related sinus drip or reflux. Stay on Dexilant for reflux. Stay on Zyrtec  Trial of Pulmicort 1 puff daily-rinse mouth after use

## 2017-01-07 ENCOUNTER — Ambulatory Visit: Payer: Medicaid Other | Admitting: Physical Therapy

## 2017-01-09 ENCOUNTER — Ambulatory Visit: Payer: Medicaid Other | Admitting: Physical Therapy

## 2017-01-09 ENCOUNTER — Encounter: Payer: Self-pay | Admitting: *Deleted

## 2017-01-10 ENCOUNTER — Ambulatory Visit: Payer: Medicaid Other | Admitting: Physical Therapy

## 2017-01-10 ENCOUNTER — Ambulatory Visit: Payer: Medicaid Other

## 2017-01-14 ENCOUNTER — Ambulatory Visit: Payer: Medicaid Other | Admitting: Cardiology

## 2017-01-14 ENCOUNTER — Encounter: Payer: Self-pay | Admitting: Cardiology

## 2017-01-14 ENCOUNTER — Ambulatory Visit: Payer: Medicaid Other

## 2017-01-14 ENCOUNTER — Encounter (INDEPENDENT_AMBULATORY_CARE_PROVIDER_SITE_OTHER): Payer: Self-pay

## 2017-01-14 VITALS — BP 132/74 | HR 76 | Ht 65.0 in | Wt 147.0 lb

## 2017-01-14 DIAGNOSIS — R0609 Other forms of dyspnea: Secondary | ICD-10-CM | POA: Diagnosis not present

## 2017-01-14 DIAGNOSIS — R072 Precordial pain: Secondary | ICD-10-CM

## 2017-01-14 DIAGNOSIS — E7849 Other hyperlipidemia: Secondary | ICD-10-CM

## 2017-01-14 DIAGNOSIS — E782 Mixed hyperlipidemia: Secondary | ICD-10-CM | POA: Diagnosis not present

## 2017-01-14 DIAGNOSIS — R0600 Dyspnea, unspecified: Secondary | ICD-10-CM

## 2017-01-14 MED ORDER — METOPROLOL TARTRATE 50 MG PO TABS: 50.0000 mg | ORAL_TABLET | Freq: Once | ORAL | 0 refills | Status: DC

## 2017-01-14 NOTE — Progress Notes (Signed)
Patient Name: Kayla Davies Date of Encounter: 01/14/2017  Primary Care Provider:  Gwenyth Benderean, Eric L, MD Primary Cardiologist:  Dr Delton SeeNelson, new patient   Problem List   Past Medical History:  Diagnosis Date  . Allergic rhinitis, cause unspecified   . Alzheimer's dementia   . Arthropathy, unspecified, other specified sites   . Asymptomatic varicose veins   . Bladder incontinence   . Bowel incontinence   . Bronchitis   . Cervicalgia   . Edema   . Esophageal reflux   . Lumbago   . Obstructive sleep apnea (adult) (pediatric)   . Other malaise and fatigue   . Pain in joint, shoulder region   . Radiculopathy of lumbar region   . Reactive airway disease   . Scoliosis (and kyphoscoliosis), idiopathic   . 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 Unspecified asthma(493.90)   . Unspecified disorder of lipoid metabolism   . Unspecified vitamin D deficiency   . Vertigo    Past Surgical History:  Procedure Laterality Date  . NECK SURGERY      Allergies  No Known Allergies  HPI  Very pleasant 61 year old female, [name], communicating with the help of interpreter who is coming for concerns of chest pain. She has been experiencing exertional chest pains that feel like burning in her mid epigastrium and radiating to her back, they happen with housework, and have been happening more frequently, but rarely happen at rest. She denies any palpitations or syncope. No lower extremity edema no orthopnea paroxysmal nocturnal dyspnea or claudications. She has never smoked and has no family members with premature coronary artery disease. She has significant hyperlipidemia with LDL of 177 in the past that improved with diet modification however her LDL is still 117 and triglycerides 178. HDL 54. She takes no medications. She is being followed by pulmonary for chronic cough, she underwent chest CT last months with no significant abnormalities.   Home Medications  Prior to Admission  medications   Medication Sig Start Date End Date Taking? Authorizing Provider  albuterol (PROVENTIL HFA) 108 (90 BASE) MCG/ACT inhaler Inhale 2 puffs into the lungs every 4 (four) hours as needed. For cough or SOB   Yes [provider]  benzonatate (TESSALON) 200 MG capsule Take 1 capsule (200 mg total) by mouth 3 (three) times daily as needed for cough. 11/16/16  Yes Parrett, Tammy S, NP  budesonide (PULMICORT FLEXHALER) 180 MCG/ACT inhaler Inhale 1 puff into the lungs daily. 01/04/17  Yes Oretha MilchAlva, Rakesh V, MD  cetirizine (ZYRTEC) 10 MG tablet Take 1 tablet (10 mg total) by mouth daily as needed for allergies. 06/16/12  Yes Moreno-Coll, Adlih, MD  DEXILANT 60 MG capsule TK 1 C PO QD 07/29/14  Yes [provider]  meloxicam (MOBIC) 7.5 MG tablet 1-2 tabs by mouth daily with food as needed for pain. Do not take ibuprofen with this medicine. 06/24/12  Yes Acey LavWood, Allison L, MD  methocarbamol (ROBAXIN) 500 MG tablet Take 1 tablet (500 mg total) by mouth 4 (four) times daily. 10/24/16  Yes Renne CriglerGeiple, Joshua, PA-C    Family History  No family history on file.  Social History  Social History   Socioeconomic History  . Marital status: Single    Spouse name: Not on file  . Number of children: 3  . Years of education: 0  . Highest education level: Not on file  Social Needs  . Financial resource strain: Not on file  . Food insecurity -  worry: Not on file  . Food insecurity - inability: Not on file  . Transportation needs - medical: Not on file  . Transportation needs - non-medical: Not on file  Occupational History  . Occupation: unemployed  Tobacco Use  . Smoking status: Never Smoker  . Smokeless tobacco: Never Used  Substance and Sexual Activity  . Alcohol use: No  . Drug use: No  . Sexual activity: No  Other Topics Concern  . Not on file  Social History Narrative   Lives at home with daughter     Review of Systems, as per HPI, otherwise negative General:  No chills,  fever, night sweats or weight changes.  Cardiovascular:  No chest pain, dyspnea on exertion, edema, orthopnea, palpitations, paroxysmal nocturnal dyspnea. Dermatological: No rash, lesions/masses Respiratory: No cough, dyspnea Urologic: No hematuria, dysuria Abdominal:   No nausea, vomiting, diarrhea, bright red blood per rectum, melena, or hematemesis Neurologic:  No visual changes, wkns, changes in mental status. All other systems reviewed and are otherwise negative except as noted above.  Physical Exam  Blood pressure 132/74, pulse 76, height 5\' 5"  (1.651 m), weight 147 lb (66.7 kg), SpO2 98 %.  General: Pleasant, NAD Psych: Normal affect. Neuro: Alert and oriented X 3. Moves all extremities spontaneously. HEENT: Normal  Neck: Supple without bruits or JVD. Lungs:  Resp regular and unlabored, CTA. Heart: RRR no s3, s4, or murmurs. Abdomen: Soft, non-tender, non-distended, BS + x 4.  Extremities: No clubbing, cyanosis or edema. DP/PT/Radials 2+ and equal bilaterally.  Labs:  No results for input(s): CKTOTAL, CKMB, TROPONINI in the last 72 hours. Lab Results  Component Value Date   WBC 3.9 (L) 10/19/2016   HGB 13.4 10/19/2016   HCT 40.3 10/19/2016   MCV 89.0 10/19/2016   PLT 255.0 10/19/2016    No results found for: DDIMER Invalid input(s): POCBNP    Component Value Date/Time   NA 138 10/19/2016 1321   K 3.8 10/19/2016 1321   CL 101 10/19/2016 1321   CO2 31 10/19/2016 1321   GLUCOSE 85 10/19/2016 1321   BUN 14 10/19/2016 1321   CREATININE 0.66 10/19/2016 1321   CALCIUM 9.9 10/19/2016 1321   PROT 7.2 12/18/2011 1500   ALBUMIN 3.9 12/18/2011 1500   AST 18 12/18/2011 1500   ALT 13 12/18/2011 1500   ALKPHOS 66 12/18/2011 1500   BILITOT 0.2 (L) 12/18/2011 1500   GFRNONAA >90 12/18/2011 1500   GFRAA >90 12/18/2011 1500   Lab Results  Component Value Date   CHOL 252 (H) 07/08/2012   HDL 44 07/08/2012   LDLCALC 168 (H) 07/08/2012   TRIG 200 (H) 07/08/2012     Accessory Clinical Findings  Echocardiogram - none   ECG - normal sinus rhythm with anterolateral infarct age undetermined. This is unchanged from prior EKG in 2009.    Assessment & Plan  1.  Chest pain - sounds very atypical and concerning for coronary artery disease especially in the settings of untreated hyperlipidemia, we'll plan for calcium score and coronary CTA to further evaluate her burden with coronary artery disease, based on results we will tailor therapy.  2. Hyperlipidemia - elevated LDL and triglycerides, we'll decide on management based on results of coronary CTA.   Tobias AlexanderKatarina Laiklynn Raczynski, MD, [facility] 01/14/2017, 9:17 AM

## 2017-01-14 NOTE — Patient Instructions (Signed)
Medication Instructions:   Your physician recommends that you continue on your current medications as directed. Please refer to the Current Medication list given to you today.    Testing/Procedures: CORONARY CT WITH FFR  Please [facility] the [facility] of [facility] at xx:xx AM (30-45 minutes prior to test start time)  [facility]  [address] [place], [place] [address] [phone]  Proceed to the [facility] Radiology Department (First Floor).  Please follow these instructions carefully (unless otherwise directed):    On the Night Before the Test: . Drink plenty of water. . Do not consume any caffeinated/decaffeinated beverages or chocolate 12 hours prior to your test. . Do not take any antihistamines 12 hours prior to your test.   On the Day of the Test: . Drink plenty of water. Do not drink any water within one hour of the test. . Do not eat any food 4 hours prior to the test. . You may take your regular medications prior to the test. . IF NOT ON A BETA BLOCKER - Take 50 mg of lopressor (metoprolol) one hour before the test.   After the Test: . Drink plenty of water. . After receiving IV contrast, you may experience a mild flushed feeling. This is normal. . On occasion, you may experience a mild rash up to 24 hours after the test. This is not dangerous. If this occurs, you can take Benadryl 25 mg and increase your fluid intake. . If you experience trouble breathing, this can be serious. If it is severe call 911 IMMEDIATELY. If it is mild, please call our office.    Follow-Up:  2 MONTHS WITH DR Delton SeeNELSON    If you need a refill on your cardiac medications before your next appointment, please call your pharmacy.

## 2017-01-18 ENCOUNTER — Encounter: Payer: Self-pay | Admitting: Physical Therapy

## 2017-01-18 ENCOUNTER — Ambulatory Visit: Payer: Medicaid Other | Admitting: Physical Therapy

## 2017-01-18 DIAGNOSIS — R293 Abnormal posture: Secondary | ICD-10-CM

## 2017-01-18 DIAGNOSIS — G8929 Other chronic pain: Secondary | ICD-10-CM

## 2017-01-18 DIAGNOSIS — M25511 Pain in right shoulder: Secondary | ICD-10-CM | POA: Diagnosis not present

## 2017-01-18 DIAGNOSIS — M62830 Muscle spasm of back: Secondary | ICD-10-CM

## 2017-01-18 DIAGNOSIS — M545 Low back pain: Secondary | ICD-10-CM

## 2017-01-18 DIAGNOSIS — M25611 Stiffness of right shoulder, not elsewhere classified: Secondary | ICD-10-CM

## 2017-01-18 NOTE — Therapy (Signed)
[place], [place], [address] Phone: [phone]   Fax:  [phone]  Physical Therapy Treatment  Patient Details  Name: Kayla Davies MRN: [id number] Date of Birth: [birth year] Referring Provider: Kevan Ny MD   Encounter Date: 01/18/2017  PT End of Session - 01/18/17 1051    Visit Number  8    Number of Visits  11    Date for PT Re-Evaluation  01/28/17    Authorization Type  MCD    Authorization Time Period  01/27/17    Authorization - Visit Number  3    Authorization - Number of Visits  6    PT Start Time  1020    PT Stop Time  1109    PT Time Calculation (min)  49 min    Activity Tolerance  Patient tolerated treatment well    Behavior During Therapy  [facility] for tasks assessed/performed       Past Medical History:  Diagnosis Date  . Allergic rhinitis, cause unspecified   . Alzheimer's dementia   . Arthropathy, unspecified, other specified sites   . Asymptomatic varicose veins   . Bladder incontinence   . Bowel incontinence   . Bronchitis   . Cervicalgia   . Edema   . Esophageal reflux   . Lumbago   . Obstructive sleep apnea (adult) (pediatric)   . Other malaise and fatigue   . Pain in joint, shoulder region   . Radiculopathy of lumbar region   . Reactive airway disease   . Scoliosis (and kyphoscoliosis), idiopathic   . 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 Unspecified asthma(493.90)   . Unspecified disorder of lipoid metabolism   . Unspecified vitamin D deficiency   . Vertigo     Past Surgical History:  Procedure Laterality Date  . NECK SURGERY      There were no vitals filed for this visit.  Subjective Assessment - 01/18/17 1027    Subjective  She has pain today in her back and in her Rt. UE.     Currently in Pain?  Yes    Pain Score  6     Pain Location  Back    Pain Orientation  Right    Pain Descriptors / Indicators  Aching    Pain Type  Chronic pain    Pain Onset  More than a  month ago    Pain Frequency  Constant    Aggravating Factors   walking, bending     Pain Relieving Factors  meds, rest, stretching     Pain Score  6    Pain Location  Arm    Pain Orientation  Right    Pain Type  Chronic pain    Pain Onset  More than a month ago    Pain Frequency  Constant    Aggravating Factors   reaching and lifting     Pain Relieving Factors  meds, rest, heat          [facility] PT Assessment - 01/18/17 0001      AROM   Right Shoulder Flexion  80 Degrees    Lumbar Flexion  30    Lumbar Extension  10      Strength   Right Shoulder Flexion  4-/5    Right Shoulder Internal Rotation  3+/5    Right Shoulder External Rotation  3+/5  [place] Adult PT Treatment/Exercise - 01/18/17 0001      Lumbar Exercises: Stretches   Single Knee to Chest Stretch  2 reps;30 seconds    Lower Trunk Rotation  10 seconds x 10       Lumbar Exercises: Sidelying   Clam  10 reps    Hip Abduction  10 reps    Other Sidelying Lumbar Exercises  upper trunk rotation x 5 each side       Shoulder Exercises: Supine   Horizontal ABduction  Strengthening;Both;10 reps    Theraband Level (Shoulder Horizontal ABduction)  Level 1 (Yellow)    External Rotation  Strengthening;Both;10 reps;Theraband    Theraband Level (Shoulder External Rotation)  Level 1 (Yellow)    Other Supine Exercises  scapualr retraction x 10       Shoulder Exercises: Seated   Flexion  AAROM;Both;10 reps    Abduction  AAROM;Right;10 reps      Moist Heat Therapy   Number Minutes Moist Heat  10 Minutes    Moist Heat Location  Shoulder;Lumbar Spine             PT Education - 01/18/17 1105    Education provided  Yes    Education Details  strength, flexion Rt. UE     Person(s) Educated  Patient    Methods  Explanation    Comprehension  Verbalized understanding       PT Short Term Goals - 01/18/17 1045      PT SHORT TERM GOAL #1   Title  pt to be I with inital HEP     Baseline  min  cues     Status  Partially Met      PT SHORT TERM GOAL #2   Title  verbalize and demo proper posture to prevent and reduce low back, neck and shoulder pain     Status  On-going        PT Long Term Goals - 01/18/17 1046      PT LONG TERM GOAL #1   Title  pt to increase R shoulder AROM to >/= 60 degrees with flexion/ abduction to promtoe functional reaching required for ADLs     Status  Achieved      PT LONG TERM GOAL #2   Title  increase R shoulder strength to >/= 3+/5 with </= 4/10 pain for functional lifting and carrying activities     Baseline  3+/5 to 4/5     Status  Partially Met      PT LONG TERM GOAL #3   Title  increase trunk mobility by >/= 10 degrees in all planes to promote functional trunk mobility             Plan - 01/18/17 1032    Clinical Impression Statement  Patient is very weak in UEs , but has improved since eval.  She is limited in all planes of motion., flexion in particular.  She was re-issued HEP as she has needed mod cues for technique. Will finish number of visits authorized, 3 more visits after today.     PT Next Visit Plan  continue pain relief and HEP education for Rt shoulder, neck and low back, STM, modali  assess [place]    PT Home Exercise Plan  chin tuck, upper trap stretch, table slides flexion/ abduction, scapular retraction ER/IR unattached yellow ,lower trunk rotation, hamstring stretch, supine marching,     Consulted and Agree with Plan of Care  Patient  Patient will benefit from skilled therapeutic intervention in order to improve the following deficits and impairments:  Pain, Postural dysfunction, Improper body mechanics, Decreased mobility, Decreased strength, Decreased range of motion, Decreased endurance, Increased fascial restricitons, Increased muscle spasms, Decreased activity tolerance, Impaired UE functional use  Visit Diagnosis: Chronic right shoulder pain  Stiffness of right shoulder, not elsewhere classified  Abnormal  posture  Bilateral low back pain, unspecified chronicity, with sciatica presence unspecified  Muscle spasm of back     Problem List Patient Active Problem List   Diagnosis Date Noted  . MCI (mild cognitive impairment) 10/23/2016  . Chronic cough 10/19/2016  . Moderate single current episode of major depressive disorder ([place]) 12/20/2015  . Other seasonal allergic rhinitis 03/31/2015  . 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 Muscle tension headache 10/06/2014  . Depression (emotion) 10/06/2014  . Dementia arising in the senium and presenium 10/06/2014  . Hypersomnia with sleep apnea 10/06/2014  . Neuropathy 07/08/2012  . Back pain 07/08/2012  . HYPERCHOLESTEROLEMIA 04/06/2010  . OVERWEIGHT 04/05/2010  . SKIN RASH 03/08/2010  . VERTIGO 05/06/2009  . CONJUNCTIVITIS, ALLERGIC 02/03/2009  . DYSPNEA ON EXERTION 11/03/2008  . SLEEP APNEA, OBSTRUCTIVE, MILD 07/28/2008  . VITAMIN D DEFICIENCY 06/25/2008  . FATIGUE 06/23/2008  . EDEMA 06/23/2008  . ALLERGIC RHINITIS 05/10/2008  . SHOULDER PAIN, RIGHT 04/22/2008  . NECK PAIN 10/27/2007  . REACTIVE AIRWAY DISEASE 10/01/2007  . PELVIC  PAIN 10/01/2007  . 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 POSTMENOPAUSAL STATUS 08/18/2007  . SCOLIOSIS, LUMBAR SPINE 08/18/2007  . VARICOSE VEINS, LOWER EXTREMITIES 07/28/2007  . GERD 07/28/2007  . 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 LUMBAGO 07/28/2007  . POSITIVE PPD 05/19/2007    Antar Milks 01/18/2017, 11:16 AM  [facility] [address] [place], [place], [address] Phone: [phone]   Fax:  [phone]  Name: Kayla Davies MRN: [id number] Date of Birth: Sep 03, 1955  Raeford Razor, PT 01/18/17 11:16 AM Phone: [phone] Fax: [phone]

## 2017-01-18 NOTE — Patient Instructions (Signed)
Resisted External Rotation: in Neutral - Bilateral    Sit or stand, tubing in both hands, elbows at sides, bent to 90, forearms forward. Pinch shoulder blades together and rotate forearms out. Keep elbows at sides. Repeat _10_ times per set. Do __2__ sets per session. Do _1___ sessions per day.  http://orth.exer.us/967   Copyright  VHI. All rights reserved.

## 2017-01-23 ENCOUNTER — Encounter: Payer: Self-pay | Admitting: Physical Therapy

## 2017-01-23 ENCOUNTER — Ambulatory Visit: Payer: Medicaid Other | Admitting: Physical Therapy

## 2017-01-23 DIAGNOSIS — G8929 Other chronic pain: Secondary | ICD-10-CM

## 2017-01-23 DIAGNOSIS — M25511 Pain in right shoulder: Secondary | ICD-10-CM | POA: Diagnosis not present

## 2017-01-23 DIAGNOSIS — M62830 Muscle spasm of back: Secondary | ICD-10-CM

## 2017-01-23 DIAGNOSIS — M545 Low back pain: Secondary | ICD-10-CM

## 2017-01-23 DIAGNOSIS — M25611 Stiffness of right shoulder, not elsewhere classified: Secondary | ICD-10-CM

## 2017-01-23 DIAGNOSIS — R293 Abnormal posture: Secondary | ICD-10-CM

## 2017-01-23 NOTE — Therapy (Signed)
[place], [place], [address] Phone: [phone]   Fax:  [phone]  Physical Therapy Treatment  Patient Details  Name: Kayla Davies MRN: [id number] Date of Birth: [birth year] Referring Provider: Kevan Ny MD   Encounter Date: 01/23/2017  PT End of Session - 01/23/17 1142    Visit Number  9    Number of Visits  11    Date for PT Re-Evaluation  01/28/17    Authorization Type  MCD    Authorization Time Period  01/27/17    Authorization - Visit Number  4    Authorization - Number of Visits  6    PT Start Time  1110 pt arrived late; hot pack    PT Stop Time  1152    PT Time Calculation (min)  42 min    Activity Tolerance  Patient tolerated treatment well    Behavior During Therapy  [facility] for tasks assessed/performed       Past Medical History:  Diagnosis Date  . Allergic rhinitis, cause unspecified   . Alzheimer's dementia   . Arthropathy, unspecified, other specified sites   . Asymptomatic varicose veins   . Bladder incontinence   . Bowel incontinence   . Bronchitis   . Cervicalgia   . Edema   . Esophageal reflux   . Lumbago   . Obstructive sleep apnea (adult) (pediatric)   . Other malaise and fatigue   . Pain in joint, shoulder region   . Radiculopathy of lumbar region   . Reactive airway disease   . Scoliosis (and kyphoscoliosis), idiopathic   . 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 Unspecified asthma(493.90)   . Unspecified disorder of lipoid metabolism   . Unspecified vitamin D deficiency   . Vertigo     Past Surgical History:  Procedure Laterality Date  . NECK SURGERY      There were no vitals filed for this visit.  Subjective Assessment - 01/23/17 1112    Subjective  has been having pain all week in Rt shoulder and back.  feels better "a little bit"    Patient is accompained by:  Interpreter    How long can you sit comfortably?  15 min (no change from eval)    How long can you stand  comfortably?  15 min (previously 5-10 min)    How long can you walk comfortably?  30 min (previously 5-10 min)    Diagnostic tests  neck/ shoulder/ back and knee x-ray    Patient Stated Goals  to decrease pain    Currently in Pain?  Yes    Pain Score  7     Pain Location  Back    Pain Orientation  Right    Pain Descriptors / Indicators  Aching    Pain Type  Chronic pain    Pain Radiating Towards  neck and arm    Pain Onset  More than a month ago    Pain Frequency  Constant    Aggravating Factors   walking, bending    Pain Relieving Factors  meds, rest, stretching    Pain Score  6    Pain Location  Shoulder    Pain Orientation  Right    Pain Descriptors / Indicators  Burning    Pain Type  Chronic pain    Pain Onset  More than a month ago    Pain Frequency  Constant    Aggravating Factors   reaching and  lifting    Pain Relieving Factors  meds, rest, heat                      [facility] Adult PT Treatment/Exercise - 01/23/17 1116      Neck Exercises: Theraband   Scapula Retraction  15 reps yellow; seated; bil    Shoulder External Rotation  15 reps yellow; sitting; bil      Neck Exercises: Seated   Shoulder Rolls  Backwards;15 reps      Lumbar Exercises: Stretches   Single Knee to Chest Stretch  2 reps;30 seconds    Lower Trunk Rotation  10 seconds x 10       Moist Heat Therapy   Number Minutes Moist Heat  10 Minutes    Moist Heat Location  Shoulder;Lumbar Spine      Manual Therapy   Manual therapy comments  soft tissue work right shoulder and neck; TRP to upper trap and infraspinatus             PT Education - 01/23/17 1142    Education provided  Yes    Education Details  POC, d/c next visit    Person(s) Educated  Patient    Methods  Explanation    Comprehension  Verbalized understanding       PT Short Term Goals - 01/18/17 1045      PT SHORT TERM GOAL #1   Title  pt to be I with inital HEP     Baseline  min cues     Status  Partially Met       PT SHORT TERM GOAL #2   Title  verbalize and demo proper posture to prevent and reduce low back, neck and shoulder pain     Status  On-going        PT Long Term Goals - 01/18/17 1046      PT LONG TERM GOAL #1   Title  pt to increase R shoulder AROM to >/= 60 degrees with flexion/ abduction to promtoe functional reaching required for ADLs     Status  Achieved      PT LONG TERM GOAL #2   Title  increase R shoulder strength to >/= 3+/5 with </= 4/10 pain for functional lifting and carrying activities     Baseline  3+/5 to 4/5     Status  Partially Met      PT LONG TERM GOAL #3   Title  increase trunk mobility by >/= 10 degrees in all planes to promote functional trunk mobility             Plan - 01/23/17 1143    Clinical Impression Statement  Pt with minimal change in pain and function since initial evaluation.  Added additional session this week due to insurance authorization. Plan for d/c next visit.    PT Treatment/Interventions  ADLs/Self Care Home Management;Electrical Stimulation;Cryotherapy;Iontophoresis 28m/ml Dexamethasone;Moist Heat;Ultrasound;Therapeutic exercise;Therapeutic activities;Dry needling;Taping;Manual techniques;Neuromuscular re-education;Passive range of motion;Patient/family education    PT Next Visit Plan  plan for d/c visit; HEP and finish checking goals       Patient will benefit from skilled therapeutic intervention in order to improve the following deficits and impairments:  Pain, Postural dysfunction, Improper body mechanics, Decreased mobility, Decreased strength, Decreased range of motion, Decreased endurance, Increased fascial restricitons, Increased muscle spasms, Decreased activity tolerance, Impaired UE functional use  Visit Diagnosis: Chronic right shoulder pain  Stiffness of right shoulder, not elsewhere classified  Abnormal posture  Bilateral low back pain, unspecified chronicity, with sciatica presence unspecified  Muscle spasm of  back     Problem List Patient Active Problem List   Diagnosis Date Noted  . MCI (mild cognitive impairment) 10/23/2016  . Chronic cough 10/19/2016  . Moderate single current episode of major depressive disorder ([place]) 12/20/2015  . Other seasonal allergic rhinitis 03/31/2015  . 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 Muscle tension headache 10/06/2014  . Depression (emotion) 10/06/2014  . Dementia arising in the senium and presenium 10/06/2014  . Hypersomnia with sleep apnea 10/06/2014  . Neuropathy 07/08/2012  . Back pain 07/08/2012  . HYPERCHOLESTEROLEMIA 04/06/2010  . OVERWEIGHT 04/05/2010  . SKIN RASH 03/08/2010  . VERTIGO 05/06/2009  . CONJUNCTIVITIS, ALLERGIC 02/03/2009  . DYSPNEA ON EXERTION 11/03/2008  . SLEEP APNEA, OBSTRUCTIVE, MILD 07/28/2008  . VITAMIN D DEFICIENCY 06/25/2008  . FATIGUE 06/23/2008  . EDEMA 06/23/2008  . ALLERGIC RHINITIS 05/10/2008  . SHOULDER PAIN, RIGHT 04/22/2008  . NECK PAIN 10/27/2007  . REACTIVE AIRWAY DISEASE 10/01/2007  . PELVIC  PAIN 10/01/2007  . 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 POSTMENOPAUSAL STATUS 08/18/2007  . SCOLIOSIS, LUMBAR SPINE 08/18/2007  . VARICOSE VEINS, LOWER EXTREMITIES 07/28/2007  . GERD 07/28/2007  . 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 LUMBAGO 07/28/2007  . POSITIVE PPD 05/19/2007      Laureen Abrahams, PT, DPT 01/23/17 11:45 AM    [facility] [address] [place], [place], [address] Phone: [phone]   Fax:  [phone]  Name: Kayla Davies MRN: [id number] Date of Birth: 02/07/1955

## 2017-01-25 ENCOUNTER — Encounter: Payer: Self-pay | Admitting: Physical Therapy

## 2017-01-25 ENCOUNTER — Ambulatory Visit: Payer: Medicaid Other | Admitting: Physical Therapy

## 2017-01-25 DIAGNOSIS — M25511 Pain in right shoulder: Principal | ICD-10-CM

## 2017-01-25 DIAGNOSIS — M545 Low back pain: Secondary | ICD-10-CM

## 2017-01-25 DIAGNOSIS — M25611 Stiffness of right shoulder, not elsewhere classified: Secondary | ICD-10-CM

## 2017-01-25 DIAGNOSIS — M62830 Muscle spasm of back: Secondary | ICD-10-CM

## 2017-01-25 DIAGNOSIS — G8929 Other chronic pain: Secondary | ICD-10-CM

## 2017-01-25 DIAGNOSIS — R293 Abnormal posture: Secondary | ICD-10-CM

## 2017-01-25 NOTE — Therapy (Addendum)
[place], [place], [address] Phone: [phone]   Fax:  [phone]  Physical Therapy Treatment / Discharge Summary  Patient Details  Name: Kayla Davies MRN: [id number] Date of Birth: 12/31/1955 Referring Provider: Kevan Ny MD   Encounter Date: 01/25/2017  PT End of Session - 01/25/17 1027    Visit Number  10    Number of Visits  11    Date for PT Re-Evaluation  01/28/17    PT Start Time  6067    PT Stop Time  1113    PT Time Calculation (min)  58 min    Activity Tolerance  Patient tolerated treatment well    Behavior During Therapy  [facility] for tasks assessed/performed       Past Medical History:  Diagnosis Date  . Allergic rhinitis, cause unspecified   . Alzheimer's dementia   . Arthropathy, unspecified, other specified sites   . Asymptomatic varicose veins   . Bladder incontinence   . Bowel incontinence   . Bronchitis   . Cervicalgia   . Edema   . Esophageal reflux   . Lumbago   . Obstructive sleep apnea (adult) (pediatric)   . Other malaise and fatigue   . Pain in joint, shoulder region   . Radiculopathy of lumbar region   . Reactive airway disease   . Scoliosis (and kyphoscoliosis), idiopathic   . 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 Unspecified asthma(493.90)   . Unspecified disorder of lipoid metabolism   . Unspecified vitamin D deficiency   . Vertigo     Past Surgical History:  Procedure Laterality Date  . NECK SURGERY      There were no vitals filed for this visit.  Subjective Assessment - 01/25/17 1014    Subjective  "I do feel better, sometimes I have pain and sometimes it goes away"     Patient is accompained by:  Interpreter    Currently in Pain?  Yes    Pain Score  5     Pain Location  Back    Pain Score  5    Pain Location  Shoulder    Pain Orientation  Right    Pain Onset  More than a month ago         [facility] PT Assessment - 01/25/17 1035      AROM   Overall AROM  Comments  visually pt performed trunk flexion that was more than measure if reaching, but demonstrated limited trunk flexion when ROM was being assessed    Right Shoulder Extension  31 Degrees    Right Shoulder Flexion  82 Degrees    Right Shoulder ABduction  70 Degrees    Right Shoulder Internal Rotation  60 Degrees to stomach in nuetral    Right Shoulder Horizontal ABduction  52 Degrees in neutral pos.    Lumbar Flexion  32    Lumbar Extension  10    Lumbar - Right Side Bend  10    Lumbar - Left Side Bend  10      Strength   Right Shoulder Flexion  4-/5    Right Shoulder Extension  3+/5    Right Shoulder ABduction  3/5    Right Shoulder Internal Rotation  3+/5    Right Shoulder External Rotation  3+/5                  [facility] Adult PT Treatment/Exercise - 01/25/17 1029  Neck Exercises: Seated   Other Seated Exercise  self 1st rib mobs 2 x 10 holding 5 sec      Neck Exercises: Supine   Neck Retraction  10 reps;5 secs with towel underneath top of head      Lumbar Exercises: Stretches   Active Hamstring Stretch  2 reps;30 seconds    Lower Trunk Rotation  10 seconds x 10 ea. way      Lumbar Exercises: Supine   Bent Knee Raise  10 reps      Moist Heat Therapy   Number Minutes Moist Heat  10 Minutes    Moist Heat Location  Shoulder;Lumbar Spine      Manual Therapy   Manual therapy comments  MTPR along  Rupper trap    Joint Mobilization  C3-C6 central P-A, grade 2-3; C3-C6 neutral gapping to right, R 1st rib inferior, grade 2-3              PT Education - 01/25/17 1026    Education provided  Yes    Education Details  updated HEP and reviewed previously provided hEP. Discussed benefits of continuing exercise to promote / maintain current level of function.     Person(s) Educated  Patient;Other (comment) interpreter    Methods  Explanation;Verbal cues;Demonstration;Handout    Comprehension  Verbalized understanding;Verbal cues required;Returned  demonstration;Tactile cues required       PT Short Term Goals - 01/25/17 1041      PT SHORT TERM GOAL #1   Title  pt to be I with inital HEP     Time  3    Period  Weeks    Status  Achieved      PT SHORT TERM GOAL #2   Title  verbalize and demo proper posture to prevent and reduce low back, neck and shoulder pain     Baseline  continued cues for proper form    Time  3    Period  Weeks    Status  Partially Met        PT Long Term Goals - 01/25/17 1042      PT LONG TERM GOAL #1   Title  pt to increase R shoulder AROM to >/= 60 degrees with flexion/ abduction to promtoe functional reaching required for ADLs     Time  3    Period  Weeks    Status  Achieved      PT LONG TERM GOAL #2   Title  increase R shoulder strength to >/= 3+/5 with </= 4/10 pain for functional lifting and carrying activities     Time  3    Period  Weeks    Status  Partially Met      PT LONG TERM GOAL #3   Title  increase trunk mobility by >/= 10 degrees in all planes to promote functional trunk mobility     Time  3    Period  Weeks    Status  Partially Met            Plan - 01/25/17 1052    Clinical Impression Statement  pt demonstrates minimal improvements in shoulder mobility and trunk mobility with report of decreased pain but only by 1-2 points compared to inital measures. She met or partially met all goals. continued soft tissue work and mobs to promote cervical mobility and pain relief. updated HEP for rib mobs and chin tucks which she required multiple verbal cueing for proper form.  she is able  to maintain and progress her current level of function independently and will be discharged from PT today.     PT Next Visit Plan  D/C    PT Home Exercise Plan  chin tuck, upper trap stretch, table slides flexion/ abduction, scapular retraction ER/IR unattached yellow ,lower trunk rotation, hamstring stretch, supine marching, rib mobs, and updated chin tuck with towel     Consulted and Agree with  Plan of Care  Patient;Other (Comment) interpreter       Patient will benefit from skilled therapeutic intervention in order to improve the following deficits and impairments:  Pain, Postural dysfunction, Improper body mechanics, Decreased mobility, Decreased strength, Decreased range of motion, Decreased endurance, Increased fascial restricitons, Increased muscle spasms, Decreased activity tolerance, Impaired UE functional use  Visit Diagnosis: Chronic right shoulder pain  Stiffness of right shoulder, not elsewhere classified  Abnormal posture  Bilateral low back pain, unspecified chronicity, with sciatica presence unspecified  Muscle spasm of back     Problem List Patient Active Problem List   Diagnosis Date Noted  . MCI (mild cognitive impairment) 10/23/2016  . Chronic cough 10/19/2016  . Moderate single current episode of major depressive disorder ([place]) 12/20/2015  . Other seasonal allergic rhinitis 03/31/2015  . 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 Muscle tension headache 10/06/2014  . Depression (emotion) 10/06/2014  . Dementia arising in the senium and presenium 10/06/2014  . Hypersomnia with sleep apnea 10/06/2014  . Neuropathy 07/08/2012  . Back pain 07/08/2012  . HYPERCHOLESTEROLEMIA 04/06/2010  . OVERWEIGHT 04/05/2010  . SKIN RASH 03/08/2010  . VERTIGO 05/06/2009  . CONJUNCTIVITIS, ALLERGIC 02/03/2009  . DYSPNEA ON EXERTION 11/03/2008  . SLEEP APNEA, OBSTRUCTIVE, MILD 07/28/2008  . VITAMIN D DEFICIENCY 06/25/2008  . FATIGUE 06/23/2008  . EDEMA 06/23/2008  . ALLERGIC RHINITIS 05/10/2008  . SHOULDER PAIN, RIGHT 04/22/2008  . NECK PAIN 10/27/2007  . REACTIVE AIRWAY DISEASE 10/01/2007  . PELVIC  PAIN 10/01/2007  . 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 POSTMENOPAUSAL STATUS 08/18/2007  . SCOLIOSIS, LUMBAR SPINE 08/18/2007  . VARICOSE VEINS, LOWER EXTREMITIES 07/28/2007  . GERD 07/28/2007  . 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 LUMBAGO 07/28/2007  . POSITIVE PPD 05/19/2007   Starr Lake PT, DPT, LAT, ATC  01/25/17  11:02 AM      [facility] [address] [place], [place], [address] Phone: [phone]   Fax:  [phone]  Name: MAILYN STEICHEN MRN: [id number] Date of Birth: 1955/10/26      PHYSICAL THERAPY DISCHARGE SUMMARY  Visits from Start of Care: 10  Current functional level related to goals / functional outcomes: See goals   Remaining deficits: Continued intermittent R shoulder, neck and back pain with limited mobility and associated weakness. See above assessment.    Education / Equipment: HEP, theraband, posture,   Plan: Patient agrees to discharge.  Patient goals were partially met. Patient is being discharged due to meeting the stated rehab goals.  ?????       Kristoffer Leamon PT, DPT, LAT, ATC  01/25/17  11:02 AM

## 2017-02-11 ENCOUNTER — Other Ambulatory Visit: Payer: Self-pay | Admitting: Internal Medicine

## 2017-02-11 DIAGNOSIS — Z1231 Encounter for screening mammogram for malignant neoplasm of breast: Secondary | ICD-10-CM

## 2017-02-19 ENCOUNTER — Encounter: Payer: Self-pay | Admitting: Cardiology

## 2017-03-08 ENCOUNTER — Encounter: Payer: Self-pay | Admitting: Physical Therapy

## 2017-03-08 ENCOUNTER — Other Ambulatory Visit: Payer: Self-pay

## 2017-03-08 ENCOUNTER — Ambulatory Visit: Payer: Medicaid Other | Attending: Internal Medicine | Admitting: Physical Therapy

## 2017-03-08 DIAGNOSIS — R293 Abnormal posture: Secondary | ICD-10-CM | POA: Diagnosis present

## 2017-03-08 DIAGNOSIS — M25511 Pain in right shoulder: Secondary | ICD-10-CM | POA: Diagnosis present

## 2017-03-08 DIAGNOSIS — G8929 Other chronic pain: Secondary | ICD-10-CM

## 2017-03-08 DIAGNOSIS — M542 Cervicalgia: Secondary | ICD-10-CM | POA: Insufficient documentation

## 2017-03-08 DIAGNOSIS — M62830 Muscle spasm of back: Secondary | ICD-10-CM | POA: Diagnosis present

## 2017-03-08 DIAGNOSIS — M545 Low back pain: Secondary | ICD-10-CM | POA: Diagnosis present

## 2017-03-08 DIAGNOSIS — M25611 Stiffness of right shoulder, not elsewhere classified: Secondary | ICD-10-CM | POA: Insufficient documentation
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
## 2017-03-15 ENCOUNTER — Encounter: Payer: Self-pay | Admitting: Physical Therapy

## 2017-03-15 ENCOUNTER — Ambulatory Visit: Payer: Medicaid Other | Admitting: Physical Therapy

## 2017-03-15 DIAGNOSIS — R293 Abnormal posture: Secondary | ICD-10-CM

## 2017-03-15 DIAGNOSIS — M62830 Muscle spasm of back: Secondary | ICD-10-CM

## 2017-03-15 DIAGNOSIS — M545 Low back pain: Secondary | ICD-10-CM

## 2017-03-15 DIAGNOSIS — G8929 Other chronic pain: Secondary | ICD-10-CM

## 2017-03-15 DIAGNOSIS — M542 Cervicalgia: Secondary | ICD-10-CM

## 2017-03-15 DIAGNOSIS — M25611 Stiffness of right shoulder, not elsewhere classified: Secondary | ICD-10-CM

## 2017-03-15 DIAGNOSIS — M25511 Pain in right shoulder: Principal | ICD-10-CM

## 2017-03-15 NOTE — Therapy (Signed)
[place], [place], [address] Phone: [phone]   Fax:  [phone]  Physical Therapy Treatment  Patient Details  Name: Kayla Davies MRN: [id number] Date of Birth: 1955/11/15 Referring Provider: Kevan Ny   Encounter Date: 03/15/2017  PT End of Session - 03/15/17 0959    Visit Number  2    Date for PT Re-Evaluation  05/06/17    PT Start Time  0930    PT Stop Time  1018    PT Time Calculation (min)  48 min    Activity Tolerance  Patient limited by pain    Behavior During Therapy  [facility] for tasks assessed/performed       Past Medical History:  Diagnosis Date  . Allergic rhinitis, cause unspecified   . Alzheimer's dementia   . Arthropathy, unspecified, other specified sites   . Asymptomatic varicose veins   . Bladder incontinence   . Bowel incontinence   . Bronchitis   . Cervicalgia   . Edema   . Esophageal reflux   . Lumbago   . Obstructive sleep apnea (adult) (pediatric)   . Other malaise and fatigue   . Pain in joint, shoulder region   . Radiculopathy of lumbar region   . Reactive airway disease   . Scoliosis (and kyphoscoliosis), idiopathic   . 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 Unspecified asthma(493.90)   . Unspecified disorder of lipoid metabolism   . Unspecified vitamin D deficiency   . Vertigo     Past Surgical History:  Procedure Laterality Date  . NECK SURGERY      There were no vitals filed for this visit.  Subjective Assessment - 03/15/17 0943    Subjective  Patient reports pain an 8/10 in the back and neck    Currently in Pain?  Yes    Pain Score  8     Pain Location  Back neck and right shoulder    Pain Descriptors / Indicators  Aching;Sore                      [facility] Adult PT Treatment/Exercise - 03/15/17 0001      Neck Exercises: Machines for Strengthening   Other Machines for Strengthening  Nustep level 3 x 5 minutes      Neck Exercises:  Standing   Lift / Chop  20 reps;Limitations    Left / Chop Limitations  with PT overpressure to get better motions    Other Standing Exercises  3# shrugs and small rolls    Other Standing Exercises  back to wall small ball overhead raise      Lumbar Exercises: Supine   Other Supine Lumbar Exercises  feet on ball K2C, trunk rotation and small bridges, with isometric abdominals      Electrical Stimulation   Electrical Stimulation Location  neck/right shoulder area    Electrical Stimulation Action  IFC    Electrical Stimulation Parameters  supine    Electrical Stimulation Goals  Pain               PT Short Term Goals - 01/25/17 1041      PT SHORT TERM GOAL #1   Title  pt to be I with inital HEP     Time  3    Period  Weeks    Status  Achieved      PT SHORT TERM GOAL #2   Title  verbalize  and demo proper posture to prevent and reduce low back, neck and shoulder pain     Baseline  continued cues for proper form    Time  3    Period  Weeks    Status  Partially Met        PT Long Term Goals - 03/08/17 4765      PT LONG TERM GOAL #1   Title  pt to increase R shoulder AROM to 100 degrees for better ADL's    Time  8    Period  Weeks    Status  New      PT LONG TERM GOAL #2   Title  increase R shoulder strength to 4-/5 without pain > 4/10    Time  8    Period  Weeks    Status  New      PT LONG TERM GOAL #3   Title  increase lumbar ROM 25%    Time  8    Period  Weeks    Status  New      PT LONG TERM GOAL #4   Title  may return to walking for exercise or her gym at her apartment    Time  8    Period  Weeks    Status  New            Plan - 03/15/17 0959    Clinical Impression Statement  Patient is reluctant to exercise, she does well but needs help and appears to be in pain iwth mots activities, I explained via interpreter that moving is better to relax spasms and increase motions    PT Next Visit Plan  will resume PT trying to achieve her prior level  of function and less pain with her ADL's    Consulted and Agree with Plan of Care  Patient       Patient will benefit from skilled therapeutic intervention in order to improve the following deficits and impairments:  Pain, Postural dysfunction, Improper body mechanics, Decreased mobility, Decreased strength, Decreased range of motion, Decreased endurance, Increased fascial restricitons, Increased muscle spasms, Decreased activity tolerance, Impaired UE functional use  Visit Diagnosis: Chronic right shoulder pain  Stiffness of right shoulder, not elsewhere classified  Abnormal posture  Bilateral low back pain, unspecified chronicity, with sciatica presence unspecified  Muscle spasm of back  Cervicalgia     Problem List Patient Active Problem List   Diagnosis Date Noted  . MCI (mild cognitive impairment) 10/23/2016  . Chronic cough 10/19/2016  . Moderate single current episode of major depressive disorder ([place]) 12/20/2015  . Other seasonal allergic rhinitis 03/31/2015  . 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 Muscle tension headache 10/06/2014  . Depression (emotion) 10/06/2014  . Dementia arising in the senium and presenium 10/06/2014  . Hypersomnia with sleep apnea 10/06/2014  . Neuropathy 07/08/2012  . Back pain 07/08/2012  . HYPERCHOLESTEROLEMIA 04/06/2010  . OVERWEIGHT 04/05/2010  . SKIN RASH 03/08/2010  . VERTIGO 05/06/2009  . CONJUNCTIVITIS, ALLERGIC 02/03/2009  . DYSPNEA ON EXERTION 11/03/2008  . SLEEP APNEA, OBSTRUCTIVE, MILD 07/28/2008  . VITAMIN D DEFICIENCY 06/25/2008  . FATIGUE 06/23/2008  . EDEMA 06/23/2008  . ALLERGIC RHINITIS 05/10/2008  . SHOULDER PAIN, RIGHT 04/22/2008  . NECK PAIN 10/27/2007  . REACTIVE AIRWAY DISEASE 10/01/2007  . PELVIC  PAIN 10/01/2007  . 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 POSTMENOPAUSAL STATUS 08/18/2007  . SCOLIOSIS, LUMBAR SPINE 08/18/2007  . VARICOSE VEINS, LOWER EXTREMITIES 07/28/2007  . GERD  07/28/2007  .  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 LUMBAGO 07/28/2007  . POSITIVE PPD 05/19/2007    Sumner Boast., PT 03/15/2017, 10:03 AM  [facility], [place], [address] Phone: [phone]   Fax:  [phone]  Name: Kayla Davies MRN: [id number] Date of Birth: January 31, 1955

## 2017-03-19 ENCOUNTER — Ambulatory Visit: Payer: Medicaid Other | Admitting: Physical Therapy

## 2017-03-19 ENCOUNTER — Encounter: Payer: Self-pay | Admitting: Physical Therapy

## 2017-03-19 DIAGNOSIS — M25611 Stiffness of right shoulder, not elsewhere classified: Secondary | ICD-10-CM

## 2017-03-19 DIAGNOSIS — M25511 Pain in right shoulder: Secondary | ICD-10-CM | POA: Diagnosis not present

## 2017-03-19 DIAGNOSIS — R293 Abnormal posture: Secondary | ICD-10-CM

## 2017-03-19 DIAGNOSIS — G8929 Other chronic pain: Secondary | ICD-10-CM

## 2017-03-19 NOTE — Therapy (Signed)
[place], [place], [address] Phone: [phone]   Fax:  [phone]  Physical Therapy Treatment  Patient Details  Name: Kayla Davies MRN: [id number] Date of Birth: Mar 15, 1955 Referring Provider: Kevan Ny   Encounter Date: 03/19/2017  PT End of Session - 03/19/17 1015    Visit Number  3    Number of Visits  11    Date for PT Re-Evaluation  05/06/17    PT Start Time  0940    PT Stop Time  1941    PT Time Calculation (min)  49 min       Past Medical History:  Diagnosis Date  . Allergic rhinitis, cause unspecified   . Alzheimer's dementia   . Arthropathy, unspecified, other specified sites   . Asymptomatic varicose veins   . Bladder incontinence   . Bowel incontinence   . Bronchitis   . Cervicalgia   . Edema   . Esophageal reflux   . Lumbago   . Obstructive sleep apnea (adult) (pediatric)   . Other malaise and fatigue   . Pain in joint, shoulder region   . Radiculopathy of lumbar region   . Reactive airway disease   . Scoliosis (and kyphoscoliosis), idiopathic   . 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 Unspecified asthma(493.90)   . Unspecified disorder of lipoid metabolism   . Unspecified vitamin D deficiency   . Vertigo     Past Surgical History:  Procedure Laterality Date  . NECK SURGERY      There were no vitals filed for this visit.  Subjective Assessment - 03/19/17 0944    Subjective  Pt reports pain in back and shoulder    Currently in Pain?  Yes    Pain Score  6     Pain Location  -- neck and back                      [facility] Adult PT Treatment/Exercise - 03/19/17 0001      Neck Exercises: Machines for Strengthening   Other Machines for Strengthening  Nustep level 3 x 6 minutes      Neck Exercises: Standing   Other Standing Exercises  3# shrugs and small rolls    Other Standing Exercises  back to wall small ball overhead raise      Lumbar Exercises:  Stretches   Passive Hamstring Stretch  4 reps;10 seconds    Piriformis Stretch  3 reps;10 seconds      Lumbar Exercises: Standing   Row  20 reps;Theraband;Both    Theraband Level (Row)  Level 1 (Yellow)    Shoulder Extension  Theraband;20 reps;Both    Theraband Level (Shoulder Extension)  Level 1 (Yellow)      Lumbar Exercises: Supine   Other Supine Lumbar Exercises  feet on ball K2C, trunk rotation and small bridges, with isometric abdominals      Moist Heat Therapy   Number Minutes Moist Heat  15 Minutes    Moist Heat Location  Shoulder;Cervical;Lumbar Spine      Electrical Stimulation   Electrical Stimulation Location  R shoulder, low back    Electrical Stimulation Action  Premod    Electrical Stimulation Parameters  supine    Electrical Stimulation Goals  Pain               PT Short Term Goals - 01/25/17 1041      PT SHORT TERM GOAL #  1   Title  pt to be I with inital HEP     Time  3    Period  Weeks    Status  Achieved      PT SHORT TERM GOAL #2   Title  verbalize and demo proper posture to prevent and reduce low back, neck and shoulder pain     Baseline  continued cues for proper form    Time  3    Period  Weeks    Status  Partially Met        PT Long Term Goals - 03/08/17 6553      PT LONG TERM GOAL #1   Title  pt to increase R shoulder AROM to 100 degrees for better ADL's    Time  8    Period  Weeks    Status  New      PT LONG TERM GOAL #2   Title  increase R shoulder strength to 4-/5 without pain > 4/10    Time  8    Period  Weeks    Status  New      PT LONG TERM GOAL #3   Title  increase lumbar ROM 25%    Time  8    Period  Weeks    Status  New      PT LONG TERM GOAL #4   Title  may return to walking for exercise or her gym at her apartment    Time  8    Period  Weeks    Status  New            Plan - 03/19/17 1017    Clinical Impression Statement  Pt ~ 10 minutes late for today's session. Pt more willing to do exercises,  she appears in pain but when asked she reports feeling ok. Pt reports K2C with physo ball feels good, Good HS flexibility on both LE, but tight in the piriformis area.    Rehab Potential  Fair    PT Frequency  2x / week    PT Duration  8 weeks    PT Treatment/Interventions  ADLs/Self Care Home Management;Electrical Stimulation;Cryotherapy;Iontophoresis 72m/ml Dexamethasone;Moist Heat;Ultrasound;Therapeutic exercise;Therapeutic activities;Dry needling;Taping;Manual techniques;Neuromuscular re-education;Passive range of motion;Patient/family education    PT Next Visit Plan  will resume PT trying to achieve her prior level of function and less pain with her ADL's       Patient will benefit from skilled therapeutic intervention in order to improve the following deficits and impairments:  Pain, Postural dysfunction, Improper body mechanics, Decreased mobility, Decreased strength, Decreased range of motion, Decreased endurance, Increased fascial restricitons, Increased muscle spasms, Decreased activity tolerance, Impaired UE functional use  Visit Diagnosis: Stiffness of right shoulder, not elsewhere classified  Chronic right shoulder pain  Abnormal posture     Problem List Patient Active Problem List   Diagnosis Date Noted  . MCI (mild cognitive impairment) 10/23/2016  . Chronic cough 10/19/2016  . Moderate single current episode of major depressive disorder ([place] 12/20/2015  . Other seasonal allergic rhinitis 03/31/2015  . 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 Muscle tension headache 10/06/2014  . Depression (emotion) 10/06/2014  . Dementia arising in the senium and presenium 10/06/2014  . Hypersomnia with sleep apnea 10/06/2014  . Neuropathy 07/08/2012  . Back pain 07/08/2012  . HYPERCHOLESTEROLEMIA 04/06/2010  . OVERWEIGHT 04/05/2010  . SKIN RASH 03/08/2010  . VERTIGO 05/06/2009  . CONJUNCTIVITIS, ALLERGIC 02/03/2009  . DYSPNEA ON EXERTION 11/03/2008  .  SLEEP APNEA,  OBSTRUCTIVE, MILD 07/28/2008  . VITAMIN D DEFICIENCY 06/25/2008  . FATIGUE 06/23/2008  . EDEMA 06/23/2008  . ALLERGIC RHINITIS 05/10/2008  . SHOULDER PAIN, RIGHT 04/22/2008  . NECK PAIN 10/27/2007  . REACTIVE AIRWAY DISEASE 10/01/2007  . PELVIC  PAIN 10/01/2007  . 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 POSTMENOPAUSAL STATUS 08/18/2007  . SCOLIOSIS, LUMBAR SPINE 08/18/2007  . VARICOSE VEINS, LOWER EXTREMITIES 07/28/2007  . GERD 07/28/2007  . 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 LUMBAGO 07/28/2007  . POSITIVE PPD 05/19/2007    Scot Jun 03/19/2017, 10:20 AM  [place], [place], [address] Phone: [phone]   Fax:  [phone]  Name: Kayla Davies MRN: [id number] Date of Birth: 12/13/1955

## 2017-03-21 ENCOUNTER — Telehealth: Payer: Self-pay | Admitting: *Deleted

## 2017-03-21 ENCOUNTER — Ambulatory Visit: Payer: Medicaid Other | Admitting: Physical Therapy

## 2017-03-21 NOTE — Telephone Encounter (Signed)
We are unable to reach this patient,by phone or mail. Referral remove.  Received: 2 weeks ago  Message Contents  Cindie CrumblyMiller, Deborah D  Hanson Medeiros M, LPN

## 2017-03-26 ENCOUNTER — Encounter: Payer: Self-pay | Admitting: Physical Therapy

## 2017-03-26 ENCOUNTER — Ambulatory Visit: Payer: Medicaid Other | Admitting: Physical Therapy

## 2017-03-26 DIAGNOSIS — G8929 Other chronic pain: Secondary | ICD-10-CM

## 2017-03-26 DIAGNOSIS — M25611 Stiffness of right shoulder, not elsewhere classified: Secondary | ICD-10-CM

## 2017-03-26 DIAGNOSIS — M25511 Pain in right shoulder: Secondary | ICD-10-CM

## 2017-03-26 DIAGNOSIS — M542 Cervicalgia: Secondary | ICD-10-CM

## 2017-03-26 DIAGNOSIS — R293 Abnormal posture: Secondary | ICD-10-CM

## 2017-03-26 NOTE — Therapy (Signed)
[place], [place], [address] Phone: [phone]   Fax:  [phone]  Physical Therapy Treatment  Patient Details  Name: Kayla Davies MRN: [id number] Date of Birth: [birth year] Referring Provider: Kevan Ny   Encounter Date: 03/26/2017  PT End of Session - 03/26/17 1522    Visit Number  4    Number of Visits  11    Date for PT Re-Evaluation  05/06/17    PT Start Time  6389    PT Stop Time  1536    PT Time Calculation (min)  61 min    Activity Tolerance  Patient tolerated treatment well    Behavior During Therapy  [facility] for tasks assessed/performed       Past Medical History:  Diagnosis Date  . Allergic rhinitis, cause unspecified   . Alzheimer's dementia   . Arthropathy, unspecified, other specified sites   . Asymptomatic varicose veins   . Bladder incontinence   . Bowel incontinence   . Bronchitis   . Cervicalgia   . Edema   . Esophageal reflux   . Lumbago   . Obstructive sleep apnea (adult) (pediatric)   . Other malaise and fatigue   . Pain in joint, shoulder region   . Radiculopathy of lumbar region   . Reactive airway disease   . Scoliosis (and kyphoscoliosis), idiopathic   . 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 Unspecified asthma(493.90)   . Unspecified disorder of lipoid metabolism   . Unspecified vitamin D deficiency   . Vertigo     Past Surgical History:  Procedure Laterality Date  . NECK SURGERY      There were no vitals filed for this visit.  Subjective Assessment - 03/26/17 1436    Subjective  Pt reports that she feels  fine but does have some pain in her shoulder and back    Patient is accompained by:  Interpreter    Currently in Pain?  Yes    Pain Score  6     Pain Location  -- R shoulder and back                      [facility] Adult PT Treatment/Exercise - 03/26/17 0001      Neck Exercises: Machines for Strengthening   Other Machines for Strengthening   Nustep level 4 x 6 minutes      Lumbar Exercises: Stretches   Passive Hamstring Stretch  4 reps;10 seconds    Piriformis Stretch  3 reps;10 seconds      Lumbar Exercises: Machines for Strengthening   Cybex Knee Extension  5lb 2x10    Cybex Knee Flexion  20lb 2x10      Lumbar Exercises: Standing   Row  20 reps;Theraband;Both    Theraband Level (Row)  Level 3 (Green)    Shoulder Extension  Theraband;20 reps;Both    Theraband Level (Shoulder Extension)  Level 3 (Green)    Other Standing Lumbar Exercises  ER yellow 2x10, straight arm pull downs 10 le    Other Standing Lumbar Exercises  over head ext red ball x5      Lumbar Exercises: Seated   Sit to Stand  10 reps x2 holding red ball       Moist Heat Therapy   Number Minutes Moist Heat  15 Minutes    Moist Heat Location  Shoulder;Cervical;Lumbar Spine      Electrical Stimulation   Electrical Stimulation  Location  R shoulder, low back    Electrical Stimulation Action  premod    Electrical Stimulation Parameters  supine    Electrical Stimulation Goals  Pain               PT Short Term Goals - 01/25/17 1041      PT SHORT TERM GOAL #1   Title  pt to be I with inital HEP     Time  3    Period  Weeks    Status  Achieved      PT SHORT TERM GOAL #2   Title  verbalize and demo proper posture to prevent and reduce low back, neck and shoulder pain     Baseline  continued cues for proper form    Time  3    Period  Weeks    Status  Partially Met        PT Long Term Goals - 03/08/17 9449      PT LONG TERM GOAL #1   Title  pt to increase R shoulder AROM to 100 degrees for better ADL's    Time  8    Period  Weeks    Status  New      PT LONG TERM GOAL #2   Title  increase R shoulder strength to 4-/5 without pain > 4/10    Time  8    Period  Weeks    Status  New      PT LONG TERM GOAL #3   Title  increase lumbar ROM 25%    Time  8    Period  Weeks    Status  New      PT LONG TERM GOAL #4   Title  may return  to walking for exercise or her gym at her apartment    Time  8    Period  Weeks    Status  New            Plan - 03/26/17 1526    Clinical Impression Statement  Pt tolerated progressed treatment session well. Pt tends to lean back handing in ger hip flexors when standing. Narrow BOS with sit to stand, cues needed to correct. Postural cues required with standing rows and extensions.     Rehab Potential  Fair    PT Duration  8 weeks    PT Treatment/Interventions  ADLs/Self Care Home Management;Electrical Stimulation;Cryotherapy;Iontophoresis 52m/ml Dexamethasone;Moist Heat;Ultrasound;Therapeutic exercise;Therapeutic activities;Dry needling;Taping;Manual techniques;Neuromuscular re-education;Passive range of motion;Patient/family education    PT Next Visit Plan  will resume PT trying to achieve her prior level of function and less pain with her ADL's       Patient will benefit from skilled therapeutic intervention in order to improve the following deficits and impairments:  Pain, Postural dysfunction, Improper body mechanics, Decreased mobility, Decreased strength, Decreased range of motion, Decreased endurance, Increased fascial restricitons, Increased muscle spasms, Decreased activity tolerance, Impaired UE functional use  Visit Diagnosis: Stiffness of right shoulder, not elsewhere classified  Chronic right shoulder pain  Abnormal posture  Cervicalgia     Problem List Patient Active Problem List   Diagnosis Date Noted  . MCI (mild cognitive impairment) 10/23/2016  . Chronic cough 10/19/2016  . Moderate single current episode of major depressive disorder ([place] 12/20/2015  . Other seasonal allergic rhinitis 03/31/2015  . 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 Muscle tension headache 10/06/2014  . Depression (emotion) 10/06/2014  . Dementia arising in the senium and presenium 10/06/2014  .  Hypersomnia with sleep apnea 10/06/2014  . Neuropathy 07/08/2012  . Back pain  07/08/2012  . HYPERCHOLESTEROLEMIA 04/06/2010  . OVERWEIGHT 04/05/2010  . SKIN RASH 03/08/2010  . VERTIGO 05/06/2009  . CONJUNCTIVITIS, ALLERGIC 02/03/2009  . DYSPNEA ON EXERTION 11/03/2008  . SLEEP APNEA, OBSTRUCTIVE, MILD 07/28/2008  . VITAMIN D DEFICIENCY 06/25/2008  . FATIGUE 06/23/2008  . EDEMA 06/23/2008  . ALLERGIC RHINITIS 05/10/2008  . SHOULDER PAIN, RIGHT 04/22/2008  . NECK PAIN 10/27/2007  . REACTIVE AIRWAY DISEASE 10/01/2007  . PELVIC  PAIN 10/01/2007  . 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 POSTMENOPAUSAL STATUS 08/18/2007  . SCOLIOSIS, LUMBAR SPINE 08/18/2007  . VARICOSE VEINS, LOWER EXTREMITIES 07/28/2007  . GERD 07/28/2007  . 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 LUMBAGO 07/28/2007  . POSITIVE PPD 05/19/2007    Scot Jun, PTA 03/26/2017, 3:28 PM  [place], [place], [address] Phone: [phone]   Fax:  [phone]  Name: Kayla Davies MRN: [id number] Date of Birth: November 23, 1955

## 2017-03-29 ENCOUNTER — Ambulatory Visit: Payer: Medicaid Other | Attending: Internal Medicine | Admitting: Physical Therapy

## 2017-03-29 ENCOUNTER — Encounter: Payer: Self-pay | Admitting: Physical Therapy

## 2017-03-29 DIAGNOSIS — M62830 Muscle spasm of back: Secondary | ICD-10-CM

## 2017-03-29 DIAGNOSIS — M25511 Pain in right shoulder: Secondary | ICD-10-CM | POA: Insufficient documentation

## 2017-03-29 DIAGNOSIS — M25611 Stiffness of right shoulder, not elsewhere classified: Secondary | ICD-10-CM | POA: Insufficient documentation

## 2017-03-29 DIAGNOSIS — R293 Abnormal posture: Secondary | ICD-10-CM | POA: Diagnosis present

## 2017-03-29 DIAGNOSIS — M542 Cervicalgia: Secondary | ICD-10-CM | POA: Insufficient documentation

## 2017-03-29 DIAGNOSIS — M545 Low back pain: Secondary | ICD-10-CM | POA: Diagnosis present

## 2017-03-29 DIAGNOSIS — G8929 Other chronic pain: Secondary | ICD-10-CM | POA: Diagnosis present

## 2017-03-29 NOTE — Therapy (Signed)
[place], [place], [address] Phone: [phone]   Fax:  [phone]  Physical Therapy Treatment  Patient Details  Name: Kayla Davies MRN: [id number] Date of Birth: [birth year] Referring Provider: Kevan Ny   Encounter Date: 03/29/2017  PT End of Session - 03/29/17 1034    Visit Number  5    Date for PT Re-Evaluation  05/06/17    PT Start Time  0930    PT Stop Time  1035    PT Time Calculation (min)  65 min    Activity Tolerance  Patient tolerated treatment well    Behavior During Therapy  [facility] for tasks assessed/performed       Past Medical History:  Diagnosis Date  . Allergic rhinitis, cause unspecified   . Alzheimer's dementia   . Arthropathy, unspecified, other specified sites   . Asymptomatic varicose veins   . Bladder incontinence   . Bowel incontinence   . Bronchitis   . Cervicalgia   . Edema   . Esophageal reflux   . Lumbago   . Obstructive sleep apnea (adult) (pediatric)   . Other malaise and fatigue   . Pain in joint, shoulder region   . Radiculopathy of lumbar region   . Reactive airway disease   . Scoliosis (and kyphoscoliosis), idiopathic   . 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 Unspecified asthma(493.90)   . Unspecified disorder of lipoid metabolism   . Unspecified vitamin D deficiency   . Vertigo     Past Surgical History:  Procedure Laterality Date  . NECK SURGERY      There were no vitals filed for this visit.  Subjective Assessment - 03/29/17 0932    Subjective  "Im ok but I still have pain"    Patient is accompained by:  Interpreter    Currently in Pain?  Yes    Pain Score  6     Pain Location  -- R shoulder and back                      [facility] Adult PT Treatment/Exercise - 03/29/17 0001      Neck Exercises: Machines for Strengthening   Other Machines for Strengthening  Nustep level 3 x 7 minutes      Neck Exercises: Standing   Other  Standing Exercises  1lb shoulder flex 2x10; Abd AROM x10      Lumbar Exercises: Machines for Strengthening   Cybex Knee Extension  5lb 2x10    Cybex Knee Flexion  20lb 2x10      Lumbar Exercises: Standing   Row  20 reps;Theraband;Both    Theraband Level (Row)  Level 3 (Green)    Shoulder Extension  Theraband;20 reps;Both    Other Standing Lumbar Exercises  ER yellow 2x10, straight arm pull downs 10 lb      Lumbar Exercises: Supine   Bridge  Compliant;2 seconds;10 reps      Moist Heat Therapy   Number Minutes Moist Heat  15 Minutes    Moist Heat Location  Shoulder;Cervical;Lumbar Spine      Electrical Stimulation   Electrical Stimulation Location  R shoulder, low back    Electrical Stimulation Action  pre mod    Electrical Stimulation Parameters  supine    Electrical Stimulation Goals  Pain               PT Short Term Goals - 01/25/17 1041  PT SHORT TERM GOAL #1   Title  pt to be I with inital HEP     Time  3    Period  Weeks    Status  Achieved      PT SHORT TERM GOAL #2   Title  verbalize and demo proper posture to prevent and reduce low back, neck and shoulder pain     Baseline  continued cues for proper form    Time  3    Period  Weeks    Status  Partially Met        PT Long Term Goals - 03/08/17 0569      PT LONG TERM GOAL #1   Title  pt to increase R shoulder AROM to 100 degrees for better ADL's    Time  8    Period  Weeks    Status  New      PT LONG TERM GOAL #2   Title  increase R shoulder strength to 4-/5 without pain > 4/10    Time  8    Period  Weeks    Status  New      PT LONG TERM GOAL #3   Title  increase lumbar ROM 25%    Time  8    Period  Weeks    Status  New      PT LONG TERM GOAL #4   Title  may return to walking for exercise or her gym at her apartment    Time  8    Period  Weeks    Status  New            Plan - 03/29/17 1035    Clinical Impression Statement  Pt still reports some R shoulder and low back  pain. postural cues needed with straight arm pull downs and rows. progressed to some more low level core exercises.     Rehab Potential  Fair    PT Frequency  2x / week    PT Duration  8 weeks    PT Treatment/Interventions  ADLs/Self Care Home Management;Electrical Stimulation;Cryotherapy;Iontophoresis 30m/ml Dexamethasone;Moist Heat;Ultrasound;Therapeutic exercise;Therapeutic activities;Dry needling;Taping;Manual techniques;Neuromuscular re-education;Passive range of motion;Patient/family education    PT Next Visit Plan  will resume PT trying to achieve her prior level of function and less pain with her ADL's       Patient will benefit from skilled therapeutic intervention in order to improve the following deficits and impairments:     Visit Diagnosis: Stiffness of right shoulder, not elsewhere classified  Chronic right shoulder pain  Abnormal posture  Cervicalgia  Muscle spasm of back  Bilateral low back pain, unspecified chronicity, with sciatica presence unspecified     Problem List Patient Active Problem List   Diagnosis Date Noted  . MCI (mild cognitive impairment) 10/23/2016  . Chronic cough 10/19/2016  . Moderate single current episode of major depressive disorder ([place] 12/20/2015  . Other seasonal allergic rhinitis 03/31/2015  . 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 Muscle tension headache 10/06/2014  . Depression (emotion) 10/06/2014  . Dementia arising in the senium and presenium 10/06/2014  . Hypersomnia with sleep apnea 10/06/2014  . Neuropathy 07/08/2012  . Back pain 07/08/2012  . HYPERCHOLESTEROLEMIA 04/06/2010  . OVERWEIGHT 04/05/2010  . SKIN RASH 03/08/2010  . VERTIGO 05/06/2009  . CONJUNCTIVITIS, ALLERGIC 02/03/2009  . DYSPNEA ON EXERTION 11/03/2008  . SLEEP APNEA, OBSTRUCTIVE, MILD 07/28/2008  . VITAMIN D DEFICIENCY 06/25/2008  . FATIGUE 06/23/2008  . EDEMA 06/23/2008  .  ALLERGIC RHINITIS 05/10/2008  . SHOULDER PAIN, RIGHT 04/22/2008  . NECK  PAIN 10/27/2007  . REACTIVE AIRWAY DISEASE 10/01/2007  . PELVIC  PAIN 10/01/2007  . 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 POSTMENOPAUSAL STATUS 08/18/2007  . SCOLIOSIS, LUMBAR SPINE 08/18/2007  . VARICOSE VEINS, LOWER EXTREMITIES 07/28/2007  . GERD 07/28/2007  . 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 LUMBAGO 07/28/2007  . POSITIVE PPD 05/19/2007    Scot Jun, PTA 03/29/2017, 10:40 AM  [place], [place], [address] Phone: [phone]   Fax:  [phone]  Name: Kayla Davies MRN: [id number] Date of Birth: [birth year]

## 2017-04-01 ENCOUNTER — Telehealth: Payer: Self-pay | Admitting: Cardiology

## 2017-04-01 DIAGNOSIS — R0600 Dyspnea, unspecified: Secondary | ICD-10-CM

## 2017-04-01 DIAGNOSIS — E7800 Pure hypercholesterolemia, unspecified: Secondary | ICD-10-CM

## 2017-04-01 DIAGNOSIS — R0609 Other forms of dyspnea: Principal | ICD-10-CM

## 2017-04-01 MED ORDER — METOPROLOL TARTRATE 50 MG PO TABS: 50.0000 mg | ORAL_TABLET | Freq: Once | ORAL | 0 refills | Status: DC

## 2017-04-01 NOTE — Telephone Encounter (Signed)
Spoke with patient and informed her that we would call her and arrange for her Coronary CTA for 3/18.. We will arrange lab work to be done.. A letter of instructions is at front desk..Kayla Davies

## 2017-04-01 NOTE — Telephone Encounter (Signed)
Yes, I would still recommend to order a coronary CTA. KN

## 2017-04-01 NOTE — Telephone Encounter (Signed)
Patient called to confirm appointment and states that she was told that she needs to pickup a pill from pharmacy to take prior to her appt with Dr. Delton SeeNelson on 04-08-17 and would like to verify if this is correct.

## 2017-04-01 NOTE — Telephone Encounter (Signed)
Spoke with patient and informed her that Dr Delton SeeNelson suggests that she have Coronary CTA.   We will try and get this scheduled as soon as we can since she has appt with Dr. Delton SeeNelson 3/11...  Patient is willing to come in for lab draw whenever it needs to be done..  Patient verbalized understanding.Marland Kitchen.  Re-ordered Lopressor 50 mg to be taken once prior to procedure..Marland Kitchen

## 2017-04-01 NOTE — Telephone Encounter (Signed)
Patient called to ask a question about a pill that she was supposed to take prior to her study..   She saw Dr. Delton SeeNelson on 01/14/17.Marland Kitchen. At that time she was ordered to have a coronary CTA and a calcium score..  She apparently was called numerous times, but she never returned call to schedule...    She now has a 2 month f/u on 3/11... Do you want to see her then or try to schedule the Coronary CTA/Calcium score first?  Please advise, thank you.Marland Kitchen..Marland Kitchen

## 2017-04-01 NOTE — Telephone Encounter (Signed)
Follow up    Patient calling in reference to voicemail she received. Please call to discuss.

## 2017-04-01 NOTE — Telephone Encounter (Signed)
I would reschedule for after CTA

## 2017-04-01 NOTE — Telephone Encounter (Signed)
Lpmtcb 3/4 md

## 2017-04-02 ENCOUNTER — Encounter: Payer: Self-pay | Admitting: Physical Therapy

## 2017-04-02 ENCOUNTER — Ambulatory Visit: Payer: Medicaid Other | Admitting: Physical Therapy

## 2017-04-02 DIAGNOSIS — M25611 Stiffness of right shoulder, not elsewhere classified: Secondary | ICD-10-CM | POA: Diagnosis not present

## 2017-04-02 DIAGNOSIS — G8929 Other chronic pain: Secondary | ICD-10-CM

## 2017-04-02 DIAGNOSIS — M25511 Pain in right shoulder: Principal | ICD-10-CM

## 2017-04-02 DIAGNOSIS — R293 Abnormal posture: Secondary | ICD-10-CM

## 2017-04-02 NOTE — Therapy (Signed)
[place], [place], [address] Phone: [phone]   Fax:  [phone]  Physical Therapy Treatment  Patient Details  Name: Kayla Davies MRN: [id number] Date of Birth: 03/06/1955 Referring Provider: Kevan Ny   Encounter Date: 04/02/2017  PT End of Session - 04/02/17 1011    Visit Number  6    Date for PT Re-Evaluation  05/06/17    Authorization Type  MCD    PT Start Time  0930    PT Stop Time  1026    PT Time Calculation (min)  56 min    Activity Tolerance  Patient tolerated treatment well    Behavior During Therapy  [facility] for tasks assessed/performed       Past Medical History:  Diagnosis Date  . Allergic rhinitis, cause unspecified   . Alzheimer's dementia   . Arthropathy, unspecified, other specified sites   . Asymptomatic varicose veins   . Bladder incontinence   . Bowel incontinence   . Bronchitis   . Cervicalgia   . Edema   . Esophageal reflux   . Lumbago   . Obstructive sleep apnea (adult) (pediatric)   . Other malaise and fatigue   . Pain in joint, shoulder region   . Radiculopathy of lumbar region   . Reactive airway disease   . Scoliosis (and kyphoscoliosis), idiopathic   . 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 Unspecified asthma(493.90)   . Unspecified disorder of lipoid metabolism   . Unspecified vitamin D deficiency   . Vertigo     Past Surgical History:  Procedure Laterality Date  . NECK SURGERY      There were no vitals filed for this visit.  Subjective Assessment - 04/02/17 0936    Subjective  "i am all right"    Patient is accompained by:  Interpreter    Currently in Pain?  Yes    Pain Score  5     Pain Location  -- back and shoulder                       [facility] Adult PT Treatment/Exercise - 04/02/17 0001      Neck Exercises: Machines for Strengthening   UBE (Upper Arm Bike)  L2 41fd/2rev    Other Machines for Strengthening  Nustep level 4 x 6  minutes      Neck Exercises: Standing   Other Standing Exercises   Abd AROM 2x10      Lumbar Exercises: Machines for Strengthening   Cybex Knee Extension  5lb 2x10    Cybex Knee Flexion  20lb 2x10      Lumbar Exercises: Standing   Shoulder Extension  Theraband;20 reps;Both    Theraband Level (Shoulder Extension)  Level 3 (Green)      Lumbar Exercises: Seated   Other Seated Lumbar Exercises  Rows, and lats 15lb 2x10       Lumbar Exercises: Supine   Bridge  Compliant;2 seconds;10 reps    Other Supine Lumbar Exercises  feet on ball K2C, trunk rotation and small bridges, with isometric abdominals      Moist Heat Therapy   Number Minutes Moist Heat  15 Minutes    Moist Heat Location  Shoulder;Cervical;Lumbar Spine      Electrical Stimulation   Electrical Stimulation Location  R shoulder, low back    Electrical Stimulation Action  pre mod    Electrical Stimulation Parameters  supine  Electrical Stimulation Goals  Pain               PT Short Term Goals - 01/25/17 1041      PT SHORT TERM GOAL #1   Title  pt to be I with inital HEP     Time  3    Period  Weeks    Status  Achieved      PT SHORT TERM GOAL #2   Title  verbalize and demo proper posture to prevent and reduce low back, neck and shoulder pain     Baseline  continued cues for proper form    Time  3    Period  Weeks    Status  Partially Met        PT Long Term Goals - 03/08/17 9833      PT LONG TERM GOAL #1   Title  pt to increase R shoulder AROM to 100 degrees for better ADL's    Time  8    Period  Weeks    Status  New      PT LONG TERM GOAL #2   Title  increase R shoulder strength to 4-/5 without pain > 4/10    Time  8    Period  Weeks    Status  New      PT LONG TERM GOAL #3   Title  increase lumbar ROM 25%    Time  8    Period  Weeks    Status  New      PT LONG TERM GOAL #4   Title  may return to walking for exercise or her gym at her apartment    Time  8    Period  Weeks     Status  New            Plan - 04/02/17 1012    Clinical Impression Statement  Pt does well wit all exercises despite reports of back and shoulder pain. She does not report any increase in pain while doing the exercises. Reports some neck tightness with shoulder abduction.     Rehab Potential  Fair    PT Frequency  2x / week    PT Duration  8 weeks    PT Treatment/Interventions  ADLs/Self Care Home Management;Electrical Stimulation;Cryotherapy;Iontophoresis 22m/ml Dexamethasone;Moist Heat;Ultrasound;Therapeutic exercise;Therapeutic activities;Dry needling;Taping;Manual techniques;Neuromuscular re-education;Passive range of motion;Patient/family education    PT Next Visit Plan  will resume PT trying to achieve her prior level of function and less pain with her ADL's       Patient will benefit from skilled therapeutic intervention in order to improve the following deficits and impairments:  Pain, Postural dysfunction, Improper body mechanics, Decreased mobility, Decreased strength, Decreased range of motion, Decreased endurance, Increased fascial restricitons, Increased muscle spasms, Decreased activity tolerance, Impaired UE functional use  Visit Diagnosis: Chronic right shoulder pain  Stiffness of right shoulder, not elsewhere classified  Abnormal posture     Problem List Patient Active Problem List   Diagnosis Date Noted  . MCI (mild cognitive impairment) 10/23/2016  . Chronic cough 10/19/2016  . Moderate single current episode of major depressive disorder ([place] 12/20/2015  . Other seasonal allergic rhinitis 03/31/2015  . 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 Muscle tension headache 10/06/2014  . Depression (emotion) 10/06/2014  . Dementia arising in the senium and presenium 10/06/2014  . Hypersomnia with sleep apnea 10/06/2014  . Neuropathy 07/08/2012  . Back pain 07/08/2012  . HYPERCHOLESTEROLEMIA 04/06/2010  . OVERWEIGHT  04/05/2010  . SKIN RASH 03/08/2010  .  VERTIGO 05/06/2009  . CONJUNCTIVITIS, ALLERGIC 02/03/2009  . DOE (dyspnea on exertion) 11/03/2008  . SLEEP APNEA, OBSTRUCTIVE, MILD 07/28/2008  . VITAMIN D DEFICIENCY 06/25/2008  . FATIGUE 06/23/2008  . EDEMA 06/23/2008  . ALLERGIC RHINITIS 05/10/2008  . SHOULDER PAIN, RIGHT 04/22/2008  . NECK PAIN 10/27/2007  . REACTIVE AIRWAY DISEASE 10/01/2007  . PELVIC  PAIN 10/01/2007  . 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 POSTMENOPAUSAL STATUS 08/18/2007  . SCOLIOSIS, LUMBAR SPINE 08/18/2007  . VARICOSE VEINS, LOWER EXTREMITIES 07/28/2007  . GERD 07/28/2007  . 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 LUMBAGO 07/28/2007  . POSITIVE PPD 05/19/2007    Scot Jun, PTA 04/02/2017, 10:14 AM  [place], [place], [address] Phone: [phone]   Fax:  [phone]  Name: Kayla Davies MRN: [id number] Date of Birth: [birth year]

## 2017-04-02 NOTE — Telephone Encounter (Signed)
[facility] and arrange her coronary CTA and BMET prior to that test, today.  Order for BMET will be placed.  [facility] Jasmine DecemberSharon, will cancel the pts OV appt with Dr Delton SeeNelson for 3/11, and move this out, for Dr Delton SeeNelson wanted the CT done and then to see the pt in the office for follow-up of this test.  Jasmine DecemberSharon will send me the dates, once she makes contact with the pt.

## 2017-04-02 NOTE — Telephone Encounter (Signed)
Pts coronary CT is scheduled for 04/15/17 at 1030.  BMET to be done on 04/11/17.  Went over pts coronary ct instructions with her over the phone.  Advised her on how to take her 1 time dose of metoprolol 50 mg po one time only, 1 hour prior to her coronary ct.  Pt verbalized understanding and agrees with this plan.

## 2017-04-05 ENCOUNTER — Ambulatory Visit: Payer: Medicaid Other | Admitting: Physical Therapy

## 2017-04-05 ENCOUNTER — Other Ambulatory Visit: Payer: Medicaid Other | Admitting: *Deleted

## 2017-04-05 ENCOUNTER — Encounter: Payer: Self-pay | Admitting: Physical Therapy

## 2017-04-05 DIAGNOSIS — M25511 Pain in right shoulder: Principal | ICD-10-CM

## 2017-04-05 DIAGNOSIS — G8929 Other chronic pain: Secondary | ICD-10-CM

## 2017-04-05 DIAGNOSIS — M25611 Stiffness of right shoulder, not elsewhere classified: Secondary | ICD-10-CM | POA: Diagnosis not present

## 2017-04-05 DIAGNOSIS — R0600 Dyspnea, unspecified: Secondary | ICD-10-CM

## 2017-04-05 DIAGNOSIS — E7800 Pure hypercholesterolemia, unspecified: Secondary | ICD-10-CM

## 2017-04-05 DIAGNOSIS — R0609 Other forms of dyspnea: Principal | ICD-10-CM

## 2017-04-05 DIAGNOSIS — R293 Abnormal posture: Secondary | ICD-10-CM

## 2017-04-05 NOTE — Therapy (Signed)
[place], [place], [address] Phone: [phone]   Fax:  [phone]  Physical Therapy Treatment  Patient Details  Name: Kayla Davies MRN: [id number] Date of Birth: 1955/05/28 Referring Provider: Kevan Ny   Encounter Date: 04/05/2017  PT End of Session - 04/05/17 0931    Visit Number  7    Date for PT Re-Evaluation  05/06/17    Authorization Type  MCD    PT Start Time  0845    PT Stop Time  8366    PT Time Calculation (min)  59 min       Past Medical History:  Diagnosis Date  . Allergic rhinitis, cause unspecified   . Alzheimer's dementia   . Arthropathy, unspecified, other specified sites   . Asymptomatic varicose veins   . Bladder incontinence   . Bowel incontinence   . Bronchitis   . Cervicalgia   . Edema   . Esophageal reflux   . Lumbago   . Obstructive sleep apnea (adult) (pediatric)   . Other malaise and fatigue   . Pain in joint, shoulder region   . Radiculopathy of lumbar region   . Reactive airway disease   . Scoliosis (and kyphoscoliosis), idiopathic   . 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 Unspecified asthma(493.90)   . Unspecified disorder of lipoid metabolism   . Unspecified vitamin D deficiency   . Vertigo     Past Surgical History:  Procedure Laterality Date  . NECK SURGERY      There were no vitals filed for this visit.  Subjective Assessment - 04/05/17 0849    Subjective  Pt reports that she still has paid in ger back and R shoulder    Currently in Pain?  Yes    Pain Score  6  back and shoulder                       [facility] Adult PT Treatment/Exercise - 04/05/17 0001      Neck Exercises: Machines for Strengthening   UBE (Upper Arm Bike)  L2 11fd/3rev    Other Machines for Strengthening  Nustep level 4 x 6 minutes      Lumbar Exercises: Stretches   Single Knee to Chest Stretch  10 seconds;4 reps    Double Knee to Chest Stretch  4 reps;10  seconds    Lower Trunk Rotation  4 reps;20 seconds    Piriformis Stretch  3 reps;10 seconds      Lumbar Exercises: Standing   Other Standing Lumbar Exercises  Straight arm pull downs 10lb 2x10       Lumbar Exercises: Seated   Other Seated Lumbar Exercises  abb sets with pball 2x10    Other Seated Lumbar Exercises  Rows, and lats 15lb 2x10       Moist Heat Therapy   Number Minutes Moist Heat  15 Minutes    Moist Heat Location  Shoulder;Cervical;Lumbar Spine      Electrical Stimulation   Electrical Stimulation Location  R shoulder, low back    Electrical Stimulation Action  pre mod    Electrical Stimulation Parameters  supine    Electrical Stimulation Goals  Pain               PT Short Term Goals - 01/25/17 1041      PT SHORT TERM GOAL #1   Title  pt to be I with inital HEP  Time  3    Period  Weeks    Status  Achieved      PT SHORT TERM GOAL #2   Title  verbalize and demo proper posture to prevent and reduce low back, neck and shoulder pain     Baseline  continued cues for proper form    Time  3    Period  Weeks    Status  Partially Met        PT Long Term Goals - 03/08/17 2947      PT LONG TERM GOAL #1   Title  pt to increase R shoulder AROM to 100 degrees for better ADL's    Time  8    Period  Weeks    Status  New      PT LONG TERM GOAL #2   Title  increase R shoulder strength to 4-/5 without pain > 4/10    Time  8    Period  Weeks    Status  New      PT LONG TERM GOAL #3   Title  increase lumbar ROM 25%    Time  8    Period  Weeks    Status  New      PT LONG TERM GOAL #4   Title  may return to walking for exercise or her gym at her apartment    Time  [place] - 04/05/17 0931    Clinical Impression Statement  Pt continues to reports neck and back pain. Added more core interventions today without issue. Pt able to tolerated more weight with seated rows and lats. Encourage pt not to wear back  brace al the time because her muscles with become dependent on them and will not strengthen.     Rehab Potential  Fair    PT Frequency  2x / week    PT Duration  8 weeks    PT Treatment/Interventions  ADLs/Self Care Home Management;Electrical Stimulation;Cryotherapy;Iontophoresis 56m/ml Dexamethasone;Moist Heat;Ultrasound;Therapeutic exercise;Therapeutic activities;Dry needling;Taping;Manual techniques;Neuromuscular re-education;Passive range of motion;Patient/family education    PT Next Visit Plan  will resume PT trying to achieve her prior level of function and less pain with her ADL's, PT without back brace.       Patient will benefit from skilled therapeutic intervention in order to improve the following deficits and impairments:  Pain, Postural dysfunction, Improper body mechanics, Decreased mobility, Decreased strength, Decreased range of motion, Decreased endurance, Increased fascial restricitons, Increased muscle spasms, Decreased activity tolerance, Impaired UE functional use  Visit Diagnosis: Chronic right shoulder pain  Stiffness of right shoulder, not elsewhere classified  Abnormal posture     Problem List Patient Active Problem List   Diagnosis Date Noted  . MCI (mild cognitive impairment) 10/23/2016  . Chronic cough 10/19/2016  . Moderate single current episode of major depressive disorder ([place] 12/20/2015  . Other seasonal allergic rhinitis 03/31/2015  . 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 Muscle tension headache 10/06/2014  . Depression (emotion) 10/06/2014  . Dementia arising in the senium and presenium 10/06/2014  . Hypersomnia with sleep apnea 10/06/2014  . Neuropathy 07/08/2012  . Back pain 07/08/2012  . HYPERCHOLESTEROLEMIA 04/06/2010  . OVERWEIGHT 04/05/2010  . SKIN RASH 03/08/2010  . VERTIGO 05/06/2009  . CONJUNCTIVITIS, ALLERGIC 02/03/2009  . DOE (dyspnea on exertion) 11/03/2008  . SLEEP APNEA, OBSTRUCTIVE, MILD 07/28/2008  .  VITAMIN D DEFICIENCY  06/25/2008  . FATIGUE 06/23/2008  . EDEMA 06/23/2008  . ALLERGIC RHINITIS 05/10/2008  . SHOULDER PAIN, RIGHT 04/22/2008  . NECK PAIN 10/27/2007  . REACTIVE AIRWAY DISEASE 10/01/2007  . PELVIC  PAIN 10/01/2007  . 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 POSTMENOPAUSAL STATUS 08/18/2007  . SCOLIOSIS, LUMBAR SPINE 08/18/2007  . VARICOSE VEINS, LOWER EXTREMITIES 07/28/2007  . GERD 07/28/2007  . 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 LUMBAGO 07/28/2007  . POSITIVE PPD 05/19/2007    Scot Jun, PTA 04/05/2017, 9:34 AM  [place], [place], [address] Phone: [phone]   Fax:  [phone]  Name: Kayla Davies MRN: [id number] Date of Birth: 1956-01-15

## 2017-04-06 LAB — BASIC METABOLIC PANEL
BUN/Creatinine Ratio: 14 (ref 12–28)
BUN: 9 mg/dL (ref 8–27)
CO2: 27 mmol/L (ref 20–29)
Calcium: 9.1 mg/dL (ref 8.7–10.3)
Chloride: 100 mmol/L (ref 96–106)
Creatinine, Ser: 0.65 mg/dL (ref 0.57–1.00)
GFR calc Af Amer: 109 mL/min/{1.73_m2} (ref >59)
GFR calc non Af Amer: 95 mL/min/{1.73_m2} (ref >59)
Glucose: 107 mg/dL — ABNORMAL HIGH (ref 65–99)
Potassium: 4 mmol/L (ref 3.5–5.2)
Sodium: 140 mmol/L (ref 134–144)

## 2017-04-08 ENCOUNTER — Ambulatory Visit: Payer: Medicaid Other | Admitting: Cardiology

## 2017-04-09 ENCOUNTER — Encounter: Payer: Self-pay | Admitting: Physical Therapy

## 2017-04-09 ENCOUNTER — Ambulatory Visit: Payer: Medicaid Other | Admitting: Physical Therapy

## 2017-04-09 DIAGNOSIS — M542 Cervicalgia: Secondary | ICD-10-CM

## 2017-04-09 DIAGNOSIS — M25611 Stiffness of right shoulder, not elsewhere classified: Secondary | ICD-10-CM | POA: Diagnosis not present

## 2017-04-09 DIAGNOSIS — R293 Abnormal posture: Secondary | ICD-10-CM

## 2017-04-09 DIAGNOSIS — M25511 Pain in right shoulder: Secondary | ICD-10-CM

## 2017-04-09 DIAGNOSIS — G8929 Other chronic pain: Secondary | ICD-10-CM

## 2017-04-09 NOTE — Therapy (Signed)
[place], [place], [address] Phone: [phone]   Fax:  [phone]  Physical Therapy Treatment  Patient Details  Name: Kayla Davies MRN: [id number] Date of Birth: [birth year] Referring Provider: Kevan Ny   Encounter Date: 04/09/2017  PT End of Session - 04/09/17 1343    Visit Number  8    Date for PT Re-Evaluation  05/06/17    PT Start Time  1257    PT Stop Time  1357    PT Time Calculation (min)  60 min    Activity Tolerance  Patient tolerated treatment well    Behavior During Therapy  [facility] for tasks assessed/performed       Past Medical History:  Diagnosis Date  . Allergic rhinitis, cause unspecified   . Alzheimer's dementia   . Arthropathy, unspecified, other specified sites   . Asymptomatic varicose veins   . Bladder incontinence   . Bowel incontinence   . Bronchitis   . Cervicalgia   . Edema   . Esophageal reflux   . Lumbago   . Obstructive sleep apnea (adult) (pediatric)   . Other malaise and fatigue   . Pain in joint, shoulder region   . Radiculopathy of lumbar region   . Reactive airway disease   . Scoliosis (and kyphoscoliosis), idiopathic   . 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 Unspecified asthma(493.90)   . Unspecified disorder of lipoid metabolism   . Unspecified vitamin D deficiency   . Vertigo     Past Surgical History:  Procedure Laterality Date  . NECK SURGERY      There were no vitals filed for this visit.  Subjective Assessment - 04/09/17 1301    Subjective  "I am ok but I still have pain"    Currently in Pain?  Yes    Pain Score  5     Pain Location  -- shoulder and back                      [facility] Adult PT Treatment/Exercise - 04/09/17 0001      Neck Exercises: Machines for Strengthening   UBE (Upper Arm Bike)  L2 31fd/2rev    Other Machines for Strengthening  Nustep level 5 x 6 minutes      Lumbar Exercises: Machines for Strengthening    Cybex Knee Extension  5lb 2x10    Cybex Knee Flexion  25lb 2x10      Lumbar Exercises: Standing   Other Standing Lumbar Exercises  step ups 6 in x10 each      Lumbar Exercises: Seated   Sit to Stand  10 reps xy, with OHP yellow ball     Other Seated Lumbar Exercises  Rows, and lats 20lb 2x10       Lumbar Exercises: Supine   Bridge  Compliant;2 seconds;15 reps    Other Supine Lumbar Exercises  feet on ball K2C, trunk rotation and small bridges, with isometric abdominals      Moist Heat Therapy   Number Minutes Moist Heat  15 Minutes    Moist Heat Location  Shoulder;Cervical;Lumbar Spine      Electrical Stimulation   Electrical Stimulation Location  R shoulder, low back    Electrical Stimulation Action  pre mod    Electrical Stimulation Parameters  supine    Electrical Stimulation Goals  Pain               PT  Short Term Goals - 01/25/17 1041      PT SHORT TERM GOAL #1   Title  pt to be I with inital HEP     Time  3    Period  Weeks    Status  Achieved      PT SHORT TERM GOAL #2   Title  verbalize and demo proper posture to prevent and reduce low back, neck and shoulder pain     Baseline  continued cues for proper form    Time  3    Period  Weeks    Status  Partially Met        PT Long Term Goals - 03/08/17 9326      PT LONG TERM GOAL #1   Title  pt to increase R shoulder AROM to 100 degrees for better ADL's    Time  8    Period  Weeks    Status  New      PT LONG TERM GOAL #2   Title  increase R shoulder strength to 4-/5 without pain > 4/10    Time  8    Period  Weeks    Status  New      PT LONG TERM GOAL #3   Title  increase lumbar ROM 25%    Time  8    Period  Weeks    Status  New      PT LONG TERM GOAL #4   Title  may return to walking for exercise or her gym at her apartment    Time  8    Period  Weeks    Status  New            Plan - 04/09/17 1344    Clinical Impression Statement  Increase weight tolerated with machine level  exercises, she does fatigue quick with functional activities. No reports of increase pain. Postural cues needed with seated rows.    Rehab Potential  Fair    PT Frequency  2x / week    PT Duration  8 weeks    PT Treatment/Interventions  ADLs/Self Care Home Management;Electrical Stimulation;Cryotherapy;Iontophoresis 31m/ml Dexamethasone;Moist Heat;Ultrasound;Therapeutic exercise;Therapeutic activities;Dry needling;Taping;Manual techniques;Neuromuscular re-education;Passive range of motion;Patient/family education    PT Next Visit Plan  will resume PT trying to achieve her prior level of function and less pain with her ADL's, PT without back brace.       Patient will benefit from skilled therapeutic intervention in order to improve the following deficits and impairments:  Pain, Postural dysfunction, Improper body mechanics, Decreased mobility, Decreased strength, Decreased range of motion, Decreased endurance, Increased fascial restricitons, Increased muscle spasms, Decreased activity tolerance, Impaired UE functional use  Visit Diagnosis: Stiffness of right shoulder, not elsewhere classified  Abnormal posture  Chronic right shoulder pain  Cervicalgia     Problem List Patient Active Problem List   Diagnosis Date Noted  . MCI (mild cognitive impairment) 10/23/2016  . Chronic cough 10/19/2016  . Moderate single current episode of major depressive disorder ([place] 12/20/2015  . Other seasonal allergic rhinitis 03/31/2015  . 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 Muscle tension headache 10/06/2014  . Depression (emotion) 10/06/2014  . Dementia arising in the senium and presenium 10/06/2014  . Hypersomnia with sleep apnea 10/06/2014  . Neuropathy 07/08/2012  . Back pain 07/08/2012  . HYPERCHOLESTEROLEMIA 04/06/2010  . OVERWEIGHT 04/05/2010  . SKIN RASH 03/08/2010  . VERTIGO 05/06/2009  . CONJUNCTIVITIS, ALLERGIC 02/03/2009  . DOE (dyspnea on exertion) 11/03/2008  .  SLEEP APNEA,  OBSTRUCTIVE, MILD 07/28/2008  . VITAMIN D DEFICIENCY 06/25/2008  . FATIGUE 06/23/2008  . EDEMA 06/23/2008  . ALLERGIC RHINITIS 05/10/2008  . SHOULDER PAIN, RIGHT 04/22/2008  . NECK PAIN 10/27/2007  . REACTIVE AIRWAY DISEASE 10/01/2007  . PELVIC  PAIN 10/01/2007  . 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 POSTMENOPAUSAL STATUS 08/18/2007  . SCOLIOSIS, LUMBAR SPINE 08/18/2007  . VARICOSE VEINS, LOWER EXTREMITIES 07/28/2007  . GERD 07/28/2007  . 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 LUMBAGO 07/28/2007  . POSITIVE PPD 05/19/2007    Scot Jun, PTA 04/09/2017, 1:50 PM  [place], [place], [address] Phone: [phone]   Fax:  [phone]  Name: Kayla Davies MRN: [id number] Date of Birth: [birth year]

## 2017-04-11 ENCOUNTER — Ambulatory Visit: Payer: Medicaid Other | Admitting: Physical Therapy

## 2017-04-11 ENCOUNTER — Encounter: Payer: Self-pay | Admitting: Physical Therapy

## 2017-04-11 ENCOUNTER — Other Ambulatory Visit: Payer: Medicaid Other

## 2017-04-11 DIAGNOSIS — M25611 Stiffness of right shoulder, not elsewhere classified: Secondary | ICD-10-CM | POA: Diagnosis not present

## 2017-04-11 DIAGNOSIS — G8929 Other chronic pain: Secondary | ICD-10-CM

## 2017-04-11 DIAGNOSIS — M25511 Pain in right shoulder: Secondary | ICD-10-CM

## 2017-04-11 DIAGNOSIS — R293 Abnormal posture: Secondary | ICD-10-CM

## 2017-04-11 NOTE — Therapy (Signed)
[place], [place], [address] Phone: [phone]   Fax:  [phone]  Physical Therapy Treatment  Patient Details  Name: Kayla Davies MRN: [id number] Date of Birth: Jan 12, 1956 Referring Provider: Kevan Ny   Encounter Date: 04/11/2017  PT End of Session - 04/11/17 1602    Visit Number  9    PT Start Time  1520    PT Stop Time  1616    PT Time Calculation (min)  56 min    Activity Tolerance  Patient tolerated treatment well    Behavior During Therapy  [facility] for tasks assessed/performed       Past Medical History:  Diagnosis Date  . Allergic rhinitis, cause unspecified   . Alzheimer's dementia   . Arthropathy, unspecified, other specified sites   . Asymptomatic varicose veins   . Bladder incontinence   . Bowel incontinence   . Bronchitis   . Cervicalgia   . Edema   . Esophageal reflux   . Lumbago   . Obstructive sleep apnea (adult) (pediatric)   . Other malaise and fatigue   . Pain in joint, shoulder region   . Radiculopathy of lumbar region   . Reactive airway disease   . Scoliosis (and kyphoscoliosis), idiopathic   . 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 Unspecified asthma(493.90)   . Unspecified disorder of lipoid metabolism   . Unspecified vitamin D deficiency   . Vertigo     Past Surgical History:  Procedure Laterality Date  . NECK SURGERY      There were no vitals filed for this visit.  Subjective Assessment - 04/11/17 1526    Subjective  "I am all right"    Patient is accompained by:  Interpreter    Currently in Pain?  Yes    Pain Score  4     Pain Location  -- R shoulder and back                      [facility] Adult PT Treatment/Exercise - 04/11/17 0001      Neck Exercises: Machines for Strengthening   UBE (Upper Arm Bike)  L4 24fd/3rev    Other Machines for Strengthening  Nustep level 5 x 6 minutes      Lumbar Exercises: Standing   Other Standing Lumbar  Exercises  overhead ext 2x10, trunk rotatiions 2x10     Other Standing Lumbar Exercises  straight arm pull down 20lb 2x10       Lumbar Exercises: Seated   Other Seated Lumbar Exercises  Rows, and lats 20lb 2x10       Moist Heat Therapy   Number Minutes Moist Heat  15 Minutes    Moist Heat Location  Shoulder;Cervical;Lumbar Spine      Electrical Stimulation   Electrical Stimulation Location  R shoulder, low back    Electrical Stimulation Action  pre mod    Electrical Stimulation Parameters  supine    Electrical Stimulation Goals  Pain               PT Short Term Goals - 01/25/17 1041      PT SHORT TERM GOAL #1   Title  pt to be I with inital HEP     Time  3    Period  Weeks    Status  Achieved      PT SHORT TERM GOAL #2   Title  verbalize and demo proper  posture to prevent and reduce low back, neck and shoulder pain     Baseline  continued cues for proper form    Time  3    Period  Weeks    Status  Partially Met        PT Long Term Goals - 03/08/17 7425      PT LONG TERM GOAL #1   Title  pt to increase R shoulder AROM to 100 degrees for better ADL's    Time  8    Period  Weeks    Status  New      PT LONG TERM GOAL #2   Title  increase R shoulder strength to 4-/5 without pain > 4/10    Time  8    Period  Weeks    Status  New      PT LONG TERM GOAL #3   Title  increase lumbar ROM 25%    Time  8    Period  Weeks    Status  New      PT LONG TERM GOAL #4   Title  may return to walking for exercise or her gym at her apartment    Time  8    Period  Weeks    Status  New            Plan - 04/11/17 1602    Clinical Impression Statement  Pt ~ 5 minutes late for today's treatment session. All activities performed well without increase pain. Pt does reports 4/10 back and R shoulder pain before treatment.     Rehab Potential  Fair    PT Frequency  2x / week    PT Duration  8 weeks    PT Treatment/Interventions  ADLs/Self Care Home  Management;Electrical Stimulation;Cryotherapy;Iontophoresis 43m/ml Dexamethasone;Moist Heat;Ultrasound;Therapeutic exercise;Therapeutic activities;Dry needling;Taping;Manual techniques;Neuromuscular re-education;Passive range of motion;Patient/family education    PT Next Visit Plan  Davies resume PT trying to achieve her prior level of function and less pain with her ADL's, PT without back brace. core strength       Patient Davies benefit from skilled therapeutic intervention in order to improve the following deficits and impairments:  Pain, Postural dysfunction, Improper body mechanics, Decreased mobility, Decreased strength, Decreased range of motion, Decreased endurance, Increased fascial restricitons, Increased muscle spasms, Decreased activity tolerance, Impaired UE functional use  Visit Diagnosis: Stiffness of right shoulder, not elsewhere classified  Abnormal posture  Chronic right shoulder pain     Problem List Patient Active Problem List   Diagnosis Date Noted  . MCI (mild cognitive impairment) 10/23/2016  . Chronic cough 10/19/2016  . Moderate single current episode of major depressive disorder ([place] 12/20/2015  . Other seasonal allergic rhinitis 03/31/2015  . 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 Muscle tension headache 10/06/2014  . Depression (emotion) 10/06/2014  . Dementia arising in the senium and presenium 10/06/2014  . Hypersomnia with sleep apnea 10/06/2014  . Neuropathy 07/08/2012  . Back pain 07/08/2012  . HYPERCHOLESTEROLEMIA 04/06/2010  . OVERWEIGHT 04/05/2010  . SKIN RASH 03/08/2010  . VERTIGO 05/06/2009  . CONJUNCTIVITIS, ALLERGIC 02/03/2009  . DOE (dyspnea on exertion) 11/03/2008  . SLEEP APNEA, OBSTRUCTIVE, MILD 07/28/2008  . VITAMIN D DEFICIENCY 06/25/2008  . FATIGUE 06/23/2008  . EDEMA 06/23/2008  . ALLERGIC RHINITIS 05/10/2008  . SHOULDER PAIN, RIGHT 04/22/2008  . NECK PAIN 10/27/2007  . REACTIVE AIRWAY DISEASE 10/01/2007  . PELVIC  PAIN  10/01/2007  . 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 POSTMENOPAUSAL STATUS 08/18/2007  .  SCOLIOSIS, LUMBAR SPINE 08/18/2007  . VARICOSE VEINS, LOWER EXTREMITIES 07/28/2007  . GERD 07/28/2007  . 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 LUMBAGO 07/28/2007  . POSITIVE PPD 05/19/2007    Scot Jun, PTA 04/11/2017, 4:04 PM  [place], [place], [address] Phone: [phone]   Fax:  [phone]  Name: FUMIKO CHAM MRN: [id number] Date of Birth: 1955/03/03

## 2017-04-12 ENCOUNTER — Other Ambulatory Visit: Payer: Medicaid Other

## 2017-04-15 ENCOUNTER — Ambulatory Visit (HOSPITAL_COMMUNITY): Payer: Medicaid Other

## 2017-04-15 ENCOUNTER — Ambulatory Visit (HOSPITAL_COMMUNITY)
Admission: RE | Admit: 2017-04-15 | Discharge: 2017-04-15 | Disposition: A | Discharge status: Home / Self Care | Payer: Medicaid Other | Source: Ambulatory Visit | Attending: Cardiology | Admitting: Cardiology

## 2017-04-15 DIAGNOSIS — R0609 Other forms of dyspnea: Secondary | ICD-10-CM | POA: Diagnosis not present

## 2017-04-15 DIAGNOSIS — E7849 Other hyperlipidemia: Secondary | ICD-10-CM | POA: Insufficient documentation

## 2017-04-15 DIAGNOSIS — R072 Precordial pain: Secondary | ICD-10-CM

## 2017-04-15 DIAGNOSIS — R0600 Dyspnea, unspecified: Secondary | ICD-10-CM

## 2017-04-15 MED ORDER — IOPAMIDOL (ISOVUE-370) INJECTION 76%
INTRAVENOUS | Status: AC
  Filled 2017-04-15: qty 100

## 2017-04-15 MED ORDER — METOPROLOL TARTRATE 5 MG/5ML IV SOLN
5.0000 mg | INTRAVENOUS | Status: DC | PRN
  Administered 2017-04-15: 5 mg via INTRAVENOUS

## 2017-04-15 MED ORDER — NITROGLYCERIN 0.4 MG SL SUBL
SUBLINGUAL_TABLET | SUBLINGUAL | Status: AC
  Administered 2017-04-15: 0.8 mg via SUBLINGUAL
  Filled 2017-04-15: qty 1

## 2017-04-15 MED ORDER — NITROGLYCERIN 0.4 MG SL SUBL
0.8000 mg | SUBLINGUAL_TABLET | Freq: Once | SUBLINGUAL | Status: AC
  Administered 2017-04-15: 0.8 mg via SUBLINGUAL

## 2017-04-15 MED ORDER — METOPROLOL TARTRATE 5 MG/5ML IV SOLN
INTRAVENOUS | Status: AC
  Administered 2017-04-15: 5 mg via INTRAVENOUS
  Filled 2017-04-15: qty 5

## 2017-04-16 ENCOUNTER — Encounter: Payer: Self-pay | Admitting: Physical Therapy

## 2017-04-16 ENCOUNTER — Ambulatory Visit: Payer: Medicaid Other | Admitting: Physical Therapy

## 2017-04-16 DIAGNOSIS — M25511 Pain in right shoulder: Secondary | ICD-10-CM

## 2017-04-16 DIAGNOSIS — M25611 Stiffness of right shoulder, not elsewhere classified: Secondary | ICD-10-CM | POA: Diagnosis not present

## 2017-04-16 DIAGNOSIS — R293 Abnormal posture: Secondary | ICD-10-CM

## 2017-04-16 DIAGNOSIS — G8929 Other chronic pain: Secondary | ICD-10-CM

## 2017-04-16 DIAGNOSIS — M542 Cervicalgia: Secondary | ICD-10-CM

## 2017-04-16 NOTE — Therapy (Signed)
[place], [place], [address] Phone: [phone]   Fax:  [phone]  Physical Therapy Treatment  Patient Details  Name: Kayla Davies MRN: [id number] Date of Birth: 07/19/1955 Referring Provider: Kevan Ny   Encounter Date: 04/16/2017  PT End of Session - 04/16/17 1516    Visit Number  10    Date for PT Re-Evaluation  05/06/17    Authorization Time Period  01/27/17    PT Start Time  1430    PT Stop Time  1529    PT Time Calculation (min)  59 min    Activity Tolerance  Patient tolerated treatment well    Behavior During Therapy  [facility] for tasks assessed/performed       Past Medical History:  Diagnosis Date  . Allergic rhinitis, cause unspecified   . Alzheimer's dementia   . Arthropathy, unspecified, other specified sites   . Asymptomatic varicose veins   . Bladder incontinence   . Bowel incontinence   . Bronchitis   . Cervicalgia   . Edema   . Esophageal reflux   . Lumbago   . Obstructive sleep apnea (adult) (pediatric)   . Other malaise and fatigue   . Pain in joint, shoulder region   . Radiculopathy of lumbar region   . Reactive airway disease   . Scoliosis (and kyphoscoliosis), idiopathic   . 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 Unspecified asthma(493.90)   . Unspecified disorder of lipoid metabolism   . Unspecified vitamin D deficiency   . Vertigo     Past Surgical History:  Procedure Laterality Date  . NECK SURGERY      There were no vitals filed for this visit.  Subjective Assessment - 04/16/17 1440    Subjective  "Im ok"    Currently in Pain?  Yes    Pain Score  4     Pain Location  -- neck and back                      [facility] Adult PT Treatment/Exercise - 04/16/17 0001      Neck Exercises: Machines for Strengthening   UBE (Upper Arm Bike)  L4 96fd/3rev    Other Machines for Strengthening  Nustep level 5 x 6 minutes      Neck Exercises: Standing   Other Standing Exercises  1lb shoulder flex 2x10      Lumbar Exercises: Stretches   Passive Hamstring Stretch  Left;Right;3 reps;10 seconds    Single Knee to Chest Stretch  Left;Right;3 reps;10 seconds    Piriformis Stretch  3 reps;10 seconds      Lumbar Exercises: Machines for Strengthening   Leg Press  20lb 2x10      Lumbar Exercises: Standing   Shoulder Extension  Theraband;20 reps;Both    Theraband Level (Shoulder Extension)  Level 3 (Green)      Lumbar Exercises: Seated   Other Seated Lumbar Exercises  Rows, and lats 20lb 2x10       Moist Heat Therapy   Number Minutes Moist Heat  15 Minutes    Moist Heat Location  Shoulder;Cervical;Lumbar Spine      Electrical Stimulation   Electrical Stimulation Location  R shoulder, low back    Electrical Stimulation Action  pre mod    Electrical Stimulation Parameters  supine    Electrical Stimulation Goals  Pain  PT Short Term Goals - 01/25/17 1041      PT SHORT TERM GOAL #1   Title  pt to be I with inital HEP     Time  3    Period  Weeks    Status  Achieved      PT SHORT TERM GOAL #2   Title  verbalize and demo proper posture to prevent and reduce low back, neck and shoulder pain     Baseline  continued cues for proper form    Time  3    Period  Weeks    Status  Partially Met        PT Long Term Goals - 03/08/17 3154      PT LONG TERM GOAL #1   Title  pt to increase R shoulder AROM to 100 degrees for better ADL's    Time  8    Period  Weeks    Status  New      PT LONG TERM GOAL #2   Title  increase R shoulder strength to 4-/5 without pain > 4/10    Time  8    Period  Weeks    Status  New      PT LONG TERM GOAL #3   Title  increase lumbar ROM 25%    Time  8    Period  Weeks    Status  New      PT LONG TERM GOAL #4   Title  may return to walking for exercise or her gym at her apartment    Time  8    Period  Weeks    Status  New            Plan - 04/16/17 1518    Clinical  Impression Statement  Pt continues to do well with all exercises, good HS flexibility in both LE. Piriformis are a little tight, Pt tends to lean back on hip flexors when standing. Postural cues throughout treatment.    Rehab Potential  Fair    PT Frequency  2x / week    PT Duration  8 weeks    PT Next Visit Plan  will resume PT trying to achieve her prior level of function and less pain with her ADL's, PT without back brace. core strength       Patient will benefit from skilled therapeutic intervention in order to improve the following deficits and impairments:  Pain, Postural dysfunction, Improper body mechanics, Decreased mobility, Decreased strength, Decreased range of motion, Decreased endurance, Increased fascial restricitons, Increased muscle spasms, Decreased activity tolerance, Impaired UE functional use  Visit Diagnosis: Stiffness of right shoulder, not elsewhere classified  Abnormal posture  Chronic right shoulder pain  Cervicalgia     Problem List Patient Active Problem List   Diagnosis Date Noted  . MCI (mild cognitive impairment) 10/23/2016  . Chronic cough 10/19/2016  . Moderate single current episode of major depressive disorder ([place]) 12/20/2015  . Other seasonal allergic rhinitis 03/31/2015  . 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 Muscle tension headache 10/06/2014  . Depression (emotion) 10/06/2014  . Dementia arising in the senium and presenium 10/06/2014  . Hypersomnia with sleep apnea 10/06/2014  . Neuropathy 07/08/2012  . Back pain 07/08/2012  . HYPERCHOLESTEROLEMIA 04/06/2010  . OVERWEIGHT 04/05/2010  . SKIN RASH 03/08/2010  . VERTIGO 05/06/2009  . CONJUNCTIVITIS, ALLERGIC 02/03/2009  . DOE (dyspnea on exertion) 11/03/2008  . SLEEP APNEA, OBSTRUCTIVE, MILD 07/28/2008  . VITAMIN D DEFICIENCY  06/25/2008  . FATIGUE 06/23/2008  . EDEMA 06/23/2008  . ALLERGIC RHINITIS 05/10/2008  . SHOULDER PAIN, RIGHT 04/22/2008  . NECK PAIN 10/27/2007  .  REACTIVE AIRWAY DISEASE 10/01/2007  . PELVIC  PAIN 10/01/2007  . 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 POSTMENOPAUSAL STATUS 08/18/2007  . SCOLIOSIS, LUMBAR SPINE 08/18/2007  . VARICOSE VEINS, LOWER EXTREMITIES 07/28/2007  . GERD 07/28/2007  . 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 LUMBAGO 07/28/2007  . POSITIVE PPD 05/19/2007    Scot Jun, PTA 04/16/2017, 3:19 PM  [place], [place], [address] Phone: [phone]   Fax:  [phone]  Name: Kayla Davies MRN: [id number] Date of Birth: [birth year]

## 2017-04-17 ENCOUNTER — Ambulatory Visit (INDEPENDENT_AMBULATORY_CARE_PROVIDER_SITE_OTHER): Payer: Medicaid Other | Admitting: Cardiology

## 2017-04-17 ENCOUNTER — Encounter: Payer: Self-pay | Admitting: Cardiology

## 2017-04-17 VITALS — BP 126/90 | HR 73 | Ht 65.0 in | Wt 147.0 lb

## 2017-04-17 DIAGNOSIS — E782 Mixed hyperlipidemia: Secondary | ICD-10-CM | POA: Diagnosis not present

## 2017-04-17 DIAGNOSIS — R072 Precordial pain: Secondary | ICD-10-CM | POA: Diagnosis not present

## 2017-04-17 DIAGNOSIS — R0609 Other forms of dyspnea: Secondary | ICD-10-CM

## 2017-04-17 DIAGNOSIS — R0600 Dyspnea, unspecified: Secondary | ICD-10-CM

## 2017-04-17 MED ORDER — RED YEAST RICE 600 MG PO CAPS: 600.0000 mg | ORAL_CAPSULE | Freq: Every day | ORAL | Status: AC

## 2017-04-17 MED ORDER — LANSOPRAZOLE 15 MG PO CPDR: 15.0000 mg | DELAYED_RELEASE_CAPSULE | Freq: Every day | ORAL | Status: AC

## 2017-04-17 MED ORDER — FISH OIL 1000 MG PO CPDR: 2.0000 g | DELAYED_RELEASE_CAPSULE | Freq: Every day | ORAL | Status: AC

## 2017-04-17 NOTE — Patient Instructions (Signed)
Your physician has recommended you make the following change in your medication:  1.) start red yeast rice 600 mg once a day 2.) start lansoprazole 15 mg once a day 3.) start fish oil 1000 mg --2 tablets by mouth daily  Your physician recommends that you schedule a follow-up appointment as needed with Dr. Delton SeeNelson.

## 2017-04-17 NOTE — Progress Notes (Signed)
Patient Name: Kayla Davies Date of Encounter: 04/17/2017  Primary Care Provider:  Gwenyth Bender, MD Primary Cardiologist:  Dr Delton See, new patient   Problem List   Past Medical History:  Diagnosis Date  . Allergic rhinitis, cause unspecified   . Alzheimer's dementia   . Arthropathy, unspecified, other specified sites   . Asymptomatic varicose veins   . Bladder incontinence   . Bowel incontinence   . Bronchitis   . Cervicalgia   . Edema   . Esophageal reflux   . Lumbago   . Obstructive sleep apnea (adult) (pediatric)   . Other malaise and fatigue   . Pain in joint, shoulder region   . Radiculopathy of lumbar region   . Reactive airway disease   . Scoliosis (and kyphoscoliosis), idiopathic   . 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 Unspecified asthma(493.90)   . Unspecified disorder of lipoid metabolism   . Unspecified vitamin D deficiency   . Vertigo    Past Surgical History:  Procedure Laterality Date  . NECK SURGERY      Allergies  No Known Allergies  HPI  Very pleasant 62 year old female, originally from [place], communicating with the help of interpreter who is coming for concerns of chest pain. She has been experiencing exertional chest pains that feel like burning in her mid epigastrium and radiating to her back, they happen with housework, and have been happening more frequently, but rarely happen at rest. She denies any palpitations or syncope. No lower extremity edema no orthopnea paroxysmal nocturnal dyspnea or claudications. She has never smoked and has no family members with premature coronary artery disease. She has significant hyperlipidemia with LDL of 177 in the past that improved with diet modification however her LDL is still 117 and triglycerides 178. HDL 54. She takes no medications. She is being followed by pulmonary for chronic cough, she underwent chest CT last months with no significant abnormalities.  04/17/2017, the patient is coming for follow-up,  she continues to have chest pain, mostly after eating not with exertion. No dyspnea on exertion no dizziness or syncope. She is coming for results of her coronary CTA.   Home Medications  Prior to Admission medications   Medication Sig Start Date End Date Taking? Authorizing Provider  albuterol (PROVENTIL HFA) 108 (90 BASE) MCG/ACT inhaler Inhale 2 puffs into the lungs every 4 (four) hours as needed. For cough or SOB   Yes [provider]  benzonatate (TESSALON) 200 MG capsule Take 1 capsule (200 mg total) by mouth 3 (three) times daily as needed for cough. 11/16/16  Yes Parrett, Tammy S, NP  budesonide (PULMICORT FLEXHALER) 180 MCG/ACT inhaler Inhale 1 puff into the lungs daily. 01/04/17  Yes Oretha Milch, MD  cetirizine (ZYRTEC) 10 MG tablet Take 1 tablet (10 mg total) by mouth daily as needed for allergies. 06/16/12  Yes Moreno-Coll, Adlih, MD  DEXILANT 60 MG capsule TK 1 C PO QD 07/29/14  Yes [provider]  meloxicam (MOBIC) 7.5 MG tablet 1-2 tabs by mouth daily with food as needed for pain. Do not take ibuprofen with this medicine. 06/24/12  Yes Acey Lav, MD  methocarbamol (ROBAXIN) 500 MG tablet Take 1 tablet (500 mg total) by mouth 4 (four) times daily. 10/24/16  Yes Renne Crigler, PA-C    Family History  History reviewed. No pertinent family history.  Social History  Social History   Socioeconomic History  . Marital status: Single    Spouse name: Not  on file  . Number of children: 3  . Years of education: 0  . Highest education level: Not on file  Social Needs  . Financial resource strain: Not on file  . Food insecurity - worry: Not on file  . Food insecurity - inability: Not on file  . Transportation needs - medical: Not on file  . Transportation needs - non-medical: Not on file  Occupational History  . Occupation: unemployed  Tobacco Use  . Smoking status: Never Smoker  . Smokeless tobacco: Never Used  Substance and Sexual Activity  .  Alcohol use: No  . Drug use: No  . Sexual activity: No  Other Topics Concern  . Not on file  Social History Narrative   Lives at home with daughter     Review of Systems, as per HPI, otherwise negative General:  No chills, fever, night sweats or weight changes.  Cardiovascular:  No chest pain, dyspnea on exertion, edema, orthopnea, palpitations, paroxysmal nocturnal dyspnea. Dermatological: No rash, lesions/masses Respiratory: No cough, dyspnea Urologic: No hematuria, dysuria Abdominal:   No nausea, vomiting, diarrhea, bright red blood per rectum, melena, or hematemesis Neurologic:  No visual changes, wkns, changes in mental status. All other systems reviewed and are otherwise negative except as noted above.  Physical Exam  Blood pressure 126/90, pulse 73, height 5\' 5"  (1.651 m), weight 147 lb (66.7 kg), SpO2 99 %.  General: Pleasant, NAD Psych: Normal affect. Neuro: Alert and oriented X 3. Moves all extremities spontaneously. HEENT: Normal  Neck: Supple without bruits or JVD. Lungs:  Resp regular and unlabored, CTA. Heart: RRR no s3, s4, or murmurs. Abdomen: Soft, non-tender, non-distended, BS + x 4.  Extremities: No clubbing, cyanosis or edema. DP/PT/Radials 2+ and equal bilaterally.  Labs:  No results for input(s): CKTOTAL, CKMB, TROPONINI in the last 72 hours. Lab Results  Component Value Date   WBC 3.9 (L) 10/19/2016   HGB 13.4 10/19/2016   HCT 40.3 10/19/2016   MCV 89.0 10/19/2016   PLT 255.0 10/19/2016    No results found for: DDIMER Invalid input(s): POCBNP    Component Value Date/Time   NA 140 04/05/2017 1432   K 4.0 04/05/2017 1432   CL 100 04/05/2017 1432   CO2 27 04/05/2017 1432   GLUCOSE 107 (H) 04/05/2017 1432   GLUCOSE 85 10/19/2016 1321   BUN 9 04/05/2017 1432   CREATININE 0.65 04/05/2017 1432   CALCIUM 9.1 04/05/2017 1432   PROT 7.2 12/18/2011 1500   ALBUMIN 3.9 12/18/2011 1500   AST 18 12/18/2011 1500   ALT 13 12/18/2011 1500   ALKPHOS 66  12/18/2011 1500   BILITOT 0.2 (L) 12/18/2011 1500   GFRNONAA 95 04/05/2017 1432   GFRAA 109 04/05/2017 1432   Lab Results  Component Value Date   CHOL 252 (H) 07/08/2012   HDL 44 07/08/2012   LDLCALC 168 (H) 07/08/2012   TRIG 200 (H) 07/08/2012    Accessory Clinical Findings  Echocardiogram - none   ECG - normal sinus rhythm with anterolateral infarct age undetermined. This is unchanged from prior EKG in 2009.  Coronary CTA - 04/15/2017   1. Coronary calcium score of 0. This was 0 percentile for age and sex matched control.  2. Normal coronary origin with right dominance.  3.  The study s affected by motion, however there is no CAD.    Assessment & Plan  1.  Chest pain - the patient underwent CTA that showed calcium score of 0 and normal coronaries  with no evidence of coronary artery disease. Her symptoms are atypical sounding more like possible gastric reflux, I will start her on Prilosec 20 mg daily.  2. Hyperlipidemia - elevated LDL and triglycerides, since she has no evidence for coronary artery disease, I will start her on fish oil 2 g daily and red yeast rice 600 mg daily.  Follow-up as needed.  Tobias AlexanderKatarina Kassadee Carawan, MD, [facility] 04/17/2017, 3:00 PM

## 2017-04-18 ENCOUNTER — Telehealth: Payer: Self-pay | Admitting: Cardiology

## 2017-04-18 ENCOUNTER — Ambulatory Visit: Payer: Medicaid Other | Admitting: Physical Therapy

## 2017-04-18 ENCOUNTER — Encounter: Payer: Self-pay | Admitting: Physical Therapy

## 2017-04-18 DIAGNOSIS — M25511 Pain in right shoulder: Secondary | ICD-10-CM

## 2017-04-18 DIAGNOSIS — R293 Abnormal posture: Secondary | ICD-10-CM

## 2017-04-18 DIAGNOSIS — M25611 Stiffness of right shoulder, not elsewhere classified: Secondary | ICD-10-CM

## 2017-04-18 DIAGNOSIS — G8929 Other chronic pain: Secondary | ICD-10-CM

## 2017-04-18 NOTE — Therapy (Signed)
[place], [place], [address] Phone: [phone]   Fax:  [phone]  Physical Therapy Treatment  Patient Details  Name: Kayla Davies MRN: [id number] Date of Birth: April 07, 1955 Referring Provider: Kevan Ny   Encounter Date: 04/18/2017  PT End of Session - 04/18/17 1518    Visit Number  11    Date for PT Re-Evaluation  05/06/17    PT Start Time  1430    PT Stop Time  1530    PT Time Calculation (min)  60 min    Activity Tolerance  Patient tolerated treatment well    Behavior During Therapy  [facility] for tasks assessed/performed       Past Medical History:  Diagnosis Date  . Allergic rhinitis, cause unspecified   . Alzheimer's dementia   . Arthropathy, unspecified, other specified sites   . Asymptomatic varicose veins   . Bladder incontinence   . Bowel incontinence   . Bronchitis   . Cervicalgia   . Edema   . Esophageal reflux   . Lumbago   . Obstructive sleep apnea (adult) (pediatric)   . Other malaise and fatigue   . Pain in joint, shoulder region   . Radiculopathy of lumbar region   . Reactive airway disease   . Scoliosis (and kyphoscoliosis), idiopathic   . 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 Unspecified asthma(493.90)   . Unspecified disorder of lipoid metabolism   . Unspecified vitamin D deficiency   . Vertigo     Past Surgical History:  Procedure Laterality Date  . NECK SURGERY      There were no vitals filed for this visit.  Subjective Assessment - 04/18/17 1434    Subjective  "Good"    Currently in Pain?  Yes    Pain Score  -- "so so"                      [facility] Adult PT Treatment/Exercise - 04/18/17 0001      Neck Exercises: Machines for Strengthening   UBE (Upper Arm Bike)  L4 8fd/3rev    Other Machines for Strengthening  Nustep level 5 x 6 minutes      Neck Exercises: Theraband   Scapula Retraction  15 reps      Lumbar Exercises: Machines for  Strengthening   Cybex Knee Extension  5lb 2x10    Cybex Knee Flexion  25lb 2x10    Leg Press  20lb 3x10      Lumbar Exercises: Standing   Shoulder Extension  Theraband;20 reps;Both    Theraband Level (Shoulder Extension)  Level 3 (Green)      Lumbar Exercises: Seated   Other Seated Lumbar Exercises  Rows, and lats 20lb 2x10       Moist Heat Therapy   Number Minutes Moist Heat  15 Minutes    Moist Heat Location  Shoulder;Cervical;Lumbar Spine      Electrical Stimulation   Electrical Stimulation Location  R shoulder, low back    Electrical Stimulation Action  pre mod    Electrical Stimulation Parameters  supine    Electrical Stimulation Goals  Pain               PT Short Term Goals - 01/25/17 1041      PT SHORT TERM GOAL #1   Title  pt to be I with inital HEP     Time  3    Period  Weeks    Status  Achieved      PT SHORT TERM GOAL #2   Title  verbalize and demo proper posture to prevent and reduce low back, neck and shoulder pain     Baseline  continued cues for proper form    Time  3    Period  Weeks    Status  Partially Met        PT Long Term Goals - 03/08/17 8921      PT LONG TERM GOAL #1   Title  pt to increase R shoulder AROM to 100 degrees for better ADL's    Time  8    Period  Weeks    Status  New      PT LONG TERM GOAL #2   Title  increase R shoulder strength to 4-/5 without pain > 4/10    Time  8    Period  Weeks    Status  New      PT LONG TERM GOAL #3   Title  increase lumbar ROM 25%    Time  8    Period  Weeks    Status  New      PT LONG TERM GOAL #4   Title  may return to walking for exercise or her gym at her apartment    Time  8    Period  Weeks    Status  New            Plan - 04/18/17 1518    Clinical Impression Statement  no interpreter so continues with interventions pt was use to. No visual reports of pain reading pt's facial expressions. Was able to demonstrate appropriate posture for seated rows and standing  shoulder extensions.    PT Frequency  2x / week    PT Duration  8 weeks    PT Treatment/Interventions  ADLs/Self Care Home Management;Electrical Stimulation;Cryotherapy;Iontophoresis 19m/ml Dexamethasone;Moist Heat;Ultrasound;Therapeutic exercise;Therapeutic activities;Dry needling;Taping;Manual techniques;Neuromuscular re-education;Passive range of motion;Patient/family education    PT Next Visit Plan  will resume PT trying to achieve her prior level of function and less pain with her ADL's, PT without back brace. core strength       Patient will benefit from skilled therapeutic intervention in order to improve the following deficits and impairments:  Pain, Postural dysfunction, Improper body mechanics, Decreased mobility, Decreased strength, Decreased range of motion, Decreased endurance, Increased fascial restricitons, Increased muscle spasms, Decreased activity tolerance, Impaired UE functional use  Visit Diagnosis: Abnormal posture  Stiffness of right shoulder, not elsewhere classified  Chronic right shoulder pain     Problem List Patient Active Problem List   Diagnosis Date Noted  . MCI (mild cognitive impairment) 10/23/2016  . Chronic cough 10/19/2016  . Moderate single current episode of major depressive disorder ([place] 12/20/2015  . Other seasonal allergic rhinitis 03/31/2015  . 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 Muscle tension headache 10/06/2014  . Depression (emotion) 10/06/2014  . Dementia arising in the senium and presenium 10/06/2014  . Hypersomnia with sleep apnea 10/06/2014  . Neuropathy 07/08/2012  . Back pain 07/08/2012  . HYPERCHOLESTEROLEMIA 04/06/2010  . OVERWEIGHT 04/05/2010  . SKIN RASH 03/08/2010  . VERTIGO 05/06/2009  . CONJUNCTIVITIS, ALLERGIC 02/03/2009  . DOE (dyspnea on exertion) 11/03/2008  . SLEEP APNEA, OBSTRUCTIVE, MILD 07/28/2008  . VITAMIN D DEFICIENCY 06/25/2008  . FATIGUE 06/23/2008  . EDEMA 06/23/2008  . ALLERGIC RHINITIS  05/10/2008  . SHOULDER PAIN, RIGHT 04/22/2008  . NECK PAIN 10/27/2007  .  REACTIVE AIRWAY DISEASE 10/01/2007  . PELVIC  PAIN 10/01/2007  . 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 POSTMENOPAUSAL STATUS 08/18/2007  . SCOLIOSIS, LUMBAR SPINE 08/18/2007  . VARICOSE VEINS, LOWER EXTREMITIES 07/28/2007  . GERD 07/28/2007  . 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 LUMBAGO 07/28/2007  . POSITIVE PPD 05/19/2007    Scot Jun, PTA 04/18/2017, 3:20 PM  [place], [place], [address] Phone: [phone]   Fax:  [phone]  Name: SAHAANA WEITMAN MRN: [id number] Date of Birth: 10-31-1955

## 2017-04-18 NOTE — Telephone Encounter (Signed)
Notes recorded by Lars Masson, MD on 04/17/2017 at 12:26 PM EDT No coronary artery disease, this is a normal study  Pt aware of normal coronary ct results per Dr Delton See. Pt would like a copy of this to be mailed to her address. Informed her that I will have our medical records Rep to call her back to have this arranged. Pt verbalized understanding and agrees with this plan.

## 2017-04-18 NOTE — Telephone Encounter (Signed)
New message    [facility]

## 2017-04-23 ENCOUNTER — Ambulatory Visit
Admission: RE | Admit: 2017-04-23 | Discharge: 2017-04-23 | Disposition: A | Discharge status: Home / Self Care | Payer: Medicaid Other | Source: Ambulatory Visit | Attending: Internal Medicine | Admitting: Internal Medicine

## 2017-04-23 ENCOUNTER — Ambulatory Visit: Payer: Medicaid Other | Admitting: Physical Therapy

## 2017-04-23 DIAGNOSIS — M25611 Stiffness of right shoulder, not elsewhere classified: Secondary | ICD-10-CM

## 2017-04-23 DIAGNOSIS — M542 Cervicalgia: Secondary | ICD-10-CM

## 2017-04-23 DIAGNOSIS — M62830 Muscle spasm of back: Secondary | ICD-10-CM

## 2017-04-23 DIAGNOSIS — M545 Low back pain: Secondary | ICD-10-CM

## 2017-04-23 DIAGNOSIS — R293 Abnormal posture: Secondary | ICD-10-CM

## 2017-04-23 DIAGNOSIS — G8929 Other chronic pain: Secondary | ICD-10-CM

## 2017-04-23 DIAGNOSIS — Z1231 Encounter for screening mammogram for malignant neoplasm of breast: Secondary | ICD-10-CM

## 2017-04-23 DIAGNOSIS — M25511 Pain in right shoulder: Secondary | ICD-10-CM

## 2017-04-23 NOTE — Therapy (Signed)
[place], [place], [address] Phone: [phone]   Fax:  [phone]  Physical Therapy Treatment  Patient Details  Name: Kayla Davies MRN: [id number] Date of Birth: 04-Mar-1955 Referring Provider: Kevan Ny   Encounter Date: 04/23/2017  PT End of Session - 04/23/17 0835    Visit Number  12    Date for PT Re-Evaluation  05/06/17    PT Start Time  0806    PT Stop Time  0903    PT Time Calculation (min)  57 min       Past Medical History:  Diagnosis Date  . Allergic rhinitis, cause unspecified   . Alzheimer's dementia   . Arthropathy, unspecified, other specified sites   . Asymptomatic varicose veins   . Bladder incontinence   . Bowel incontinence   . Bronchitis   . Cervicalgia   . Edema   . Esophageal reflux   . Lumbago   . Obstructive sleep apnea (adult) (pediatric)   . Other malaise and fatigue   . Pain in joint, shoulder region   . Radiculopathy of lumbar region   . Reactive airway disease   . Scoliosis (and kyphoscoliosis), idiopathic   . 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 Unspecified asthma(493.90)   . Unspecified disorder of lipoid metabolism   . Unspecified vitamin D deficiency   . Vertigo     Past Surgical History:  Procedure Laterality Date  . NECK SURGERY      There were no vitals filed for this visit.  Subjective Assessment - 04/23/17 0807    Subjective  6 min late. 60-70% better    Patient is accompained by:  Interpreter    Currently in Pain?  Yes    Pain Score  4     Pain Location  Shoulder    Pain Orientation  Right                No data recorded       [facility] Adult PT Treatment/Exercise - 04/23/17 0001      Neck Exercises: Machines for Strengthening   UBE (Upper Arm Bike)  L4 62fd/3rev    Other Machines for Strengthening  Nustep level 5 x 6 minutes      Neck Exercises: Theraband   Scapula Retraction  15 reps;Green    Shoulder Extension  15  reps;Green    Shoulder ADduction  15 reps;Red    Shoulder External Rotation  15 reps;Red      Lumbar Exercises: Machines for Strengthening   Cybex Lumbar Extension  black band 2 sets 10 trunk flex 2 sets 10    Cybex Knee Extension  5lb 2x10    Cybex Knee Flexion  25lb 2x10    Leg Press  20lb 3x10      Lumbar Exercises: Standing   Other Standing Lumbar Exercises  wt ball trunk ext and obl 15 each      Moist Heat Therapy   Number Minutes Moist Heat  15 Minutes    Moist Heat Location  Shoulder;Cervical;Lumbar Spine      Electrical Stimulation   Electrical Stimulation Location  R shoulder, low back    Electrical Stimulation Action  premod    Electrical Stimulation Parameters  supine    Electrical Stimulation Goals  Pain               PT Short Term Goals - 01/25/17 1041      PT SHORT  TERM GOAL #1   Title  pt to be I with inital HEP     Time  3    Period  Weeks    Status  Achieved      PT SHORT TERM GOAL #2   Title  verbalize and demo proper posture to prevent and reduce low back, neck and shoulder pain     Baseline  continued cues for proper form    Time  3    Period  Weeks    Status  Partially Met        PT Long Term Goals - 04/23/17 0810      PT LONG TERM GOAL #1   Title  pt to increase R shoulder AROM to 100 degrees for better ADL's    Baseline  Rt shld flex standing 142    Status  Achieved      PT LONG TERM GOAL #2   Title  increase R shoulder strength to 4-/5 without pain > 4/10    Baseline  4-/5 but c/o pain    Status  Partially Met      PT LONG TERM GOAL #3   Title  increase lumbar ROM 25%    Baseline  WFLs     Status  Achieved      PT LONG TERM GOAL #4   Title  may return to walking for exercise or her gym at her apartment    Status  On-going            Plan - 04/23/17 1062    Clinical Impression Statement  progressing with LTGS. ROM goals met. pt getting stronger but still c/o pain with resistsance and some ex. postural cuing needed  thru out session    PT Treatment/Interventions  ADLs/Self Care Home Management;Electrical Stimulation;Cryotherapy;Iontophoresis 2m/ml Dexamethasone;Moist Heat;Ultrasound;Therapeutic exercise;Therapeutic activities;Dry needling;Taping;Manual techniques;Neuromuscular re-education;Passive range of motion;Patient/family education    PT Next Visit Plan  start preparing for D/C and transition back to gym apartment ex       Patient will benefit from skilled therapeutic intervention in order to improve the following deficits and impairments:  Pain, Postural dysfunction, Improper body mechanics, Decreased mobility, Decreased strength, Decreased range of motion, Decreased endurance, Increased fascial restricitons, Increased muscle spasms, Decreased activity tolerance, Impaired UE functional use  Visit Diagnosis: Abnormal posture  Stiffness of right shoulder, not elsewhere classified  Chronic right shoulder pain  Cervicalgia  Muscle spasm of back  Bilateral low back pain, unspecified chronicity, with sciatica presence unspecified     Problem List Patient Active Problem List   Diagnosis Date Noted  . MCI (mild cognitive impairment) 10/23/2016  . Chronic cough 10/19/2016  . Moderate single current episode of major depressive disorder ([place] 12/20/2015  . Other seasonal allergic rhinitis 03/31/2015  . 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 Muscle tension headache 10/06/2014  . Depression (emotion) 10/06/2014  . Dementia arising in the senium and presenium 10/06/2014  . Hypersomnia with sleep apnea 10/06/2014  . Neuropathy 07/08/2012  . Back pain 07/08/2012  . HYPERCHOLESTEROLEMIA 04/06/2010  . OVERWEIGHT 04/05/2010  . SKIN RASH 03/08/2010  . VERTIGO 05/06/2009  . CONJUNCTIVITIS, ALLERGIC 02/03/2009  . DOE (dyspnea on exertion) 11/03/2008  . SLEEP APNEA, OBSTRUCTIVE, MILD 07/28/2008  . VITAMIN D DEFICIENCY 06/25/2008  . FATIGUE 06/23/2008  . EDEMA 06/23/2008  . ALLERGIC RHINITIS  05/10/2008  . SHOULDER PAIN, RIGHT 04/22/2008  . NECK PAIN 10/27/2007  . REACTIVE AIRWAY DISEASE 10/01/2007  . PELVIC  PAIN 10/01/2007  . ARTHRITIS,  KNEES, BILATERAL 09/01/2007  . POSTMENOPAUSAL STATUS 08/18/2007  . SCOLIOSIS, LUMBAR SPINE 08/18/2007  . VARICOSE VEINS, LOWER EXTREMITIES 07/28/2007  . GERD 07/28/2007  . 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 LUMBAGO 07/28/2007  . POSITIVE PPD 05/19/2007    ,ANGIE PTA 04/23/2017, 8:39 AM  [place], [place], [address] Phone: [phone]   Fax:  [phone]  Name: Kayla Davies MRN: [id number] Date of Birth: May 03, 1955

## 2017-04-25 ENCOUNTER — Ambulatory Visit: Payer: Medicaid Other | Admitting: Physical Therapy

## 2017-04-25 ENCOUNTER — Encounter: Payer: Self-pay | Admitting: Physical Therapy

## 2017-04-25 DIAGNOSIS — R293 Abnormal posture: Secondary | ICD-10-CM

## 2017-04-25 DIAGNOSIS — M25611 Stiffness of right shoulder, not elsewhere classified: Secondary | ICD-10-CM

## 2017-04-25 DIAGNOSIS — M25511 Pain in right shoulder: Secondary | ICD-10-CM

## 2017-04-25 DIAGNOSIS — G8929 Other chronic pain: Secondary | ICD-10-CM

## 2017-04-25 NOTE — Therapy (Signed)
[place], [place], [address] Phone: [phone]   Fax:  [phone]  Physical Therapy Treatment  Patient Details  Name: Kayla Davies MRN: [id number] Date of Birth: [birth year] Referring Provider: Kevan Ny   Encounter Date: 04/25/2017  PT End of Session - 04/25/17 0936    Visit Number  13    Date for PT Re-Evaluation  05/06/17    PT Start Time  0845    PT Stop Time  0946    PT Time Calculation (min)  61 min    Activity Tolerance  Patient tolerated treatment well    Behavior During Therapy  [facility] for tasks assessed/performed       Past Medical History:  Diagnosis Date  . Allergic rhinitis, cause unspecified   . Alzheimer's dementia   . Arthropathy, unspecified, other specified sites   . Asymptomatic varicose veins   . Bladder incontinence   . Bowel incontinence   . Bronchitis   . Cervicalgia   . Edema   . Esophageal reflux   . Lumbago   . Obstructive sleep apnea (adult) (pediatric)   . Other malaise and fatigue   . Pain in joint, shoulder region   . Radiculopathy of lumbar region   . Reactive airway disease   . Scoliosis (and kyphoscoliosis), idiopathic   . 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 Unspecified asthma(493.90)   . Unspecified disorder of lipoid metabolism   . Unspecified vitamin D deficiency   . Vertigo     Past Surgical History:  Procedure Laterality Date  . NECK SURGERY      There were no vitals filed for this visit.  Subjective Assessment - 04/25/17 0850    Subjective  "ok"    Currently in Pain?  Yes    Pain Score  5     Pain Location  Shoulder                No data recorded       [facility] Adult PT Treatment/Exercise - 04/25/17 0001      Neck Exercises: Machines for Strengthening   UBE (Upper Arm Bike)  L4 60fd/3rev    Other Machines for Strengthening  Nustep level 5 x 6 minutes      Neck Exercises: Theraband   Scapula Retraction  Green;20 reps     Shoulder Extension  Green;20 reps    Shoulder External Rotation  Red;20 reps      Lumbar Exercises: Machines for Strengthening   Cybex Knee Extension  5lb 2x10    Cybex Knee Flexion  25lb 2x10    Leg Press  20lb 3x10      Lumbar Exercises: Standing   Other Standing Lumbar Exercises  wt ball trunk ext and obl 15 each      Moist Heat Therapy   Number Minutes Moist Heat  15 Minutes    Moist Heat Location  Shoulder;Cervical;Lumbar Spine      Electrical Stimulation   Electrical Stimulation Location  R shoulder, low back    Electrical Stimulation Action  premod    Electrical Stimulation Parameters  supine    Electrical Stimulation Goals  Pain               PT Short Term Goals - 01/25/17 1041      PT SHORT TERM GOAL #1   Title  pt to be I with inital HEP     Time  3  Period  Weeks    Status  Achieved      PT SHORT TERM GOAL #2   Title  verbalize and demo proper posture to prevent and reduce low back, neck and shoulder pain     Baseline  continued cues for proper form    Time  3    Period  Weeks    Status  Partially Met        PT Long Term Goals - 04/23/17 0810      PT LONG TERM GOAL #1   Title  pt to increase R shoulder AROM to 100 degrees for better ADL's    Baseline  Rt shld flex standing 142    Status  Achieved      PT LONG TERM GOAL #2   Title  increase R shoulder strength to 4-/5 without pain > 4/10    Baseline  4-/5 but c/o pain    Status  Partially Met      PT LONG TERM GOAL #3   Title  increase lumbar ROM 25%    Baseline  WFLs     Status  Achieved      PT LONG TERM GOAL #4   Title  may return to walking for exercise or her gym at her apartment    Status  On-going            Plan - 04/25/17 3734    Clinical Impression Statement  Pt stated that's she is doing better overall but reports constant 4 to 5 pain in her shoulder and back. Postural cueing needed through session. Cues to perform full ROM with seated exercises.    Rehab  Potential  Fair    PT Frequency  2x / week    PT Duration  8 weeks    PT Treatment/Interventions  ADLs/Self Care Home Management;Electrical Stimulation;Cryotherapy;Iontophoresis 19m/ml Dexamethasone;Moist Heat;Ultrasound;Therapeutic exercise;Therapeutic activities;Dry needling;Taping;Manual techniques;Neuromuscular re-education;Passive range of motion;Patient/family education    PT Next Visit Plan  start preparing for D/C and transition back to gym apartment ex       Patient will benefit from skilled therapeutic intervention in order to improve the following deficits and impairments:  Pain, Postural dysfunction, Improper body mechanics, Decreased mobility, Decreased strength, Decreased range of motion, Decreased endurance, Increased fascial restricitons, Increased muscle spasms, Decreased activity tolerance, Impaired UE functional use  Visit Diagnosis: Abnormal posture  Stiffness of right shoulder, not elsewhere classified  Chronic right shoulder pain     Problem List Patient Active Problem List   Diagnosis Date Noted  . MCI (mild cognitive impairment) 10/23/2016  . Chronic cough 10/19/2016  . Moderate single current episode of major depressive disorder ([place] 12/20/2015  . Other seasonal allergic rhinitis 03/31/2015  . 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 Muscle tension headache 10/06/2014  . Depression (emotion) 10/06/2014  . Dementia arising in the senium and presenium 10/06/2014  . Hypersomnia with sleep apnea 10/06/2014  . Neuropathy 07/08/2012  . Back pain 07/08/2012  . HYPERCHOLESTEROLEMIA 04/06/2010  . OVERWEIGHT 04/05/2010  . SKIN RASH 03/08/2010  . VERTIGO 05/06/2009  . CONJUNCTIVITIS, ALLERGIC 02/03/2009  . DOE (dyspnea on exertion) 11/03/2008  . SLEEP APNEA, OBSTRUCTIVE, MILD 07/28/2008  . VITAMIN D DEFICIENCY 06/25/2008  . FATIGUE 06/23/2008  . EDEMA 06/23/2008  . ALLERGIC RHINITIS 05/10/2008  . SHOULDER PAIN, RIGHT 04/22/2008  . NECK PAIN 10/27/2007  .  REACTIVE AIRWAY DISEASE 10/01/2007  . PELVIC  PAIN 10/01/2007  . 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 POSTMENOPAUSAL STATUS 08/18/2007  .  SCOLIOSIS, LUMBAR SPINE 08/18/2007  . VARICOSE VEINS, LOWER EXTREMITIES 07/28/2007  . GERD 07/28/2007  . 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 LUMBAGO 07/28/2007  . POSITIVE PPD 05/19/2007    Scot Jun, PTA 04/25/2017, 9:39 AM  [place], [place], [address] Phone: [phone]   Fax:  [phone]  Name: Kayla Davies MRN: [id number] Date of Birth: July 05, 1955

## 2017-04-30 ENCOUNTER — Ambulatory Visit: Payer: Medicaid Other | Attending: Internal Medicine | Admitting: Physical Therapy

## 2017-04-30 ENCOUNTER — Encounter: Payer: Self-pay | Admitting: Physical Therapy

## 2017-04-30 DIAGNOSIS — M545 Low back pain: Secondary | ICD-10-CM | POA: Insufficient documentation

## 2017-04-30 DIAGNOSIS — R293 Abnormal posture: Secondary | ICD-10-CM | POA: Diagnosis not present

## 2017-04-30 DIAGNOSIS — M62830 Muscle spasm of back: Secondary | ICD-10-CM

## 2017-04-30 DIAGNOSIS — G8929 Other chronic pain: Secondary | ICD-10-CM

## 2017-04-30 DIAGNOSIS — M25511 Pain in right shoulder: Secondary | ICD-10-CM | POA: Insufficient documentation

## 2017-04-30 DIAGNOSIS — M542 Cervicalgia: Secondary | ICD-10-CM | POA: Diagnosis present

## 2017-04-30 DIAGNOSIS — M25611 Stiffness of right shoulder, not elsewhere classified: Secondary | ICD-10-CM | POA: Diagnosis present

## 2017-04-30 NOTE — Therapy (Signed)
[place], [place], [address] Phone: [phone]   Fax:  [phone]  Physical Therapy Treatment  Patient Details  Name: Kayla Davies MRN: [id number] Date of Birth: [birth year] Referring Provider: Kevan Ny   Encounter Date: 04/30/2017  PT End of Session - 04/30/17 1145    Visit Number  14    Date for PT Re-Evaluation  05/06/17    Authorization Time Period  01/27/17    PT Start Time  1110    PT Stop Time  1154    PT Time Calculation (min)  44 min    Activity Tolerance  Patient tolerated treatment well    Behavior During Therapy  [facility] for tasks assessed/performed       Past Medical History:  Diagnosis Date  . Allergic rhinitis, cause unspecified   . Alzheimer's dementia   . Arthropathy, unspecified, other specified sites   . Asymptomatic varicose veins   . Bladder incontinence   . Bowel incontinence   . Bronchitis   . Cervicalgia   . Edema   . Esophageal reflux   . Lumbago   . Obstructive sleep apnea (adult) (pediatric)   . Other malaise and fatigue   . Pain in joint, shoulder region   . Radiculopathy of lumbar region   . Reactive airway disease   . Scoliosis (and kyphoscoliosis), idiopathic   . 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 Unspecified asthma(493.90)   . Unspecified disorder of lipoid metabolism   . Unspecified vitamin D deficiency   . Vertigo     Past Surgical History:  Procedure Laterality Date  . NECK SURGERY      There were no vitals filed for this visit.  Subjective Assessment - 04/30/17 1113    Subjective  "I am ok"    Currently in Pain?  Yes    Pain Score  4     Pain Location  -- back and shoulder                       [facility] Adult PT Treatment/Exercise - 04/30/17 0001      Neck Exercises: Machines for Strengthening   UBE (Upper Arm Bike)  L4 53fd/3rev      Neck Exercises: Standing   Other Standing Exercises  horiz abd red tband 2x10       Lumbar Exercises: Machines for Strengthening   Cybex Knee Extension  10lb 2x10    Cybex Knee Flexion  25lb 2x10    Leg Press  20lb 3x10    Other Lumbar Machine Exercise  Rows & lats 2x10 20lb      Lumbar Exercises: Standing   Shoulder Extension  Theraband;20 reps;Both    Theraband Level (Shoulder Extension)  Level 2 (Red)      Moist Heat Therapy   Number Minutes Moist Heat  10 Minutes    Moist Heat Location  Cervical;Shoulder               PT Short Term Goals - 01/25/17 1041      PT SHORT TERM GOAL #1   Title  pt to be I with inital HEP     Time  3    Period  Weeks    Status  Achieved      PT SHORT TERM GOAL #2   Title  verbalize and demo proper posture to prevent and reduce low back, neck and shoulder pain  Baseline  continued cues for proper form    Time  3    Period  Weeks    Status  Partially Met        PT Long Term Goals - 04/23/17 0810      PT LONG TERM GOAL #1   Title  pt to increase R shoulder AROM to 100 degrees for better ADL's    Baseline  Rt shld flex standing 142    Status  Achieved      PT LONG TERM GOAL #2   Title  increase R shoulder strength to 4-/5 without pain > 4/10    Baseline  4-/5 but c/o pain    Status  Partially Met      PT LONG TERM GOAL #3   Title  increase lumbar ROM 25%    Baseline  WFLs     Status  Achieved      PT LONG TERM GOAL #4   Title  may return to walking for exercise or her gym at her apartment    Status  On-going            Plan - 04/30/17 1146    Clinical Impression Statement  Pt ~ 10 minutes late for today's session. No issues reported with today's interventions. Continues to need postural que's throughout session.     Rehab Potential  Fair    PT Frequency  2x / week    PT Duration  8 weeks    PT Treatment/Interventions  ADLs/Self Care Home Management;Electrical Stimulation;Cryotherapy;Iontophoresis 83m/ml Dexamethasone;Moist Heat;Ultrasound;Therapeutic exercise;Therapeutic activities;Dry  needling;Taping;Manual techniques;Neuromuscular re-education;Passive range of motion;Patient/family education    PT Next Visit Plan  start preparing for D/C and transition back to gym apartment ex       Patient will benefit from skilled therapeutic intervention in order to improve the following deficits and impairments:  Pain, Postural dysfunction, Improper body mechanics, Decreased mobility, Decreased strength, Decreased range of motion, Decreased endurance, Increased fascial restricitons, Increased muscle spasms, Decreased activity tolerance, Impaired UE functional use  Visit Diagnosis: Abnormal posture  Stiffness of right shoulder, not elsewhere classified  Chronic right shoulder pain  Muscle spasm of back     Problem List Patient Active Problem List   Diagnosis Date Noted  . MCI (mild cognitive impairment) 10/23/2016  . Chronic cough 10/19/2016  . Moderate single current episode of major depressive disorder ([place] 12/20/2015  . Other seasonal allergic rhinitis 03/31/2015  . 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 Muscle tension headache 10/06/2014  . Depression (emotion) 10/06/2014  . Dementia arising in the senium and presenium 10/06/2014  . Hypersomnia with sleep apnea 10/06/2014  . Neuropathy 07/08/2012  . Back pain 07/08/2012  . HYPERCHOLESTEROLEMIA 04/06/2010  . OVERWEIGHT 04/05/2010  . SKIN RASH 03/08/2010  . VERTIGO 05/06/2009  . CONJUNCTIVITIS, ALLERGIC 02/03/2009  . DOE (dyspnea on exertion) 11/03/2008  . SLEEP APNEA, OBSTRUCTIVE, MILD 07/28/2008  . VITAMIN D DEFICIENCY 06/25/2008  . FATIGUE 06/23/2008  . EDEMA 06/23/2008  . ALLERGIC RHINITIS 05/10/2008  . SHOULDER PAIN, RIGHT 04/22/2008  . NECK PAIN 10/27/2007  . REACTIVE AIRWAY DISEASE 10/01/2007  . PELVIC  PAIN 10/01/2007  . 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 POSTMENOPAUSAL STATUS 08/18/2007  . SCOLIOSIS, LUMBAR SPINE 08/18/2007  . VARICOSE VEINS, LOWER EXTREMITIES 07/28/2007  . GERD  07/28/2007  . 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 LUMBAGO 07/28/2007  . POSITIVE PPD 05/19/2007    RScot Jun PTA 04/30/2017, 11:52 AM  [facility]  [place], [place], [address] Phone: [phone]   Fax:  [phone]  Name: Kayla Davies MRN: [id number] Date of Birth: 16-Sep-1955

## 2017-05-02 ENCOUNTER — Ambulatory Visit: Payer: Medicaid Other | Admitting: Physical Therapy

## 2017-05-03 ENCOUNTER — Ambulatory Visit: Payer: Medicaid Other | Admitting: Physical Therapy

## 2017-05-03 DIAGNOSIS — M25611 Stiffness of right shoulder, not elsewhere classified: Secondary | ICD-10-CM

## 2017-05-03 DIAGNOSIS — G8929 Other chronic pain: Secondary | ICD-10-CM

## 2017-05-03 DIAGNOSIS — M62830 Muscle spasm of back: Secondary | ICD-10-CM

## 2017-05-03 DIAGNOSIS — R293 Abnormal posture: Secondary | ICD-10-CM | POA: Diagnosis not present

## 2017-05-03 DIAGNOSIS — M25511 Pain in right shoulder: Secondary | ICD-10-CM

## 2017-05-03 NOTE — Therapy (Signed)
[place], [place], [address] Phone: [phone]   Fax:  [phone]  Physical Therapy Treatment  Patient Details  Name: Kayla Davies MRN: [id number] Date of Birth: 06-09-1955 Referring Provider: Kevan Ny   Encounter Date: 05/03/2017  PT End of Session - 05/03/17 0901    Visit Number  15    Date for PT Re-Evaluation  05/06/17    PT Start Time  0851    PT Stop Time  0940    PT Time Calculation (min)  49 min       Past Medical History:  Diagnosis Date  . Allergic rhinitis, cause unspecified   . Alzheimer's dementia   . Arthropathy, unspecified, other specified sites   . Asymptomatic varicose veins   . Bladder incontinence   . Bowel incontinence   . Bronchitis   . Cervicalgia   . Edema   . Esophageal reflux   . Lumbago   . Obstructive sleep apnea (adult) (pediatric)   . Other malaise and fatigue   . Pain in joint, shoulder region   . Radiculopathy of lumbar region   . Reactive airway disease   . Scoliosis (and kyphoscoliosis), idiopathic   . 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 Unspecified asthma(493.90)   . Unspecified disorder of lipoid metabolism   . Unspecified vitamin D deficiency   . Vertigo     Past Surgical History:  Procedure Laterality Date  . NECK SURGERY      There were no vitals filed for this visit.  Subjective Assessment - 05/03/17 0855    Subjective  50% better, good and bad days.  ( 6 min late)    Patient is accompained by:  Interpreter    Currently in Pain?  Yes    Pain Score  5     Pain Location  Back    Multiple Pain Sites  Yes    Pain Score  5    Pain Location  Shoulder    Pain Orientation  Right                       [facility] Adult PT Treatment/Exercise - 05/03/17 0001      Neck Exercises: Machines for Strengthening   UBE (Upper Arm Bike)  L4 42fd/2 back    Other Machines for Strengthening  Nustep level 5 x 6 minutes      Lumbar  Exercises: Machines for Strengthening   Cybex Lumbar Extension  black band 2 sets 10 verb and tactile cuing needed    Cybex Knee Extension  10lb 2x10    Cybex Knee Flexion  25lb 2x10    Other Lumbar Machine Exercise  Rows & lats 2x10 20lb postural cuing needed      Lumbar Exercises: Standing   Other Standing Lumbar Exercises  wt ball trunk ext and obl 15 each cuing needed to decrease compensations      Moist Heat Therapy   Number Minutes Moist Heat  10 Minutes    Moist Heat Location  Cervical;Shoulder               PT Short Term Goals - 01/25/17 1041      PT SHORT TERM GOAL #1   Title  pt to be I with inital HEP     Time  3    Period  Weeks    Status  Achieved      PT SHORT TERM GOAL #  2   Title  verbalize and demo proper posture to prevent and reduce low back, neck and shoulder pain     Baseline  continued cues for proper form    Time  3    Period  Weeks    Status  Partially Met        PT Long Term Goals - 05/03/17 0856      PT LONG TERM GOAL #1   Title  pt to increase R shoulder AROM to 100 degrees for better ADL's    Status  Achieved      PT LONG TERM GOAL #2   Title  increase R shoulder strength to 4-/5 without pain > 4/10    Baseline  4/5 BIL UE grossly tested in standing    Status  Achieved      PT LONG TERM GOAL #3   Title  increase lumbar ROM 25%    Status  Achieved      PT LONG TERM GOAL #4   Title  may return to walking for exercise or her gym at her apartment    Baseline  pt verb walking some on nice days, has not returned gym but ex at home    Status  Partially Met            Plan - 05/03/17 0858    Clinical Impression Statement  all goals met except return to gym program and consistant walking. Pt verb pain 5/10 with antalgic mvmt but verb 50% better since [facility]. Still some cuing needed with posture during ther ex, also for control of mvmt    PT Treatment/Interventions  ADLs/Self Care Home Management;Electrical  Stimulation;Cryotherapy;Iontophoresis 37m/ml Dexamethasone;Moist Heat;Ultrasound;Therapeutic exercise;Therapeutic activities;Dry needling;Taping;Manual techniques;Neuromuscular re-education;Passive range of motion;Patient/family education    PT Next Visit Plan  start preparing for D/C and transition back to gym apartment ex       Patient will benefit from skilled therapeutic intervention in order to improve the following deficits and impairments:  Pain, Postural dysfunction, Improper body mechanics, Decreased mobility, Decreased strength, Decreased range of motion, Decreased endurance, Increased fascial restricitons, Increased muscle spasms, Decreased activity tolerance, Impaired UE functional use  Visit Diagnosis: Abnormal posture  Stiffness of right shoulder, not elsewhere classified  Chronic right shoulder pain  Muscle spasm of back     Problem List Patient Active Problem List   Diagnosis Date Noted  . MCI (mild cognitive impairment) 10/23/2016  . Chronic cough 10/19/2016  . Moderate single current episode of major depressive disorder ([place] 12/20/2015  . Other seasonal allergic rhinitis 03/31/2015  . 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 Muscle tension headache 10/06/2014  . Depression (emotion) 10/06/2014  . Dementia arising in the senium and presenium 10/06/2014  . Hypersomnia with sleep apnea 10/06/2014  . Neuropathy 07/08/2012  . Back pain 07/08/2012  . HYPERCHOLESTEROLEMIA 04/06/2010  . OVERWEIGHT 04/05/2010  . SKIN RASH 03/08/2010  . VERTIGO 05/06/2009  . CONJUNCTIVITIS, ALLERGIC 02/03/2009  . DOE (dyspnea on exertion) 11/03/2008  . SLEEP APNEA, OBSTRUCTIVE, MILD 07/28/2008  . VITAMIN D DEFICIENCY 06/25/2008  . FATIGUE 06/23/2008  . EDEMA 06/23/2008  . ALLERGIC RHINITIS 05/10/2008  . SHOULDER PAIN, RIGHT 04/22/2008  . NECK PAIN 10/27/2007  . REACTIVE AIRWAY DISEASE 10/01/2007  . PELVIC  PAIN 10/01/2007  . 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  POSTMENOPAUSAL STATUS 08/18/2007  . SCOLIOSIS, LUMBAR SPINE 08/18/2007  . VARICOSE VEINS, LOWER EXTREMITIES 07/28/2007  . GERD 07/28/2007  . 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  LUMBAGO 07/28/2007  . POSITIVE PPD 05/19/2007    PAYSEUR,ANGIE PTA 05/03/2017, 9:17 AM  [place], [place], [address] Phone: [phone]   Fax:  [phone]  Name: Kayla Davies MRN: [id number] Date of Birth: 1955/04/07

## 2017-05-07 ENCOUNTER — Ambulatory Visit: Payer: Medicaid Other | Admitting: Physical Therapy

## 2017-05-07 ENCOUNTER — Encounter: Payer: Self-pay | Admitting: Physical Therapy

## 2017-05-07 DIAGNOSIS — R293 Abnormal posture: Secondary | ICD-10-CM | POA: Diagnosis not present

## 2017-05-07 DIAGNOSIS — M25611 Stiffness of right shoulder, not elsewhere classified: Secondary | ICD-10-CM

## 2017-05-07 DIAGNOSIS — M62830 Muscle spasm of back: Secondary | ICD-10-CM

## 2017-05-07 DIAGNOSIS — M25511 Pain in right shoulder: Secondary | ICD-10-CM

## 2017-05-07 DIAGNOSIS — G8929 Other chronic pain: Secondary | ICD-10-CM

## 2017-05-07 NOTE — Therapy (Signed)
[address] [place], [place], [address] Phone: [phone]   Fax:  [phone]  Physical Therapy Treatment  Patient Details  Name: Kayla Davies MRN: [id number] Date of Birth: 01-24-1956 Referring Provider: Kevan Ny   Encounter Date: 05/07/2017  PT End of Session - 05/07/17 1141    Visit Number  16    Date for PT Re-Evaluation  06/05/17    PT Start Time  1109    PT Stop Time  1155    PT Time Calculation (min)  46 min    Activity Tolerance  Patient tolerated treatment well    Behavior During Therapy  [facility] for tasks assessed/performed       Past Medical History:  Diagnosis Date  . Allergic rhinitis, cause unspecified   . Alzheimer's dementia   . Arthropathy, unspecified, other specified sites   . Asymptomatic varicose veins   . Bladder incontinence   . Bowel incontinence   . Bronchitis   . Cervicalgia   . Edema   . Esophageal reflux   . Lumbago   . Obstructive sleep apnea (adult) (pediatric)   . Other malaise and fatigue   . Pain in joint, shoulder region   . Radiculopathy of lumbar region   . Reactive airway disease   . Scoliosis (and kyphoscoliosis), idiopathic   . 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 Unspecified asthma(493.90)   . Unspecified disorder of lipoid metabolism   . Unspecified vitamin D deficiency   . Vertigo     Past Surgical History:  Procedure Laterality Date  . NECK SURGERY      There were no vitals filed for this visit.  Subjective Assessment - 05/07/17 1112    Subjective  "Im good" Pt does report pain in R arm and low back (9 min late)    Currently in Pain?  Yes    Pain Score  4     Pain Location  -- back and shoulder                        [facility] Adult PT Treatment/Exercise - 05/07/17 0001      Neck Exercises: Machines for Strengthening   UBE (Upper Arm Bike)  L4 57fd/3 back    Other Machines for Strengthening  Nustep level 5 x 6 minutes      Lumbar  Exercises: Machines for Strengthening   Cybex Knee Extension  10lb 2x10    Cybex Knee Flexion  25lb 2x10    Leg Press  30lb 3x10    Other Lumbar Machine Exercise  Rows & lats 2x10 20lb      Moist Heat Therapy   Number Minutes Moist Heat  15 Minutes    Moist Heat Location  Lumbar Spine;Cervical               PT Short Term Goals - 01/25/17 1041      PT SHORT TERM GOAL #1   Title  pt to be I with inital HEP     Time  3    Period  Weeks    Status  Achieved      PT SHORT TERM GOAL #2   Title  verbalize and demo proper posture to prevent and reduce low back, neck and shoulder pain     Baseline  continued cues for proper form    Time  3    Period  Weeks    Status  Partially Met        PT Long Term Goals - 05/03/17 0856      PT LONG TERM GOAL #1   Title  pt to increase R shoulder AROM to 100 degrees for better ADL's    Status  Achieved      PT LONG TERM GOAL #2   Title  increase R shoulder strength to 4-/5 without pain > 4/10    Baseline  4/5 BIL UE grossly tested in standing    Status  Achieved      PT LONG TERM GOAL #3   Title  increase lumbar ROM 25%    Status  Achieved      PT LONG TERM GOAL #4   Title  may return to walking for exercise or her gym at her apartment    Baseline  pt verb walking some on nice days, has not returned gym but ex at home    Status  Partially Met            Plan - 05/07/17 1142    Clinical Impression Statement  Pt ~ 9 to 10 minutes late for today's  session. Continues to do all exercises well but reports pain in R shoulder ans low back. No interpreter so feedback was limited. Postural cues throughout.     Rehab Potential  Fair    PT Frequency  2x / week    PT Duration  8 weeks    PT Treatment/Interventions  ADLs/Self Care Home Management;Electrical Stimulation;Cryotherapy;Iontophoresis 58m/ml Dexamethasone;Moist Heat;Ultrasound;Therapeutic exercise;Therapeutic activities;Dry needling;Taping;Manual techniques;Neuromuscular  re-education;Passive range of motion;Patient/family education    PT Next Visit Plan  start preparing for D/C and transition back to gym apartment ex       Patient will benefit from skilled therapeutic intervention in order to improve the following deficits and impairments:  Pain, Postural dysfunction, Improper body mechanics, Decreased mobility, Decreased strength, Decreased range of motion, Decreased endurance, Increased fascial restricitons, Increased muscle spasms, Decreased activity tolerance, Impaired UE functional use  Visit Diagnosis: Abnormal posture  Stiffness of right shoulder, not elsewhere classified  Chronic right shoulder pain  Muscle spasm of back     Problem List Patient Active Problem List   Diagnosis Date Noted  . MCI (mild cognitive impairment) 10/23/2016  . Chronic cough 10/19/2016  . Moderate single current episode of major depressive disorder ([place] 12/20/2015  . Other seasonal allergic rhinitis 03/31/2015  . 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 Muscle tension headache 10/06/2014  . Depression (emotion) 10/06/2014  . Dementia arising in the senium and presenium 10/06/2014  . Hypersomnia with sleep apnea 10/06/2014  . Neuropathy 07/08/2012  . Back pain 07/08/2012  . HYPERCHOLESTEROLEMIA 04/06/2010  . OVERWEIGHT 04/05/2010  . SKIN RASH 03/08/2010  . VERTIGO 05/06/2009  . CONJUNCTIVITIS, ALLERGIC 02/03/2009  . DOE (dyspnea on exertion) 11/03/2008  . SLEEP APNEA, OBSTRUCTIVE, MILD 07/28/2008  . VITAMIN D DEFICIENCY 06/25/2008  . FATIGUE 06/23/2008  . EDEMA 06/23/2008  . ALLERGIC RHINITIS 05/10/2008  . SHOULDER PAIN, RIGHT 04/22/2008  . NECK PAIN 10/27/2007  . REACTIVE AIRWAY DISEASE 10/01/2007  . PELVIC  PAIN 10/01/2007  . 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 POSTMENOPAUSAL STATUS 08/18/2007  . SCOLIOSIS, LUMBAR SPINE 08/18/2007  . VARICOSE VEINS, LOWER EXTREMITIES 07/28/2007  . GERD 07/28/2007  . CYSTOCELE WITHOUT MENTION UTERINE  PROLAPSE MIDLN 016/10/9602 . LUMBAGO 07/28/2007  . POSITIVE PPD 05/19/2007    RScot Jun PTA 05/07/2017, 11:43 AM  [place]  [place], [place], [address] Phone: [phone]   Fax:  [phone]  Name: Kayla Davies MRN: [id number] Date of Birth: 1955/03/24

## 2017-05-09 ENCOUNTER — Ambulatory Visit: Payer: Medicaid Other | Admitting: Physical Therapy

## 2017-05-09 ENCOUNTER — Other Ambulatory Visit (HOSPITAL_COMMUNITY): Payer: Self-pay | Admitting: Gastroenterology

## 2017-05-09 DIAGNOSIS — R293 Abnormal posture: Secondary | ICD-10-CM

## 2017-05-09 DIAGNOSIS — R059 Cough, unspecified: Secondary | ICD-10-CM

## 2017-05-09 DIAGNOSIS — M542 Cervicalgia: Secondary | ICD-10-CM

## 2017-05-09 DIAGNOSIS — G8929 Other chronic pain: Secondary | ICD-10-CM

## 2017-05-09 DIAGNOSIS — M545 Low back pain: Secondary | ICD-10-CM

## 2017-05-09 DIAGNOSIS — R05 Cough: Secondary | ICD-10-CM

## 2017-05-09 DIAGNOSIS — M25511 Pain in right shoulder: Secondary | ICD-10-CM

## 2017-05-09 DIAGNOSIS — M62830 Muscle spasm of back: Secondary | ICD-10-CM

## 2017-05-09 DIAGNOSIS — M25611 Stiffness of right shoulder, not elsewhere classified: Secondary | ICD-10-CM

## 2017-05-09 NOTE — Therapy (Signed)
[place], [place], [address] Phone: [phone]   Fax:  [phone]  Physical Therapy Treatment  Patient Details  Name: Kayla Davies MRN: [id number] Date of Birth: March 08, 1955 Referring Provider: Kevan Ny   Encounter Date: 05/09/2017  PT End of Session - 05/09/17 1424    Visit Number  17    Date for PT Re-Evaluation  06/05/17    PT Start Time  1400    PT Stop Time  1450    PT Time Calculation (min)  50 min       Past Medical History:  Diagnosis Date  . Allergic rhinitis, cause unspecified   . Alzheimer's dementia   . Arthropathy, unspecified, other specified sites   . Asymptomatic varicose veins   . Bladder incontinence   . Bowel incontinence   . Bronchitis   . Cervicalgia   . Edema   . Esophageal reflux   . Lumbago   . Obstructive sleep apnea (adult) (pediatric)   . Other malaise and fatigue   . Pain in joint, shoulder region   . Radiculopathy of lumbar region   . Reactive airway disease   . Scoliosis (and kyphoscoliosis), idiopathic   . 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 Unspecified asthma(493.90)   . Unspecified disorder of lipoid metabolism   . Unspecified vitamin D deficiency   . Vertigo     Past Surgical History:  Procedure Laterality Date  . NECK SURGERY      There were no vitals filed for this visit.  Subjective Assessment - 05/09/17 1403    Subjective  doing okay    Patient is accompained by:  Interpreter    Currently in Pain?  Yes    Pain Score  5                        [facility] Adult PT Treatment/Exercise - 05/09/17 0001      Neck Exercises: Machines for Strengthening   UBE (Upper Arm Bike)  L4 2fd/3 back      Lumbar Exercises: Machines for Strengthening   Other Lumbar Machine Exercise  lats 20# 2 sets 15    Other Lumbar Machine Exercise  pulleys scap stab 3 way 12 reps each 5# ( verb and tactile cuing needed)      Lumbar Exercises: Standing    Other Standing Lumbar Exercises  wall angles 1# 10 times postural cuing needed      Shoulder Exercises: ROM/Strengthening   Other ROM/Strengthening Exercises  head on ball in retraction with 2# UE ex    Other ROM/Strengthening Exercises  wall push ups with ball 15 times      Moist Heat Therapy   Number Minutes Moist Heat  15 Minutes    Moist Heat Location  Lumbar Spine;Cervical               PT Short Term Goals - 01/25/17 1041      PT SHORT TERM GOAL #1   Title  pt to be I with inital HEP     Time  3    Period  Weeks    Status  Achieved      PT SHORT TERM GOAL #2   Title  verbalize and demo proper posture to prevent and reduce low back, neck and shoulder pain     Baseline  continued cues for proper form    Time  3    Period  Weeks    Status  Partially Met        PT Long Term Goals - 05/03/17 0856      PT LONG TERM GOAL #1   Title  pt to increase R shoulder AROM to 100 degrees for better ADL's    Status  Achieved      PT LONG TERM GOAL #2   Title  increase R shoulder strength to 4-/5 without pain > 4/10    Baseline  4/5 BIL UE grossly tested in standing    Status  Achieved      PT LONG TERM GOAL #3   Title  increase lumbar ROM 25%    Status  Achieved      PT LONG TERM GOAL #4   Title  may return to walking for exercise or her gym at her apartment    Baseline  pt verb walking some on nice days, has not returned gym but ex at home    Status  Partially Met            Plan - 05/09/17 1425    Clinical Impression Statement  increased postural ex today to increase cerv strength an dshld strength/ROM- verb and tactile cuing needed    PT Treatment/Interventions  ADLs/Self Care Home Management;Electrical Stimulation;Cryotherapy;Iontophoresis 59m/ml Dexamethasone;Moist Heat;Ultrasound;Therapeutic exercise;Therapeutic activities;Dry needling;Taping;Manual techniques;Neuromuscular re-education;Passive range of motion;Patient/family education    PT Next Visit  Plan  start preparing for D/C and transition back to gym apartment ex       Patient will benefit from skilled therapeutic intervention in order to improve the following deficits and impairments:  Pain, Postural dysfunction, Improper body mechanics, Decreased mobility, Decreased strength, Decreased range of motion, Decreased endurance, Increased fascial restricitons, Increased muscle spasms, Decreased activity tolerance, Impaired UE functional use  Visit Diagnosis: Abnormal posture  Stiffness of right shoulder, not elsewhere classified  Chronic right shoulder pain  Muscle spasm of back  Cervicalgia  Bilateral low back pain, unspecified chronicity, with sciatica presence unspecified     Problem List Patient Active Problem List   Diagnosis Date Noted  . MCI (mild cognitive impairment) 10/23/2016  . Chronic cough 10/19/2016  . Moderate single current episode of major depressive disorder ([place] 12/20/2015  . Other seasonal allergic rhinitis 03/31/2015  . 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 Muscle tension headache 10/06/2014  . Depression (emotion) 10/06/2014  . Dementia arising in the senium and presenium 10/06/2014  . Hypersomnia with sleep apnea 10/06/2014  . Neuropathy 07/08/2012  . Back pain 07/08/2012  . HYPERCHOLESTEROLEMIA 04/06/2010  . OVERWEIGHT 04/05/2010  . SKIN RASH 03/08/2010  . VERTIGO 05/06/2009  . CONJUNCTIVITIS, ALLERGIC 02/03/2009  . DOE (dyspnea on exertion) 11/03/2008  . SLEEP APNEA, OBSTRUCTIVE, MILD 07/28/2008  . VITAMIN D DEFICIENCY 06/25/2008  . FATIGUE 06/23/2008  . EDEMA 06/23/2008  . ALLERGIC RHINITIS 05/10/2008  . SHOULDER PAIN, RIGHT 04/22/2008  . NECK PAIN 10/27/2007  . REACTIVE AIRWAY DISEASE 10/01/2007  . PELVIC  PAIN 10/01/2007  . 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 POSTMENOPAUSAL STATUS 08/18/2007  . SCOLIOSIS, LUMBAR SPINE 08/18/2007  . VARICOSE VEINS, LOWER EXTREMITIES 07/28/2007  . GERD 07/28/2007  . 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 LUMBAGO 07/28/2007  . POSITIVE PPD 05/19/2007    Crimson Dubberly,ANGIE PTA 05/09/2017, 2:26 PM  [place] [address]Phone: [phone]  Fax:  [phone] Name: Kayla WHETSELMRN: [id number] of Birth:  1955-07-19

## 2017-05-10 ENCOUNTER — Other Ambulatory Visit (HOSPITAL_COMMUNITY): Payer: Self-pay | Admitting: Gastroenterology

## 2017-05-10 DIAGNOSIS — R1310 Dysphagia, unspecified: Secondary | ICD-10-CM

## 2017-05-14 ENCOUNTER — Ambulatory Visit: Payer: Medicaid Other | Admitting: Physical Therapy

## 2017-05-14 DIAGNOSIS — G8929 Other chronic pain: Secondary | ICD-10-CM

## 2017-05-14 DIAGNOSIS — M25511 Pain in right shoulder: Secondary | ICD-10-CM

## 2017-05-14 DIAGNOSIS — M25611 Stiffness of right shoulder, not elsewhere classified: Secondary | ICD-10-CM

## 2017-05-14 DIAGNOSIS — M62830 Muscle spasm of back: Secondary | ICD-10-CM

## 2017-05-14 DIAGNOSIS — R293 Abnormal posture: Secondary | ICD-10-CM

## 2017-05-14 NOTE — Therapy (Signed)
[place], [place], [address] Phone: [phone]   Fax:  [phone]  Physical Therapy Treatment  Patient Details  Name: Kayla Davies MRN: [id number] Date of Birth: [birth year] Referring Provider: Kevan Ny   Encounter Date: 05/14/2017  PT End of Session - 05/14/17 1150    Visit Number  18    Date for PT Re-Evaluation  06/05/17    PT Start Time  1108    PT Stop Time  1200    PT Time Calculation (min)  52 min    Activity Tolerance  Patient tolerated treatment well    Behavior During Therapy  [facility] for tasks assessed/performed;Impulsive       Past Medical History:  Diagnosis Date  . Allergic rhinitis, cause unspecified   . Alzheimer's dementia   . Arthropathy, unspecified, other specified sites   . Asymptomatic varicose veins   . Bladder incontinence   . Bowel incontinence   . Bronchitis   . Cervicalgia   . Edema   . Esophageal reflux   . Lumbago   . Obstructive sleep apnea (adult) (pediatric)   . Other malaise and fatigue   . Pain in joint, shoulder region   . Radiculopathy of lumbar region   . Reactive airway disease   . Scoliosis (and kyphoscoliosis), idiopathic   . 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 Unspecified asthma(493.90)   . Unspecified disorder of lipoid metabolism   . Unspecified vitamin D deficiency   . Vertigo     Past Surgical History:  Procedure Laterality Date  . NECK SURGERY      There were no vitals filed for this visit.  Subjective Assessment - 05/14/17 1107    Subjective  "doing ok" pt reports having pain in right shoulder and back.    Patient is accompained by:  Interpreter    Currently in Pain?  Yes    Pain Score  5     Pain Location  Shoulder    Pain Orientation  Right                       [facility] Adult PT Treatment/Exercise - 05/14/17 0001      Neck Exercises: Standing   Other Standing Exercises  Wall Angels 10x, cervical ball on the  wall with 10 shoulder shrugs, rolls, punches cuing to focus on posture      Lumbar Exercises: Aerobic   UBE (Upper Arm Bike)  L3.4 80mn forward 3 min backwards      Lumbar Exercises: Machines for Strengthening   Other Lumbar Machine Exercise  lats 20# 2 sets 10, Rows 2x10    Other Lumbar Machine Exercise  pulleys scap stab 3 way 2x 10reps each d/c horizontal abduction due to pt c/o shoulder pain      Moist Heat Therapy   Number Minutes Moist Heat  15 Minutes    Moist Heat Location  Lumbar Spine;Shoulder               PT Short Term Goals - 01/25/17 1041      PT SHORT TERM GOAL #1   Title  pt to be I with inital HEP     Time  3    Period  Weeks    Status  Achieved      PT SHORT TERM GOAL #2   Title  verbalize and demo proper posture to prevent and reduce low back, neck and shoulder  pain     Baseline  continued cues for proper form    Time  3    Period  Weeks    Status  Partially Met        PT Long Term Goals - 05/03/17 0856      PT LONG TERM GOAL #1   Title  pt to increase R shoulder AROM to 100 degrees for better ADL's    Status  Achieved      PT LONG TERM GOAL #2   Title  increase R shoulder strength to 4-/5 without pain > 4/10    Baseline  4/5 BIL UE grossly tested in standing    Status  Achieved      PT LONG TERM GOAL #3   Title  increase lumbar ROM 25%    Status  Achieved      PT LONG TERM GOAL #4   Title  may return to walking for exercise or her gym at her apartment    Baseline  pt verb walking some on nice days, has not returned gym but ex at home    Status  Partially Met            Plan - 05/14/17 1151    Clinical Impression Statement  Pt arrived 7 minutes late. Pt needed tactile and verbal cuing to for correct posture,and technique during therapeutic exercises. During horizontal abduction, pt c/o R shoulder pain.    PT Frequency  2x / week    PT Duration  8 weeks    PT Treatment/Interventions  ADLs/Self Care Home Management;Electrical  Stimulation;Cryotherapy;Iontophoresis 48m/ml Dexamethasone;Moist Heat;Ultrasound;Therapeutic exercise;Therapeutic activities;Dry needling;Taping;Manual techniques;Neuromuscular re-education;Passive range of motion;Patient/family education    PT Next Visit Plan  Continue to strengthen shoulder and start preparing for D/C    PT Home Exercise Plan  chin tuck, upper trap stretch, table slides flexion/ abduction, scapular retraction ER/IR unattached yellow ,lower trunk rotation, hamstring stretch, supine marching, rib mobs, and updated chin tuck with towel        Patient will benefit from skilled therapeutic intervention in order to improve the following deficits and impairments:  Pain, Postural dysfunction, Improper body mechanics, Decreased mobility, Decreased strength, Decreased range of motion, Decreased endurance, Increased fascial restricitons, Increased muscle spasms, Decreased activity tolerance, Impaired UE functional use  Visit Diagnosis: Abnormal posture  Stiffness of right shoulder, not elsewhere classified  Chronic right shoulder pain  Muscle spasm of back     Problem List Patient Active Problem List   Diagnosis Date Noted  . MCI (mild cognitive impairment) 10/23/2016  . Chronic cough 10/19/2016  . Moderate single current episode of major depressive disorder ([place] 12/20/2015  . Other seasonal allergic rhinitis 03/31/2015  . 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 Muscle tension headache 10/06/2014  . Depression (emotion) 10/06/2014  . Dementia arising in the senium and presenium 10/06/2014  . Hypersomnia with sleep apnea 10/06/2014  . Neuropathy 07/08/2012  . Back pain 07/08/2012  . HYPERCHOLESTEROLEMIA 04/06/2010  . OVERWEIGHT 04/05/2010  . SKIN RASH 03/08/2010  . VERTIGO 05/06/2009  . CONJUNCTIVITIS, ALLERGIC 02/03/2009  . DOE (dyspnea on exertion) 11/03/2008  . SLEEP APNEA, OBSTRUCTIVE, MILD 07/28/2008  . VITAMIN D DEFICIENCY 06/25/2008  . FATIGUE 06/23/2008  .  EDEMA 06/23/2008  . ALLERGIC RHINITIS 05/10/2008  . SHOULDER PAIN, RIGHT 04/22/2008  . NECK PAIN 10/27/2007  . REACTIVE AIRWAY DISEASE 10/01/2007  . PELVIC  PAIN 10/01/2007  . 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 POSTMENOPAUSAL STATUS 08/18/2007  . SCOLIOSIS, LUMBAR  SPINE 08/18/2007  . VARICOSE VEINS, LOWER EXTREMITIES 07/28/2007  . GERD 07/28/2007  . 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 LUMBAGO 07/28/2007  . POSITIVE PPD 05/19/2007    Loyal Gambler 05/14/2017, 12:16 PM  [place], [place], [address] Phone: [phone]   Fax:  [phone]  Name: Kayla Davies MRN: [id number] Date of Birth: [birth year]

## 2017-05-15 ENCOUNTER — Ambulatory Visit (HOSPITAL_COMMUNITY)
Admission: RE | Admit: 2017-05-15 | Discharge: 2017-05-15 | Disposition: A | Discharge status: Home / Self Care | Payer: Medicaid Other | Source: Ambulatory Visit | Attending: Gastroenterology | Admitting: Gastroenterology

## 2017-05-15 DIAGNOSIS — R1310 Dysphagia, unspecified: Secondary | ICD-10-CM | POA: Diagnosis not present

## 2017-05-15 NOTE — Progress Notes (Signed)
Modified Barium Swallow Progress Note  Patient Details  Name: Merrilee SeashoreHansu W Gabriel MRN: [id number] Date of Birth: 12/02/1955  Today's Date: 05/15/2017  Modified Barium Swallow completed.  Full report located under Chart Review in the Imaging Section.  Brief recommendations include the following:  Clinical Impression   Pt presents with overtly functional oropharyngeal swallow ability despite Min flash penetration with thin liquid via straw. Pt had complete and independent manipulation and transfer of bolus, timely swallow initiation, and clearance of pharyngeal cavity. No aspiration was observed across challenging. Given pt's observed swallow function recommend regular solid textures as tolerated by pt and thin liquids, general aspiration precautions (small slow bites and sips and only eat when sitting upright), and medications administered whole with thin liquid. No further SLP intervention warranted at this time.       Swallow Evaluation Recommendations       SLP Diet Recommendations: Thin liquid;Regular solids   Liquid Administration via: Cup;Straw   Medication Administration: Whole meds with liquid   Supervision: Patient able to self feed;Intermittent supervision to cue for compensatory strategies   Compensations: Slow rate;Small sips/bites   Postural Changes: Seated upright at 90 degrees   Oral Care Recommendations: Oral care BID       [place] Shameer [place] SLP Student Clinician  [place] Athena Baltz 05/15/2017,11:51 AM

## 2017-05-16 ENCOUNTER — Ambulatory Visit: Payer: Medicaid Other | Admitting: Physical Therapy

## 2017-05-16 DIAGNOSIS — M25611 Stiffness of right shoulder, not elsewhere classified: Secondary | ICD-10-CM

## 2017-05-16 DIAGNOSIS — G8929 Other chronic pain: Secondary | ICD-10-CM

## 2017-05-16 DIAGNOSIS — R293 Abnormal posture: Secondary | ICD-10-CM

## 2017-05-16 DIAGNOSIS — M25511 Pain in right shoulder: Secondary | ICD-10-CM

## 2017-05-16 NOTE — Therapy (Signed)
[place], [place], [address] Phone: [phone]   Fax:  [phone]  Physical Therapy Treatment  Patient Details  Name: Kayla Davies MRN: [id number] Date of Birth: 11-Oct-1955 Referring Provider: Kevan Ny   Encounter Date: 05/16/2017  PT End of Session - 05/16/17 1136    Visit Number  19    Date for PT Re-Evaluation  06/05/17    PT Start Time  1108    PT Stop Time  1143    PT Time Calculation (min)  35 min    Activity Tolerance  Patient tolerated treatment well    Behavior During Therapy  [name] for tasks assessed/performed;Impulsive       Past Medical History:  Diagnosis Date  . Allergic rhinitis, cause unspecified   . Alzheimer's dementia   . Arthropathy, unspecified, other specified sites   . Asymptomatic varicose veins   . Bladder incontinence   . Bowel incontinence   . Bronchitis   . Cervicalgia   . Edema   . Esophageal reflux   . Lumbago   . Obstructive sleep apnea (adult) (pediatric)   . Other malaise and fatigue   . Pain in joint, shoulder region   . Radiculopathy of lumbar region   . Reactive airway disease   . Scoliosis (and kyphoscoliosis), idiopathic   . 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 Unspecified asthma(493.90)   . Unspecified disorder of lipoid metabolism   . Unspecified vitamin D deficiency   . Vertigo     Past Surgical History:  Procedure Laterality Date  . NECK SURGERY      There were no vitals filed for this visit.  Subjective Assessment - 05/16/17 1107    Subjective  8 min late again. no interpreter         [facility] PT Assessment - 05/16/17 0001      AROM   Overall AROM Comments  cerv ROM WNLS, BIL shld ROM =                   [facility] Adult PT Treatment/Exercise - 05/16/17 0001      Neck Exercises: Machines for Strengthening   UBE (Upper Arm Bike)  L3.2 9fd/3back (Pt standing)      Neck Exercises: Standing   Neck Retraction  10 reps ball  on wall;bicep curls, flexion, punches, presses      Lumbar Exercises: Machines for Strengthening   Other Lumbar Machine Exercise  Lats 20# 1x 15, Rows 15lb 2x15      Moist Heat Therapy   Number Minutes Moist Heat  10 Minutes    Moist Heat Location  Cervical;Lumbar Spine               PT Short Term Goals - 01/25/17 1041      PT SHORT TERM GOAL #1   Title  pt to be I with inital HEP     Time  3    Period  Weeks    Status  Achieved      PT SHORT TERM GOAL #2   Title  verbalize and demo proper posture to prevent and reduce low back, neck and shoulder pain     Baseline  continued cues for proper form    Time  3    Period  Weeks    Status  Partially Met        PT Long Term Goals - 05/16/17 1108      PT  LONG TERM GOAL #4   Title  may return to walking for exercise or her gym at her apartment    Status  Partially Met            Plan - 05/16/17 1137    Clinical Impression Statement  Pt arrived 8 minutes late. Pt needed vc and tactile cuing to depress scapula during rows. Pt cervical and shoulder ROM is WFL, however she still c/o of cervical and lumbar pain. After PT,pt c/o of right anterior rib pain.    PT Frequency  2x / week    PT Duration  8 weeks    PT Treatment/Interventions  ADLs/Self Care Home Management;Electrical Stimulation;Cryotherapy;Iontophoresis 85m/ml Dexamethasone;Moist Heat;Ultrasound;Therapeutic exercise;Therapeutic activities;Dry needling;Taping;Manual techniques;Neuromuscular re-education;Passive range of motion;Patient/family education    PT Next Visit Plan  continue to work on posture during therapeutic exercise    PT Home Exercise Plan  chin tuck, upper trap stretch, table slides flexion/ abduction, scapular retraction ER/IR unattached yellow ,lower trunk rotation, hamstring stretch, supine marching, rib mobs, and updated chin tuck with towel        Patient will benefit from skilled therapeutic intervention in order to improve the following  deficits and impairments:  Pain, Postural dysfunction, Improper body mechanics, Decreased mobility, Decreased strength, Decreased range of motion, Decreased endurance, Increased fascial restricitons, Increased muscle spasms, Decreased activity tolerance, Impaired UE functional use  Visit Diagnosis: Abnormal posture  Stiffness of right shoulder, not elsewhere classified  Chronic right shoulder pain     Problem List Patient Active Problem List   Diagnosis Date Noted  . MCI (mild cognitive impairment) 10/23/2016  . Chronic cough 10/19/2016  . Moderate single current episode of major depressive disorder ([place] 12/20/2015  . Other seasonal allergic rhinitis 03/31/2015  . 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 Muscle tension headache 10/06/2014  . Depression (emotion) 10/06/2014  . Dementia arising in the senium and presenium 10/06/2014  . Hypersomnia with sleep apnea 10/06/2014  . Neuropathy 07/08/2012  . Back pain 07/08/2012  . HYPERCHOLESTEROLEMIA 04/06/2010  . OVERWEIGHT 04/05/2010  . SKIN RASH 03/08/2010  . VERTIGO 05/06/2009  . CONJUNCTIVITIS, ALLERGIC 02/03/2009  . DOE (dyspnea on exertion) 11/03/2008  . SLEEP APNEA, OBSTRUCTIVE, MILD 07/28/2008  . VITAMIN D DEFICIENCY 06/25/2008  . FATIGUE 06/23/2008  . EDEMA 06/23/2008  . ALLERGIC RHINITIS 05/10/2008  . SHOULDER PAIN, RIGHT 04/22/2008  . NECK PAIN 10/27/2007  . REACTIVE AIRWAY DISEASE 10/01/2007  . PELVIC  PAIN 10/01/2007  . 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 POSTMENOPAUSAL STATUS 08/18/2007  . SCOLIOSIS, LUMBAR SPINE 08/18/2007  . VARICOSE VEINS, LOWER EXTREMITIES 07/28/2007  . GERD 07/28/2007  . 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 LUMBAGO 07/28/2007  . POSITIVE PPD 05/19/2007    KLoyal Gambler4/18/2019, 11:47 AM  [address] [address]Phone: [phone]  Fax:   [phone] Name: Kayla ISLEYMRN: [id number] of Birth: [birth year]

## 2017-05-21 ENCOUNTER — Ambulatory Visit: Payer: Medicaid Other | Admitting: Physical Therapy

## 2017-05-21 ENCOUNTER — Encounter: Payer: Self-pay | Admitting: Physical Therapy

## 2017-05-21 ENCOUNTER — Encounter: Payer: Self-pay | Admitting: Adult Health

## 2017-05-21 DIAGNOSIS — M25611 Stiffness of right shoulder, not elsewhere classified: Secondary | ICD-10-CM

## 2017-05-21 DIAGNOSIS — R293 Abnormal posture: Secondary | ICD-10-CM | POA: Diagnosis not present

## 2017-05-21 DIAGNOSIS — M25511 Pain in right shoulder: Secondary | ICD-10-CM

## 2017-05-21 DIAGNOSIS — G8929 Other chronic pain: Secondary | ICD-10-CM

## 2017-05-21 NOTE — Therapy (Signed)
[place], [place], [address] Phone: [phone]   Fax:  [phone]  Physical Therapy Treatment This note covers period from visit 10-20. Patient Details  Name: Kayla Davies MRN: [id number] Date of Birth: 18-Jul-1955 Referring Provider: Kevan Ny   Encounter Date: 05/21/2017  PT End of Session - 05/21/17 1141    Visit Number  20    Date for PT Re-Evaluation  06/05/17    PT Start Time  1106    PT Stop Time  1150    PT Time Calculation (min)  44 min    Activity Tolerance  Patient tolerated treatment well    Behavior During Therapy  [facility] for tasks assessed/performed;Impulsive       Past Medical History:  Diagnosis Date  . Allergic rhinitis, cause unspecified   . Alzheimer's dementia   . Arthropathy, unspecified, other specified sites   . Asymptomatic varicose veins   . Bladder incontinence   . Bowel incontinence   . Bronchitis   . Cervicalgia   . Edema   . Esophageal reflux   . Lumbago   . Obstructive sleep apnea (adult) (pediatric)   . Other malaise and fatigue   . Pain in joint, shoulder region   . Radiculopathy of lumbar region   . Reactive airway disease   . Scoliosis (and kyphoscoliosis), idiopathic   . 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 Unspecified asthma(493.90)   . Unspecified disorder of lipoid metabolism   . Unspecified vitamin D deficiency   . Vertigo     Past Surgical History:  Procedure Laterality Date  . NECK SURGERY      There were no vitals filed for this visit.  Subjective Assessment - 05/21/17 1110    Subjective  "I'm doing good". No interpreter    Currently in Pain?  Yes    Pain Score  5     Pain Location  Back    Pain Orientation  Right    Pain Onset  More than a month ago                       [facility] Adult PT Treatment/Exercise - 05/21/17 0001      Neck Exercises: Standing   Neck Retraction  20 reps with green ball      Lumbar Exercises:  Aerobic   UBE (Upper Arm Bike)  L4 30mn forward/3 min back      Lumbar Exercises: Machines for Strengthening   Other Lumbar Machine Exercise  Lats 20# 1x 15, Rows 15lb 2x15 pt c/o right shoulder pain during lat pull    Other Lumbar Machine Exercise  pulleys scap stab 3 way 2x 10reps each      Moist Heat Therapy   Number Minutes Moist Heat  10 Minutes    Moist Heat Location  Cervical;Lumbar Spine               PT Short Term Goals - 01/25/17 1041      PT SHORT TERM GOAL #1   Title  pt to be I with inital HEP     Time  3    Period  Weeks    Status  Achieved      PT SHORT TERM GOAL #2   Title  verbalize and demo proper posture to prevent and reduce low back, neck and shoulder pain     Baseline  continued cues for proper form  Time  3    Period  Weeks    Status  Partially Met        PT Long Term Goals - 05/16/17 1108      PT LONG TERM GOAL #4   Title  may return to walking for exercise or her gym at her apartment    Status  Partially Met            Plan - 05/21/17 1148    Clinical Impression Statement  Pt did not have an interpreter today but reported that she was doing well. She verbally reported to having shoulder and back pain. She needed vc and tactile cuing to depress scapula, and correct head posture during 3 way pulley and seated rows.     Rehab Potential  Fair    PT Frequency  2x / week    PT Duration  8 weeks    PT Treatment/Interventions  ADLs/Self Care Home Management;Electrical Stimulation;Cryotherapy;Iontophoresis 50m/ml Dexamethasone;Moist Heat;Ultrasound;Therapeutic exercise;Therapeutic activities;Dry needling;Taping;Manual techniques;Neuromuscular re-education;Passive range of motion;Patient/family education    PT Next Visit Plan  continue to work on posture during therapeutic exercise    PT Home Exercise Plan  chin tuck, upper trap stretch, table slides flexion/ abduction, scapular retraction ER/IR unattached yellow ,lower trunk rotation,  hamstring stretch, supine marching, rib mobs, and updated chin tuck with towel     Consulted and Agree with Plan of Care  Patient       Patient will benefit from skilled therapeutic intervention in order to improve the following deficits and impairments:  Pain, Postural dysfunction, Improper body mechanics, Decreased mobility, Decreased strength, Decreased range of motion, Decreased endurance, Increased fascial restricitons, Increased muscle spasms, Decreased activity tolerance, Impaired UE functional use  Visit Diagnosis: Abnormal posture  Stiffness of right shoulder, not elsewhere classified  Chronic right shoulder pain     Problem List Patient Active Problem List   Diagnosis Date Noted  . MCI (mild cognitive impairment) 10/23/2016  . Chronic cough 10/19/2016  . Moderate single current episode of major depressive disorder ([place] 12/20/2015  . Other seasonal allergic rhinitis 03/31/2015  . 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 Muscle tension headache 10/06/2014  . Depression (emotion) 10/06/2014  . Dementia arising in the senium and presenium 10/06/2014  . Hypersomnia with sleep apnea 10/06/2014  . Neuropathy 07/08/2012  . Back pain 07/08/2012  . HYPERCHOLESTEROLEMIA 04/06/2010  . OVERWEIGHT 04/05/2010  . SKIN RASH 03/08/2010  . VERTIGO 05/06/2009  . CONJUNCTIVITIS, ALLERGIC 02/03/2009  . DOE (dyspnea on exertion) 11/03/2008  . SLEEP APNEA, OBSTRUCTIVE, MILD 07/28/2008  . VITAMIN D DEFICIENCY 06/25/2008  . FATIGUE 06/23/2008  . EDEMA 06/23/2008  . ALLERGIC RHINITIS 05/10/2008  . SHOULDER PAIN, RIGHT 04/22/2008  . NECK PAIN 10/27/2007  . REACTIVE AIRWAY DISEASE 10/01/2007  . PELVIC  PAIN 10/01/2007  . 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 POSTMENOPAUSAL STATUS 08/18/2007  . SCOLIOSIS, LUMBAR SPINE 08/18/2007  . VARICOSE VEINS, LOWER EXTREMITIES 07/28/2007  . GERD 07/28/2007  . 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 LUMBAGO 07/28/2007   . POSITIVE PPD 05/19/2007    KLoyal Gambler4/23/2019, 11:57 AM  [place] [address]Phone: [phone]  Fax:  [phone] Name: HSRAH AKEMRN: [id number] of Birth: [birth year]

## 2017-05-23 ENCOUNTER — Ambulatory Visit: Payer: Medicaid Other | Admitting: Physical Therapy

## 2017-05-23 ENCOUNTER — Encounter: Payer: Self-pay | Admitting: Physical Therapy

## 2017-05-23 DIAGNOSIS — M25611 Stiffness of right shoulder, not elsewhere classified: Secondary | ICD-10-CM

## 2017-05-23 DIAGNOSIS — R293 Abnormal posture: Secondary | ICD-10-CM | POA: Diagnosis not present

## 2017-05-23 DIAGNOSIS — G8929 Other chronic pain: Secondary | ICD-10-CM

## 2017-05-23 DIAGNOSIS — M25511 Pain in right shoulder: Secondary | ICD-10-CM

## 2017-05-23 NOTE — Therapy (Signed)
[place], [place], [address] Phone: [phone]   Fax:  [phone]  Physical Therapy Treatment  Patient Details  Name: Kayla Davies MRN: [id number] Date of Birth: September 14, 1955 Referring Provider: Kevan Ny   Encounter Date: 05/23/2017  PT End of Session - 05/23/17 1141    Visit Number  21    Date for PT Re-Evaluation  06/05/17    PT Start Time  1105    PT Stop Time  1150    PT Time Calculation (min)  45 min       Past Medical History:  Diagnosis Date  . Allergic rhinitis, cause unspecified   . Alzheimer's dementia   . Arthropathy, unspecified, other specified sites   . Asymptomatic varicose veins   . Bladder incontinence   . Bowel incontinence   . Bronchitis   . Cervicalgia   . Edema   . Esophageal reflux   . Lumbago   . Obstructive sleep apnea (adult) (pediatric)   . Other malaise and fatigue   . Pain in joint, shoulder region   . Radiculopathy of lumbar region   . Reactive airway disease   . Scoliosis (and kyphoscoliosis), idiopathic   . 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 Unspecified asthma(493.90)   . Unspecified disorder of lipoid metabolism   . Unspecified vitamin D deficiency   . Vertigo     Past Surgical History:  Procedure Laterality Date  . NECK SURGERY      There were no vitals filed for this visit.  Subjective Assessment - 05/23/17 1105    Subjective  "my shoulder and back in pain". no interpreter    Currently in Pain?  Yes    Pain Score  5     Pain Location  Back                       [facility] Adult PT Treatment/Exercise - 05/23/17 0001      Ambulation/Gait   Gait Comments  walked around building 2x no rest      Neck Exercises: Machines for Strengthening   UBE (Upper Arm Bike)  L3.2 31fd/3back (Pt standing to focus on posture)      Lumbar Exercises: Machines for Strengthening   Other Lumbar Machine Exercise  Lats 20# 2x 15, Rows 20lb 1x15 and  Rows 15lb 1x15    Other Lumbar Machine Exercise  5 wall angels (stopped with L c/o L shoulder pain),Black TB extension 1x10      Moist Heat Therapy   Number Minutes Moist Heat  10 Minutes    Moist Heat Location  Cervical;Lumbar Spine               PT Short Term Goals - 01/25/17 1041      PT SHORT TERM GOAL #1   Title  pt to be I with inital HEP     Time  3    Period  Weeks    Status  Achieved      PT SHORT TERM GOAL #2   Title  verbalize and demo proper posture to prevent and reduce low back, neck and shoulder pain     Baseline  continued cues for proper form    Time  3    Period  Weeks    Status  Partially Met        PT Long Term Goals - 05/16/17 1108      PT LONG  TERM GOAL #4   Title  may return to walking for exercise or her gym at her apartment    Status  Partially Met            Plan - 05/23/17 1142    Clinical Impression Statement  Pt did not have an interpreter today but reported that she was more tired today. However she walked around the building 2x with no rest breaks. She needed tactile cuing to retract scapula and vc for proper posture.     PT Frequency  2x / week    PT Duration  8 weeks    PT Treatment/Interventions  ADLs/Self Care Home Management;Electrical Stimulation;Cryotherapy;Iontophoresis 35m/ml Dexamethasone;Moist Heat;Ultrasound;Therapeutic exercise;Therapeutic activities;Dry needling;Taping;Manual techniques;Neuromuscular re-education;Passive range of motion;Patient/family education    PT Next Visit Plan  continue to work on posture during therapeutic exercise       Patient will benefit from skilled therapeutic intervention in order to improve the following deficits and impairments:  Pain, Postural dysfunction, Improper body mechanics, Decreased mobility, Decreased strength, Decreased range of motion, Decreased endurance, Increased fascial restricitons, Increased muscle spasms, Decreased activity tolerance, Impaired UE functional  use  Visit Diagnosis: Abnormal posture  Stiffness of right shoulder, not elsewhere classified  Chronic right shoulder pain     Problem List Patient Active Problem List   Diagnosis Date Noted  . MCI (mild cognitive impairment) 10/23/2016  . Chronic cough 10/19/2016  . Moderate single current episode of major depressive disorder ([place] 12/20/2015  . Other seasonal allergic rhinitis 03/31/2015  . 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 Muscle tension headache 10/06/2014  . Depression (emotion) 10/06/2014  . Dementia arising in the senium and presenium 10/06/2014  . Hypersomnia with sleep apnea 10/06/2014  . Neuropathy 07/08/2012  . Back pain 07/08/2012  . HYPERCHOLESTEROLEMIA 04/06/2010  . OVERWEIGHT 04/05/2010  . SKIN RASH 03/08/2010  . VERTIGO 05/06/2009  . CONJUNCTIVITIS, ALLERGIC 02/03/2009  . DOE (dyspnea on exertion) 11/03/2008  . SLEEP APNEA, OBSTRUCTIVE, MILD 07/28/2008  . VITAMIN D DEFICIENCY 06/25/2008  . FATIGUE 06/23/2008  . EDEMA 06/23/2008  . ALLERGIC RHINITIS 05/10/2008  . SHOULDER PAIN, RIGHT 04/22/2008  . NECK PAIN 10/27/2007  . REACTIVE AIRWAY DISEASE 10/01/2007  . PELVIC  PAIN 10/01/2007  . 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 POSTMENOPAUSAL STATUS 08/18/2007  . SCOLIOSIS, LUMBAR SPINE 08/18/2007  . VARICOSE VEINS, LOWER EXTREMITIES 07/28/2007  . GERD 07/28/2007  . 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 LUMBAGO 07/28/2007  . POSITIVE PPD 05/19/2007    KVito Berger4/25/2019, 11:54 AM  [place] [address]Phone: [phone]  Fax:  [phone] Name: Kayla HENSLERMRN: [id number] of Birth: [birth year]

## 2017-05-28 ENCOUNTER — Ambulatory Visit: Payer: Medicaid Other | Admitting: Physical Therapy

## 2017-05-28 ENCOUNTER — Encounter: Payer: Self-pay | Admitting: Physical Therapy

## 2017-05-28 DIAGNOSIS — M25611 Stiffness of right shoulder, not elsewhere classified: Secondary | ICD-10-CM

## 2017-05-28 DIAGNOSIS — M25511 Pain in right shoulder: Secondary | ICD-10-CM

## 2017-05-28 DIAGNOSIS — M62830 Muscle spasm of back: Secondary | ICD-10-CM

## 2017-05-28 DIAGNOSIS — R293 Abnormal posture: Secondary | ICD-10-CM | POA: Diagnosis not present

## 2017-05-28 DIAGNOSIS — G8929 Other chronic pain: Secondary | ICD-10-CM

## 2017-05-28 NOTE — Therapy (Signed)
Kayla Davies, [place], [address] Phone: [phone]   Fax:  [phone]  Physical Therapy Treatment  Patient Details  Name: Kayla Davies MRN: [id number] Date of Birth: [birth year] Referring Provider: Kevan Ny   Encounter Date: 05/28/2017  PT End of Session - 05/28/17 1143    Visit Number  22    Date for PT Re-Evaluation  06/05/17    PT Start Time  1105    PT Stop Time  1158    PT Time Calculation (min)  53 min    Activity Tolerance  Patient tolerated treatment well    Behavior During Therapy  [facility] for tasks assessed/performed       Past Medical History:  Diagnosis Date  . Allergic rhinitis, cause unspecified   . Alzheimer's dementia   . Arthropathy, unspecified, other specified sites   . Asymptomatic varicose veins   . Bladder incontinence   . Bowel incontinence   . Bronchitis   . Cervicalgia   . Edema   . Esophageal reflux   . Lumbago   . Obstructive sleep apnea (adult) (pediatric)   . Other malaise and fatigue   . Pain in joint, shoulder region   . Radiculopathy of lumbar region   . Reactive airway disease   . Scoliosis (and kyphoscoliosis), idiopathic   . 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 Unspecified asthma(493.90)   . Unspecified disorder of lipoid metabolism   . Unspecified vitamin D deficiency   . Vertigo     Past Surgical History:  Procedure Laterality Date  . NECK SURGERY      There were no vitals filed for this visit.  Subjective Assessment - 05/28/17 1110    Subjective  Pt reports that she is ok but her shoulder is still hurting    Currently in Pain?  Yes    Pain Score  5     Pain Location  -- shoulder and back                       [facility] Adult PT Treatment/Exercise - 05/28/17 0001      Ambulation/Gait   Gait Comments  walked around building 2x no rest      Neck Exercises: Machines for Strengthening   UBE (Upper Arm Bike)  L3.2 59fd/3back (Pt  standing to focus on posture)      Lumbar Exercises: Aerobic   Elliptical  NuStep L4 x6 min       Lumbar Exercises: Machines for Strengthening   Other Lumbar Machine Exercise  Lats 20# 2x10, Rows 20lb 2x10      Moist Heat Therapy   Number Minutes Moist Heat  15 Minutes    Moist Heat Location  Cervical;Lumbar Spine               PT Short Term Goals - 01/25/17 1041      PT SHORT TERM GOAL #1   Title  pt to be I with inital HEP     Time  3    Period  Weeks    Status  Achieved      PT SHORT TERM GOAL #2   Title  verbalize and demo proper posture to prevent and reduce low back, neck and shoulder pain     Baseline  continued cues for proper form    Time  3    Period  Weeks    Status  Partially Met  PT Long Term Goals - 05/16/17 1108      PT LONG TERM GOAL #4   Title  may return to walking for exercise or her gym at her apartment    Status  Partially Met            Plan - 05/28/17 1145    Clinical Impression Statement  No functional limitations at home she only reports pain I her shoulder and low back. Pt reports that the heat seems to help the most. No issues with today's activities.     Rehab Potential  Fair    PT Frequency  2x / week    PT Duration  8 weeks    PT Treatment/Interventions  ADLs/Self Care Home Management;Electrical Stimulation;Cryotherapy;Iontophoresis 62m/ml Dexamethasone;Moist Heat;Ultrasound;Therapeutic exercise;Therapeutic activities;Dry needling;Taping;Manual techniques;Neuromuscular re-education;Passive range of motion;Patient/family education    PT Next Visit Plan  d/C PT next week       Patient will benefit from skilled therapeutic intervention in order to improve the following deficits and impairments:  Pain, Postural dysfunction, Improper body mechanics, Decreased mobility, Decreased strength, Decreased range of motion, Increased fascial restricitons, Increased muscle spasms, Decreased activity tolerance, Impaired UE functional  use  Visit Diagnosis: Abnormal posture  Stiffness of right shoulder, not elsewhere classified  Muscle spasm of back  Chronic right shoulder pain     Problem List Patient Active Problem List   Diagnosis Date Noted  . MCI (mild cognitive impairment) 10/23/2016  . Chronic cough 10/19/2016  . Moderate single current episode of major depressive disorder ([place] 12/20/2015  . Other seasonal allergic rhinitis 03/31/2015  . 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 Muscle tension headache 10/06/2014  . Depression (emotion) 10/06/2014  . Dementia arising in the senium and presenium 10/06/2014  . Hypersomnia with sleep apnea 10/06/2014  . Neuropathy 07/08/2012  . Back pain 07/08/2012  . HYPERCHOLESTEROLEMIA 04/06/2010  . OVERWEIGHT 04/05/2010  . SKIN RASH 03/08/2010  . VERTIGO 05/06/2009  . CONJUNCTIVITIS, ALLERGIC 02/03/2009  . DOE (dyspnea on exertion) 11/03/2008  . SLEEP APNEA, OBSTRUCTIVE, MILD 07/28/2008  . VITAMIN D DEFICIENCY 06/25/2008  . FATIGUE 06/23/2008  . EDEMA 06/23/2008  . ALLERGIC RHINITIS 05/10/2008  . SHOULDER PAIN, RIGHT 04/22/2008  . NECK PAIN 10/27/2007  . REACTIVE AIRWAY DISEASE 10/01/2007  . PELVIC  PAIN 10/01/2007  . 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 POSTMENOPAUSAL STATUS 08/18/2007  . SCOLIOSIS, LUMBAR SPINE 08/18/2007  . VARICOSE VEINS, LOWER EXTREMITIES 07/28/2007  . GERD 07/28/2007  . 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 LUMBAGO 07/28/2007  . POSITIVE PPD 05/19/2007    RScot Jun PTA 05/28/2017, 11:47 AM  [address] [address]Phone: [phone]  Fax:  [phone] Name: Kayla PIRESMRN: [id number] of Birth: [birth year]

## 2017-05-30 ENCOUNTER — Encounter: Payer: Self-pay | Admitting: Physical Therapy

## 2017-05-30 ENCOUNTER — Ambulatory Visit: Payer: Medicaid Other | Attending: Internal Medicine | Admitting: Physical Therapy

## 2017-05-30 DIAGNOSIS — M25611 Stiffness of right shoulder, not elsewhere classified: Secondary | ICD-10-CM | POA: Diagnosis present

## 2017-05-30 DIAGNOSIS — M62830 Muscle spasm of back: Secondary | ICD-10-CM | POA: Insufficient documentation

## 2017-05-30 DIAGNOSIS — R293 Abnormal posture: Secondary | ICD-10-CM | POA: Insufficient documentation

## 2017-05-30 DIAGNOSIS — G8929 Other chronic pain: Secondary | ICD-10-CM | POA: Insufficient documentation

## 2017-05-30 DIAGNOSIS — M25511 Pain in right shoulder: Secondary | ICD-10-CM | POA: Diagnosis present

## 2017-05-30 NOTE — Therapy (Cosign Needed)
[facility] [address] [place], [place], [address] Phone: [phone]   Fax:  [phone]  Physical Therapy Treatment  Patient Details  Name: Kayla Davies MRN: [id number] Date of Birth: 05/11/1955 Referring Provider: Eric Dean   Encounter Date: 05/30/2017  PT End of Session - 05/30/17 1140    Visit Number  23    Date for PT Re-Evaluation  06/05/17    PT Start Time  1103    PT Stop Time  1152    PT Time Calculation (min)  49 min    Activity Tolerance  Patient tolerated treatment well    Behavior During Therapy  WFL for tasks assessed/performed       Past Medical History:  Diagnosis Date  . Allergic rhinitis, cause unspecified   . Alzheimer's dementia   . Arthropathy, unspecified, other specified sites   . Asymptomatic varicose veins   . Bladder incontinence   . Bowel incontinence   . Bronchitis   . Cervicalgia   . Edema   . Esophageal reflux   . Lumbago   . Obstructive sleep apnea (adult) (pediatric)   . Other malaise and fatigue   . Pain in joint, shoulder region   . Radiculopathy of lumbar region   . Reactive airway disease   . Scoliosis (and kyphoscoliosis), idiopathic   . 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 Unspecified asthma(493.90)   . Unspecified disorder of lipoid metabolism   . Unspecified vitamin D deficiency   . Vertigo     Past Surgical History:  Procedure Laterality Date  . NECK SURGERY      There were no vitals filed for this visit.  Subjective Assessment - 05/30/17 1109    Subjective  "I'm good today" No interpreter    Currently in Pain?  Yes    Pain Score  4     Pain Location  Back                       [facility] Adult PT Treatment/Exercise - 05/30/17 0001      Ambulation/Gait   Gait Comments  walked around building down 2 flights of stairs and  1.5 (pt c/o of LBP)      Neck Exercises: Machines for Strengthening   Cybex Row   20lb 2x10      Neck Exercises:  Standing   Neck Retraction  20 reps    Other Standing Exercises  ball on the wall bicep curls 10x with 3lb, 10x shoulder presses 3lb,  1lb 2x10 forward punches      Lumbar Exercises: Aerobic   UBE (Upper Arm Bike)  L3  3 min fwd/3 minback      Lumbar Exercises: Machines for Strengthening   Other Lumbar Machine Exercise  Lats 20lb 2x10      Moist Heat Therapy   Number Minutes Moist Heat  15 Minutes    Moist Heat Location  Cervical;Lumbar Spine               PT Short Term Goals - 01/25/17 1041      PT SHORT TERM GOAL #1   Title  pt to be I with inital HEP     Time  3    Period  Weeks    Status  Achieved      PT SHORT TERM GOAL #2   Title  verbalize and demo proper posture to prevent and reduce low back, neck and shoulder   pain     Baseline  continued cues for proper form    Time  3    Period  Weeks    Status  Partially Met        PT Long Term Goals - 05/16/17 1108      PT LONG TERM GOAL #4   Title  may return to walking for exercise or her gym at her apartment    Status  Partially Met            Plan - 05/30/17 1141    Clinical Impression Statement  Pt c/o of LBP during gait around the building. All other activities tolerated well.  Pt to be D/C next week.    Rehab Potential  Fair    PT Frequency  2x / week    PT Duration  8 weeks    PT Treatment/Interventions  ADLs/Self Care Home Management;Electrical Stimulation;Cryotherapy;Iontophoresis 8m/ml Dexamethasone;Moist Heat;Ultrasound;Therapeutic exercise;Therapeutic activities;Dry needling;Taping;Manual techniques;Neuromuscular re-education;Passive range of motion;Patient/family education    PT Next Visit Plan  d/C PT next week       Patient will benefit from skilled therapeutic intervention in order to improve the following deficits and impairments:  Pain, Postural dysfunction, Improper body mechanics, Decreased mobility, Decreased strength, Decreased range of motion, Increased fascial restricitons,  Increased muscle spasms, Decreased activity tolerance, Impaired UE functional use  Visit Diagnosis: Abnormal posture  Stiffness of right shoulder, not elsewhere classified  Muscle spasm of back  Chronic right shoulder pain     Problem List Patient Active Problem List   Diagnosis Date Noted  . MCI (mild cognitive impairment) 10/23/2016  . Chronic cough 10/19/2016  . Moderate single current episode of major depressive disorder ([place] 12/20/2015  . Other seasonal allergic rhinitis 03/31/2015  . 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 Muscle tension headache 10/06/2014  . Depression (emotion) 10/06/2014  . Dementia arising in the senium and presenium 10/06/2014  . Hypersomnia with sleep apnea 10/06/2014  . Neuropathy 07/08/2012  . Back pain 07/08/2012  . HYPERCHOLESTEROLEMIA 04/06/2010  . OVERWEIGHT 04/05/2010  . SKIN RASH 03/08/2010  . VERTIGO 05/06/2009  . CONJUNCTIVITIS, ALLERGIC 02/03/2009  . DOE (dyspnea on exertion) 11/03/2008  . SLEEP APNEA, OBSTRUCTIVE, MILD 07/28/2008  . VITAMIN D DEFICIENCY 06/25/2008  . FATIGUE 06/23/2008  . EDEMA 06/23/2008  . ALLERGIC RHINITIS 05/10/2008  . SHOULDER PAIN, RIGHT 04/22/2008  . NECK PAIN 10/27/2007  . REACTIVE AIRWAY DISEASE 10/01/2007  . PELVIC  PAIN 10/01/2007  . 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 POSTMENOPAUSAL STATUS 08/18/2007  . SCOLIOSIS, LUMBAR SPINE 08/18/2007  . VARICOSE VEINS, LOWER EXTREMITIES 07/28/2007  . GERD 07/28/2007  . 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 LUMBAGO 07/28/2007  . POSITIVE PPD 05/19/2007    KLoyal Gambler5/03/2017, 11:47 AM  [place] [address]Phone: [phone]  Fax:  [phone] Name: Kayla BRUNEYMRN: [id number] of Birth: [birth year]

## 2017-06-04 ENCOUNTER — Encounter: Payer: Self-pay | Admitting: Physical Therapy

## 2017-06-04 ENCOUNTER — Ambulatory Visit: Payer: Medicaid Other | Admitting: Physical Therapy

## 2017-06-04 DIAGNOSIS — R293 Abnormal posture: Secondary | ICD-10-CM | POA: Diagnosis not present

## 2017-06-04 DIAGNOSIS — M25611 Stiffness of right shoulder, not elsewhere classified: Secondary | ICD-10-CM

## 2017-06-04 DIAGNOSIS — M62830 Muscle spasm of back: Secondary | ICD-10-CM

## 2017-06-04 DIAGNOSIS — M25511 Pain in right shoulder: Secondary | ICD-10-CM

## 2017-06-04 DIAGNOSIS — G8929 Other chronic pain: Secondary | ICD-10-CM

## 2017-06-04 NOTE — Therapy (Signed)
[place], [place], [address] Phone: [phone]   Fax:  [phone]  Physical Therapy Treatment  Patient Details  Name: MAKENLI DERSTINE MRN: [id number] Date of Birth: February 27, 1955 Referring Provider: Kevan Ny   Encounter Date: 06/04/2017  PT End of Session - 06/04/17 1510    Visit Number  24    Date for PT Re-Evaluation  06/05/17    PT Start Time  1430    PT Stop Time  1524    PT Time Calculation (min)  54 min    Activity Tolerance  Patient tolerated treatment well    Behavior During Therapy  [facility] for tasks assessed/performed       Past Medical History:  Diagnosis Date  . Allergic rhinitis, cause unspecified   . Alzheimer's dementia   . Arthropathy, unspecified, other specified sites   . Asymptomatic varicose veins   . Bladder incontinence   . Bowel incontinence   . Bronchitis   . Cervicalgia   . Edema   . Esophageal reflux   . Lumbago   . Obstructive sleep apnea (adult) (pediatric)   . Other malaise and fatigue   . Pain in joint, shoulder region   . Radiculopathy of lumbar region   . Reactive airway disease   . Scoliosis (and kyphoscoliosis), idiopathic   . 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 Unspecified asthma(493.90)   . Unspecified disorder of lipoid metabolism   . Unspecified vitamin D deficiency   . Vertigo     Past Surgical History:  Procedure Laterality Date  . NECK SURGERY      There were no vitals filed for this visit.  Subjective Assessment - 06/04/17 1434    Subjective  "I'm good " No interpreter    Currently in Pain?  Yes    Pain Score  4     Pain Location  -- R shoulder and back                       [facility] Adult PT Treatment/Exercise - 06/04/17 0001      Lumbar Exercises: Aerobic   Stationary Bike  L0 x6 min    UBE (Upper Arm Bike)  L3  3 min fwd/3 minback      Lumbar Exercises: Machines for Strengthening   Cybex Knee Extension  10lb 2x10    Cybex Knee Flexion  25lb 2x10    Other Lumbar Machine Exercise  Lats & Rows 20lb 2x10      Lumbar Exercises: Standing   Row  15 reps;Theraband;Both x2    Theraband Level (Row)  Level 3 (Green)    Shoulder Extension  15 reps;Both x2    Theraband Level (Shoulder Extension)  Level 3 (Green)    Other Standing Lumbar Exercises  overhead ext 2x10, trunk rotatiions 2x10       Moist Heat Therapy   Number Minutes Moist Heat  15 Minutes    Moist Heat Location  Cervical;Lumbar Spine               PT Short Term Goals - 01/25/17 1041      PT SHORT TERM GOAL #1   Title  pt to be I with inital HEP     Time  3    Period  Weeks    Status  Achieved      PT SHORT TERM GOAL #2   Title  verbalize and demo proper posture to  prevent and reduce low back, neck and shoulder pain     Baseline  continued cues for proper form    Time  3    Period  Weeks    Status  Partially Met        PT Long Term Goals - 06/04/17 1503      PT LONG TERM GOAL #1   Title  pt to increase R shoulder AROM to 100 degrees for better ADL's    Status  Achieved      PT LONG TERM GOAL #2   Title  increase R shoulder strength to 4-/5 without pain > 4/10    Status  Achieved      PT LONG TERM GOAL #3   Title  increase lumbar ROM 25%    Status  Achieved      PT LONG TERM GOAL #4   Title  may return to walking for exercise or her gym at her apartment    Status  Partially Met            Plan - 06/04/17 1511    Clinical Impression Statement  Most goals met. She does really well with all exercises with good strength and ROM. Despite she does continue to report R shoulder and LBP. She reports that the heat is what helps the most. Explained to pt over the past few treatments that the heat is not considered a skilled service and we have to discharge PT since functionally she is fine.      Rehab Potential  Fair    PT Frequency  2x / week    PT Duration  8 weeks    PT Treatment/Interventions  ADLs/Self Care Home  Management;Electrical Stimulation;Cryotherapy;Iontophoresis 32m/ml Dexamethasone;Moist Heat;Ultrasound;Therapeutic exercise;Therapeutic activities;Dry needling;Taping;Manual techniques;Neuromuscular re-education;Passive range of motion;Patient/family education    PT Next Visit Plan  d/C PT       Patient will benefit from skilled therapeutic intervention in order to improve the following deficits and impairments:  Pain, Postural dysfunction, Improper body mechanics, Decreased mobility, Decreased strength, Decreased range of motion, Increased fascial restricitons, Increased muscle spasms, Decreased activity tolerance, Impaired UE functional use  Visit Diagnosis: Abnormal posture  Stiffness of right shoulder, not elsewhere classified  Muscle spasm of back  Chronic right shoulder pain     Problem List Patient Active Problem List   Diagnosis Date Noted  . MCI (mild cognitive impairment) 10/23/2016  . Chronic cough 10/19/2016  . Moderate single current episode of major depressive disorder ([place] 12/20/2015  . Other seasonal allergic rhinitis 03/31/2015  . 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 Muscle tension headache 10/06/2014  . Depression (emotion) 10/06/2014  . Dementia arising in the senium and presenium 10/06/2014  . Hypersomnia with sleep apnea 10/06/2014  . Neuropathy 07/08/2012  . Back pain 07/08/2012  . HYPERCHOLESTEROLEMIA 04/06/2010  . OVERWEIGHT 04/05/2010  . SKIN RASH 03/08/2010  . VERTIGO 05/06/2009  . CONJUNCTIVITIS, ALLERGIC 02/03/2009  . DOE (dyspnea on exertion) 11/03/2008  . SLEEP APNEA, OBSTRUCTIVE, MILD 07/28/2008  . VITAMIN D DEFICIENCY 06/25/2008  . FATIGUE 06/23/2008  . EDEMA 06/23/2008  . ALLERGIC RHINITIS 05/10/2008  . SHOULDER PAIN, RIGHT 04/22/2008  . NECK PAIN 10/27/2007  . REACTIVE AIRWAY DISEASE 10/01/2007  . PELVIC  PAIN 10/01/2007  . 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 POSTMENOPAUSAL STATUS 08/18/2007  . SCOLIOSIS, LUMBAR SPINE  08/18/2007  . VARICOSE VEINS, LOWER EXTREMITIES 07/28/2007  . GERD 07/28/2007  . 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 LUMBAGO 07/28/2007  .  POSITIVE PPD 05/19/2007   PHYSICAL THERAPY DISCHARGE SUMMARY  Visits from Start of Care: 24  Plan: Patient agrees to discharge.  Patient goals were partially met. Patient is being discharged due to lack of progress.  ?????     Scot Jun, PTA 06/04/2017, 3:17 PM  [place], [place], [address] Phone: [phone]   Fax:  [phone]  Name: STEVI HOLLINSHEAD MRN: [id number] Date of Birth: 1955-05-17

## 2017-06-18 ENCOUNTER — Encounter (HOSPITAL_COMMUNITY): Payer: Self-pay | Admitting: Emergency Medicine

## 2017-06-18 ENCOUNTER — Ambulatory Visit (INDEPENDENT_AMBULATORY_CARE_PROVIDER_SITE_OTHER): Payer: Medicaid Other

## 2017-06-18 ENCOUNTER — Ambulatory Visit (HOSPITAL_COMMUNITY)
Admission: EM | Admit: 2017-06-18 | Discharge: 2017-06-18 | Disposition: A | Discharge status: Home / Self Care | Payer: Medicaid Other | Attending: Family Medicine | Admitting: Family Medicine

## 2017-06-18 DIAGNOSIS — R059 Cough, unspecified: Secondary | ICD-10-CM

## 2017-06-18 DIAGNOSIS — R05 Cough: Secondary | ICD-10-CM | POA: Diagnosis not present

## 2017-06-18 MED ORDER — PREDNISONE 50 MG PO TABS: 50.0000 mg | ORAL_TABLET | Freq: Every day | ORAL | 0 refills | Status: AC

## 2017-06-18 MED ORDER — BENZONATATE 200 MG PO CAPS: 200.0000 mg | ORAL_CAPSULE | Freq: Three times a day (TID) | ORAL | 0 refills | Status: DC | PRN

## 2017-06-18 MED ORDER — ALBUTEROL SULFATE HFA 108 (90 BASE) MCG/ACT IN AERS: 2.0000 | INHALATION_SPRAY | RESPIRATORY_TRACT | 0 refills | Status: DC | PRN

## 2017-06-18 NOTE — Discharge Instructions (Addendum)
Please use albuterol inhaler as needed every 4-6 hours; 1-2 puffs  Prednisone daily for 5 days with food  Tessalon as needed for cough

## 2017-06-18 NOTE — ED Triage Notes (Signed)
Pt here for cough with blood tinged sputum

## 2017-06-18 NOTE — ED Provider Notes (Signed)
[facility]    CSN: [id number] Arrival date & time: 06/18/17  1136     History   Chief Complaint Chief Complaint  Patient presents with  . Cough    HPI Kayla Davies is a 62 y.o. female history of allergic rhinitis, bronchitis, OSA presenting today for evaluation of cough.  Cough is been present for 1/2 months.  Has been using over-the-counter cough syrups without relief.  Has had some subjective fevers feeling very fatigued and occasional hot and cold chills.  She has a fire sensation in her chest.  Endorsing some congestion as well.   HPI  Past Medical History:  Diagnosis Date  . Allergic rhinitis, cause unspecified   . Alzheimer's dementia   . Arthropathy, unspecified, other specified sites   . Asymptomatic varicose veins   . Bladder incontinence   . Bowel incontinence   . Bronchitis   . Cervicalgia   . Edema   . Esophageal reflux   . Lumbago   . Obstructive sleep apnea (adult) (pediatric)   . Other malaise and fatigue   . Pain in joint, shoulder region   . Radiculopathy of lumbar region   . Reactive airway disease   . Scoliosis (and kyphoscoliosis), idiopathic   . 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 Unspecified asthma(493.90)   . Unspecified disorder of lipoid metabolism   . Unspecified vitamin D deficiency   . Vertigo     Patient Active Problem List   Diagnosis Date Noted  . MCI (mild cognitive impairment) 10/23/2016  . Chronic cough 10/19/2016  . Moderate single current episode of major depressive disorder ([facility]) 12/20/2015  . Other seasonal allergic rhinitis 03/31/2015  . 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 Muscle tension headache 10/06/2014  . Depression (emotion) 10/06/2014  . Dementia arising in the senium and presenium 10/06/2014  . Hypersomnia with sleep apnea 10/06/2014  . Neuropathy 07/08/2012  . Back pain 07/08/2012  . HYPERCHOLESTEROLEMIA 04/06/2010  . OVERWEIGHT 04/05/2010  . SKIN RASH 03/08/2010  . VERTIGO 05/06/2009  .  CONJUNCTIVITIS, ALLERGIC 02/03/2009  . DOE (dyspnea on exertion) 11/03/2008  . SLEEP APNEA, OBSTRUCTIVE, MILD 07/28/2008  . VITAMIN D DEFICIENCY 06/25/2008  . FATIGUE 06/23/2008  . EDEMA 06/23/2008  . ALLERGIC RHINITIS 05/10/2008  . SHOULDER PAIN, RIGHT 04/22/2008  . NECK PAIN 10/27/2007  . REACTIVE AIRWAY DISEASE 10/01/2007  . PELVIC  PAIN 10/01/2007  . 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 POSTMENOPAUSAL STATUS 08/18/2007  . SCOLIOSIS, LUMBAR SPINE 08/18/2007  . VARICOSE VEINS, LOWER EXTREMITIES 07/28/2007  . GERD 07/28/2007  . 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 LUMBAGO 07/28/2007  . POSITIVE PPD 05/19/2007    Past Surgical History:  Procedure Laterality Date  . NECK SURGERY      OB History   None      Home Medications    Prior to Admission medications   Medication Sig Start Date End Date Taking? Authorizing Provider  albuterol (PROVENTIL HFA) 108 (90 Base) MCG/ACT inhaler Inhale 2 puffs into the lungs every 4 (four) hours as needed. For cough or SOB 06/18/17   Wieters, Hallie C, PA-C  benzonatate (TESSALON) 200 MG capsule Take 1 capsule (200 mg total) by mouth 3 (three) times daily as needed for cough. 06/18/17   Wieters, Hallie C, PA-C  budesonide (PULMICORT FLEXHALER) 180 MCG/ACT inhaler Inhale 1 puff into the lungs daily. 01/04/17   Oretha Milch, MD  cetirizine (ZYRTEC) 10 MG tablet Take 1 tablet (10 mg total) by  mouth daily as needed for allergies. 06/16/12   Moreno-Coll, Adlih, MD  DEXILANT 60 MG capsule TK 1 C PO QD 07/29/14   [provider]  lansoprazole (PREVACID) 15 MG capsule Take 1 capsule (15 mg total) by mouth daily. 04/17/17   Lars Masson, MD  meloxicam (MOBIC) 7.5 MG tablet 1-2 tabs by mouth daily with food as needed for pain. Do not take ibuprofen with this medicine. 06/24/12   Acey Lav, MD  methocarbamol (ROBAXIN) 500 MG tablet Take 1 tablet (500 mg total) by mouth 4 (four) times daily. 10/24/16   Renne Crigler, PA-C  Omega-3 Fatty Acids (FISH OIL) 1000 MG CPDR Take 2 g by mouth daily. 04/17/17   Lars Masson, MD  predniSONE (DELTASONE) 50 MG tablet Take 1 tablet (50 mg total) by mouth daily for 5 days. 06/18/17 06/23/17  Wieters, Hallie C, PA-C  Red Yeast Rice 600 MG CAPS Take 1 capsule (600 mg total) by mouth daily. 04/17/17   Lars Masson, MD    Family History History reviewed. No pertinent family history.  Social History Social History   Tobacco Use  . Smoking status: Never Smoker  . Smokeless tobacco: Never Used  Substance Use Topics  . Alcohol use: No  . Drug use: No     Allergies   Patient has no known allergies.   Review of Systems Review of Systems  Constitutional: Positive for chills, fatigue and fever. Negative for activity change and appetite change.  HENT: Positive for congestion, rhinorrhea and sore throat. Negative for ear pain, sinus pressure and trouble swallowing.   Respiratory: Positive for cough. Negative for chest tightness and shortness of breath.   Cardiovascular: Negative for chest pain.  Gastrointestinal: Negative for abdominal pain, nausea and vomiting.  Musculoskeletal: Negative for myalgias.  Skin: Negative for rash.  Neurological: Negative for dizziness, light-headedness and headaches.     Physical Exam Triage Vital Signs ED Triage Vitals [06/18/17 1228]  Enc Vitals Group     BP (!) 146/110     Pulse Rate 75     Resp 18     Temp 98.3 F (36.8 C)     Temp Source Oral     SpO2 100 %     Weight      Height      Head Circumference      Peak Flow      Pain Score      Pain Loc      Pain Edu?      Excl. in GC?    No data found.  Updated Vital Signs BP (!) 146/110 (BP Location: Left Arm)   Pulse 75   Temp 98.3 F (36.8 C) (Oral)   Resp 18   SpO2 100%   Visual Acuity Right Eye Distance:   Left Eye Distance:   Bilateral Distance:    Right Eye Near:   Left Eye Near:    Bilateral Near:     Physical Exam    Constitutional: She appears well-developed and well-nourished. No distress.  HENT:  Head: Normocephalic and atraumatic.  Bilateral ears without tenderness to palpation of external auricle, tragus and mastoid, EAC's without erythema or swelling, TM's with good bony landmarks and cone of light. Non erythematous.  Oral mucosa pink and moist, no tonsillar enlargement or exudate. Posterior pharynx patent and nonerythematous, no uvula deviation or swelling. Normal phonation.   Eyes: Conjunctivae are normal.  Neck: Neck supple.  Cardiovascular: Normal rate and regular rhythm.  No murmur heard. Pulmonary/Chest: Effort normal and breath sounds normal. No respiratory distress.  Breathing comfortably at rest, CTABL, no wheezing, rales or other adventitious sounds auscultated  Abdominal: Soft. There is no tenderness.  Musculoskeletal: She exhibits no edema.  No signs of edema to lower extremities  Neurological: She is alert.  Skin: Skin is warm and dry.  Psychiatric: She has a normal mood and affect.  Nursing note and vitals reviewed.    UC Treatments / Results  Labs (all labs ordered are listed, but only abnormal results are displayed) Labs Reviewed - No data to display  EKG None  Radiology Dg Chest 2 View  Result Date: 06/18/2017 CLINICAL DATA:  Cough 1.5 months EXAM: CHEST - 2 VIEW COMPARISON:  10/19/2016 FINDINGS: Mild cardiac enlargement without heart failure or edema. Negative for infiltrate or effusion. Lungs are clear. IMPRESSION: Cardiac enlargement without acute abnormality. Electronically Signed   By: Marlan Palau M.D.   On: 06/18/2017 13:22    Procedures Procedures (including critical care time)  Medications Ordered in UC Medications - No data to display  Initial Impression / Assessment and Plan / UC Course  I have reviewed the triage vital signs and the nursing notes.  Pertinent labs & imaging results that were available during my care of the patient were reviewed  by me and considered in my medical decision making (see chart for details).     Chest x-ray negative for acute cause of coughing.  Cough likely related to chronic bronchitis.  Patient without tachycardia, oxygen 100%, PE less likely.  At this time we will go ahead and continue symptomatically treat the cough.  Tessalon for cough, prednisone for 5 days, albuterol inhaler.Discussed strict return precautions. Patient verbalized understanding and is agreeable with plan.  Final Clinical Impressions(s) / UC Diagnoses   Final diagnoses:  Cough     Discharge Instructions     Please use albuterol inhaler as needed every 4-6 hours; 1-2 puffs  Prednisone daily for 5 days with food  Tessalon as needed for cough    ED Prescriptions    Medication Sig Dispense Auth. Provider   albuterol (PROVENTIL HFA) 108 (90 Base) MCG/ACT inhaler Inhale 2 puffs into the lungs every 4 (four) hours as needed. For cough or SOB 6.7 g Wieters, Hallie C, PA-C   benzonatate (TESSALON) 200 MG capsule Take 1 capsule (200 mg total) by mouth 3 (three) times daily as needed for cough. 45 capsule Wieters, Hallie C, PA-C   predniSONE (DELTASONE) 50 MG tablet Take 1 tablet (50 mg total) by mouth daily for 5 days. 5 tablet Wieters, Hallie C, PA-C     Controlled Substance Prescriptions [place] Controlled Substance Registry consulted? Not Applicable   Lew Dawes, [place] 06/18/17 1355

## 2017-07-03 ENCOUNTER — Ambulatory Visit: Payer: Medicaid Other | Admitting: Adult Health

## 2017-07-05 ENCOUNTER — Ambulatory Visit: Payer: Medicaid Other | Admitting: Adult Health

## 2017-07-15 ENCOUNTER — Encounter: Payer: Self-pay | Admitting: Pulmonary Disease

## 2017-07-15 ENCOUNTER — Ambulatory Visit (INDEPENDENT_AMBULATORY_CARE_PROVIDER_SITE_OTHER): Payer: Medicaid Other | Admitting: Pulmonary Disease

## 2017-07-15 ENCOUNTER — Telehealth: Payer: Self-pay

## 2017-07-15 DIAGNOSIS — R05 Cough: Secondary | ICD-10-CM | POA: Diagnosis not present

## 2017-07-15 DIAGNOSIS — R053 Chronic cough: Secondary | ICD-10-CM

## 2017-07-15 MED ORDER — BENZONATATE 200 MG PO CAPS: 200.0000 mg | ORAL_CAPSULE | Freq: Three times a day (TID) | ORAL | 0 refills | Status: DC | PRN

## 2017-07-15 MED ORDER — BUDESONIDE 180 MCG/ACT IN AEPB: 1.0000 | INHALATION_SPRAY | Freq: Every day | RESPIRATORY_TRACT | 2 refills | Status: AC

## 2017-07-15 NOTE — Patient Instructions (Signed)
Refills on Pulmicort.  Refills on benzonatate capsule 200 mg twice daily as needed. Take Delsym cough syrup 5 mL twice daily as needed  Call us if cough gets really worse

## 2017-07-15 NOTE — Progress Notes (Signed)
   Subjective:    Patient ID: Kayla Davies, female    DOB: 12/09/1955, 62 y.o.   MRN: [id number]  HPI  62 yo never smoker , [name] , followed for chronic cough for 6 years . Seen by consult 10/19/16 .  Seen previously in 2012 withDr. WrightFor UAC d/t GERD .  Accompanied by interpretor. She was seen by gastroenterology and underwent a endoscopy that showed gastritis. All her testing below has been negative so far as listed below She had a flareup of her cough and required an ER visit.  She was making a peculiar sound or coughing.  I have reviewed ER data, she was given prednisone for 5 days which provided some symptomatic benefit she is not sure if Tessalon Perles helped or not  Cough remains dry and she points to her throat as the origin of the cough   Significant tests/ events reviewed  Allergy profile was negative with IgE at<2. QuantiFERON gold was negative.  11/2016 HRCT chest >>nml lung parenchyma FENO  >> unable to perform  Swallow 04/2017 small slow bites and sips and only eat when sitting upright    Review of Systems neg for any significant sore throat, dysphagia, itching, sneezing, nasal congestion or excess/ purulent secretions, fever, chills, sweats, unintended wt loss, pleuritic or exertional cp, hempoptysis, orthopnea pnd or change in chronic leg swelling. Also denies presyncope, palpitations, heartburn, abdominal pain, nausea, vomiting, diarrhea or change in bowel or urinary habits, dysuria,hematuria, rash, arthralgias, visual complaints, headache, numbness weakness or ataxia.     Objective:   Physical Exam   Gen. Pleasant, thin, in no distress ENT - no thrush, no post nasal drip Neck: No JVD, no thyromegaly, no carotid bruits Lungs: no use of accessory muscles, no dullness to percussion, clear without rales or rhonchi  Cardiovascular: Rhythm regular, heart sounds  normal, no murmurs or gallops, no peripheral edema Musculoskeletal:  No deformities, no cyanosis or clubbing         Assessment & Plan:

## 2017-07-15 NOTE — Assessment & Plan Note (Signed)
I am not sure we understand what is causing her chronic cough.  She has been treated for reflux without any significant benefit.  Initially I did treat for upper airway cough with antihistaminics as decongestant combination but I am not sure if she actually took this treatment or not.  There is no signs of latent or active tuberculosis.  Could this be a neurogenic cough? This does not seem to be cough variant asthma but she has some subjective benefit with Pulmicort, unable to perform methacholine challenge test since she is unable to perform spirometry maneuvers we will treat symptomatically for now  Refills on Pulmicort.  Refills on benzonatate capsule 200 mg twice daily as needed. Take Delsym cough syrup 5 mL twice daily as needed  Call us if cough gets really worse

## 2017-07-15 NOTE — Telephone Encounter (Signed)
PA for Pulmicort 180mcg initiated through [employer]. They could not make a determination and we will have to call back within twenty four hours to see if it has been approved.  PA number [id number] Interaction ID [id number]  If PA is not approved then the alternatives are Pulmicort respules or Flovent. Will route to CP to follow up.

## 2017-07-17 NOTE — Telephone Encounter (Signed)
44 mcg - 1 puff twice daily

## 2017-07-17 NOTE — Telephone Encounter (Signed)
Called [employer] at [phone]. Spoke with Pilgrim's PrideMonique. PA has been denied. Pt has to try and fail 2 of the preferred medications. Preferred medications are >> Flovent HFA, Pulmicort Respules 0.25mg /0.5mg /1mg .  Dr. Vassie LollAlva - please advise. Thanks.

## 2017-07-17 NOTE — Telephone Encounter (Signed)
ATC pt-no answer with no option to leave vm. Will call back.  

## 2017-07-17 NOTE — Telephone Encounter (Signed)
Flovent HFA does not come in 88mcg inhaler. It comes in 44mcg, 110mcg and 220mcg.  Dr. Vassie LollAlva - please advise.

## 2017-07-17 NOTE — Telephone Encounter (Signed)
Flovent HFA would be fine-88 mcg 1 puff twice daily

## 2017-07-19 MED ORDER — FLUTICASONE PROPIONATE HFA 44 MCG/ACT IN AERO: 1.0000 | INHALATION_SPRAY | Freq: Two times a day (BID) | RESPIRATORY_TRACT | 12 refills | Status: AC

## 2017-07-19 NOTE — Telephone Encounter (Signed)
ATC pt- no answer will call back.

## 2017-07-19 NOTE — Telephone Encounter (Signed)
Returned call to Patient.  Informed her of Flovent HFA , 1 puff, BID.  Prescription sent to preferred pharmacy [facility] [address]  Patient stated understanding.

## 2017-07-19 NOTE — Telephone Encounter (Signed)
Patient returning call, CB is [phone].

## 2017-07-22 NOTE — Telephone Encounter (Signed)
Called and spoke with pt's daughter [place] regarding PA for RX refills.  Called [employer] at [phone], spoke with Kenard Gowerrew. PA has been denied for Pulmicort; RA recommends use Flovent HFA 44mcg instead. Pt has to try and fail 2 of the preferred medications in order for Pulmicort to be approved. Also spoke with Kenard Gowerrew about PA for benzonatate 200mg  for cough. This is denied. Pt ID [id number], Call ID [phone]  Pt's daughter-Jordan verbalized understanding that both Rx has been denied for PA. [place] asked if something other than Benzonatate could be prescribed for pt's cough.  RA please advise?

## 2017-07-22 NOTE — Telephone Encounter (Signed)
Pt is calling back [phone]

## 2017-07-22 NOTE — Telephone Encounter (Signed)
Delsym 5 mL twice daily as needed. Flovent 44 mEq 1 puff twice daily

## 2017-07-22 NOTE — Telephone Encounter (Signed)
Attempted to call patient today regarding RA recommendations for cough. I did not receive an answer at time of call. I have left a voicemail message for pt to return call. X1

## 2017-11-15 ENCOUNTER — Ambulatory Visit: Payer: Medicaid Other | Admitting: Adult Health

## 2017-12-13 ENCOUNTER — Ambulatory Visit (INDEPENDENT_AMBULATORY_CARE_PROVIDER_SITE_OTHER): Payer: Medicaid Other | Admitting: Pulmonary Disease

## 2017-12-13 ENCOUNTER — Encounter: Payer: Self-pay | Admitting: Pulmonary Disease

## 2017-12-13 DIAGNOSIS — Z23 Encounter for immunization: Secondary | ICD-10-CM

## 2017-12-13 DIAGNOSIS — R05 Cough: Secondary | ICD-10-CM

## 2017-12-13 DIAGNOSIS — R053 Chronic cough: Secondary | ICD-10-CM

## 2017-12-13 DIAGNOSIS — K219 Gastro-esophageal reflux disease without esophagitis: Secondary | ICD-10-CM | POA: Diagnosis not present

## 2017-12-13 NOTE — Progress Notes (Signed)
   Subjective:    Patient ID: Kayla Davies, female    DOB: 07/20/1955, 62 y.o.   MRN: [id number]  HPI  62 yo never smoker , [name] , followed for chronic cough Seen previously in 2012 withDr. WrightFor [facility] d/t GERD .  Chief Complaint  Patient presents with  . Follow-up    4 month follow up. States her breathing has been ok since her last visit but she has noticed a difference since the weather has changed.      Accompanied by interpreter & daughter Her chronic cough has been attributed to upper airway cough syndrome and GERD. Steroid inhaler has provided her some relief when she has periodic flareups.  She seems to do better in the summer than in the cold weather.  Daughter reports that she is still coughing intermittently and she remains tired and fatigued.  No fevers, no sputum production or wheezing.  Med rec shows both Pulmicort and Flovent inhalers and I was not able to tease out which when she is taking.  She seems to have stopped taking Prevacid or Dexilant and she has run out of [employer] Perles.  Does not report overt reflux She also reports substernal mild chest pain occasionally, I note evaluation by cardiology in the past including EKG and CT coronaries     Significant tests/ events reviewed  Allergy profile was negative with IgE at<2. QuantiFERON gold was negative.  11/2016 HRCT chest>>nml lung parenchyma FENO >>unable to perform  EGD 2019 showed gastritis. Swallow 04/2017 small slow bites and sips and only eat when sitting upright   Review of Systems neg for any significant sore throat, dysphagia, itching, sneezing, nasal congestion or excess/ purulent secretions, fever, chills, sweats, unintended wt loss, pleuritic or exertional cp, hempoptysis, orthopnea pnd or change in chronic leg swelling. Also denies presyncope, palpitations, heartburn, abdominal pain, nausea, vomiting, diarrhea or change in bowel or urinary habits,  dysuria,hematuria, rash, arthralgias, visual complaints, headache, numbness weakness or ataxia.     Objective:   Physical Exam  Gen. Pleasant, well-nourished, in no distress ENT - no thrush, no post nasal drip Neck: No JVD, no thyromegaly, no carotid bruits Lungs: no use of accessory muscles, no dullness to percussion, clear without rales or rhonchi  Cardiovascular: Rhythm regular, heart sounds  normal, no murmurs or gallops, no peripheral edema Musculoskeletal: No deformities, no cyanosis or clubbing        Assessment & Plan:

## 2017-12-13 NOTE — Assessment & Plan Note (Signed)
Flu shot today. We will continue steroid inhaler since this has provided subjective relief.  Do think her chronic cough is a combination of upper airway cough syndrome and GERD Refills on Pulmicort, Prevacid and benzonatate Perles x5

## 2017-12-13 NOTE — Patient Instructions (Signed)
Flu shot today.  Refills on Pulmicort, Prevacid and benzonatate Perles x5

## 2017-12-13 NOTE — Assessment & Plan Note (Signed)
Refills on Prevacid

## 2017-12-17 ENCOUNTER — Telehealth: Payer: Self-pay | Admitting: Pulmonary Disease

## 2017-12-17 MED ORDER — ALBUTEROL SULFATE HFA 108 (90 BASE) MCG/ACT IN AERS: 2.0000 | INHALATION_SPRAY | RESPIRATORY_TRACT | 0 refills | Status: DC | PRN

## 2017-12-17 NOTE — Telephone Encounter (Signed)
Called and spoke with patients daughter, she stated that she was supposed to get an inhaler. Advised patient I did not see where one was sent in RA wanted her to continue current medications. Patients daughter then advised that she needed a refill of the albuterol inhaler. Refill sent. Nothing further needed.

## 2018-02-19 ENCOUNTER — Other Ambulatory Visit: Payer: Self-pay | Admitting: Pulmonary Disease

## 2018-04-22 ENCOUNTER — Ambulatory Visit: Payer: Medicaid Other

## 2018-05-07 ENCOUNTER — Ambulatory Visit: Payer: Self-pay | Admitting: Physical Therapy

## 2018-05-28 ENCOUNTER — Other Ambulatory Visit: Payer: Self-pay | Admitting: Pulmonary Disease

## 2018-06-03 ENCOUNTER — Ambulatory Visit: Payer: Self-pay | Admitting: Physical Therapy

## 2018-06-26 ENCOUNTER — Ambulatory Visit: Payer: Self-pay | Admitting: Physical Therapy

## 2018-10-20 ENCOUNTER — Ambulatory Visit: Payer: Medicare Other | Attending: Internal Medicine | Admitting: Physical Therapy

## 2018-10-20 ENCOUNTER — Other Ambulatory Visit: Payer: Self-pay

## 2018-10-20 DIAGNOSIS — M545 Low back pain, unspecified: Secondary | ICD-10-CM

## 2018-10-20 DIAGNOSIS — G8929 Other chronic pain: Secondary | ICD-10-CM | POA: Insufficient documentation

## 2018-10-20 DIAGNOSIS — R293 Abnormal posture: Secondary | ICD-10-CM | POA: Insufficient documentation

## 2018-10-20 DIAGNOSIS — M542 Cervicalgia: Secondary | ICD-10-CM | POA: Diagnosis present
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
## 2018-10-28 ENCOUNTER — Ambulatory Visit: Payer: Medicare Other | Admitting: Physical Therapy

## 2018-10-30 ENCOUNTER — Other Ambulatory Visit: Payer: Self-pay

## 2018-10-30 ENCOUNTER — Ambulatory Visit: Payer: Medicare Other | Attending: Family Medicine | Admitting: Physical Therapy

## 2018-10-30 DIAGNOSIS — R293 Abnormal posture: Secondary | ICD-10-CM | POA: Diagnosis present

## 2018-10-30 DIAGNOSIS — M25611 Stiffness of right shoulder, not elsewhere classified: Secondary | ICD-10-CM | POA: Diagnosis present

## 2018-10-30 DIAGNOSIS — M545 Low back pain: Secondary | ICD-10-CM | POA: Diagnosis present

## 2018-10-30 DIAGNOSIS — G8929 Other chronic pain: Secondary | ICD-10-CM

## 2018-10-30 DIAGNOSIS — M542 Cervicalgia: Secondary | ICD-10-CM | POA: Diagnosis present

## 2018-10-30 NOTE — Therapy (Signed)
[facility] [place], [place], [address] Phone: [phone]   Fax:  [phone]  Physical Therapy Treatment  Patient Details  Name: Kayla Davies MRN: [id number] Date of Birth: 1955-08-11 Referring Provider (PT): Randall Hiss Dean/   Encounter Date: 10/30/2018  PT End of Session - 10/30/18 0823    Visit Number  2    Date for PT Re-Evaluation  11/17/18    PT Start Time  0811    PT Stop Time  0845    PT Time Calculation (min)  34 min       Past Medical History:  Diagnosis Date  . Allergic rhinitis, cause unspecified   . Alzheimer's dementia ([place])   . Arthropathy, unspecified, other specified sites   . Asymptomatic varicose veins   . Bladder incontinence   . Bowel incontinence   . Bronchitis   . Cervicalgia   . Edema   . Esophageal reflux   . Lumbago   . Obstructive sleep apnea (adult) (pediatric)   . Other malaise and fatigue   . Pain in joint, shoulder region   . Radiculopathy of lumbar region   . Reactive airway disease   . Scoliosis (and kyphoscoliosis), idiopathic   . 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 Unspecified asthma(493.90)   . Unspecified disorder of lipoid metabolism   . Unspecified vitamin D deficiency   . Vertigo     Past Surgical History:  Procedure Laterality Date  . NECK SURGERY      There were no vitals filed for this visit.  Subjective Assessment - 10/30/18 0813    Subjective  no show last visit and 11 min late today. reports no changes back and shoulder. reports doing HEP    Patient is accompained by:  Family member    Currently in Pain?  Yes    Pain Score  7     Pain Location  Back    Pain Type  Other (Comment)                       [facility] Adult PT Treatment/Exercise - 10/30/18 0001      Exercises   Exercises  Shoulder;Lumbar;Neck      Neck Exercises: Standing   Other Standing Exercises  ball vs wall 5 times CW and CCW    Other Standing Exercises  3# cane  ex 10 each      Lumbar Exercises: Aerobic   Nustep  L4 5 min      Lumbar Exercises: Machines for Strengthening   Cybex Lumbar Extension  black band flex and ext 2 sets 10    Other Lumbar Machine Exercise  seated row 2 sets 10 15#    Other Lumbar Machine Exercise  lat pull down 2 sets 10 15#               PT Short Term Goals - 10/30/18 6754      PT SHORT TERM GOAL #1   Title  pt to be I with inital HEP    Status  Achieved               Plan - 10/30/18 0824    Clinical Impression Statement  daughter present to help translate but pt did fair with demo of ex.verb and tactile cuing needed. pt completed all ex but with facial grimaces. pt verb doing HEP STG 1 met    PT Treatment/Interventions  ADLs/Self Care Home Management;Cryotherapy;Dealer  Stimulation;Moist Heat;Traction;Therapeutic exercise;Patient/family education;Manual techniques;Dry needling;Ultrasound    PT Next Visit Plan  postural strengtheing, LE strengthening; modalities for pain       Patient will benefit from skilled therapeutic intervention in order to improve the following deficits and impairments:  Pain, Decreased mobility, Postural dysfunction, Decreased activity tolerance, Decreased range of motion, Decreased strength, Impaired flexibility  Visit Diagnosis: Cervicalgia  Abnormal posture  Chronic midline low back pain, unspecified whether sciatica present  Stiffness of right shoulder, not elsewhere classified     Problem List Patient Active Problem List   Diagnosis Date Noted  . MCI (mild cognitive impairment) 10/23/2016  . Chronic cough 10/19/2016  . Moderate single current episode of major depressive disorder ([place]) 12/20/2015  . Other seasonal allergic rhinitis 03/31/2015  . Depression due to dementia ([place]) 03/31/2015  . Muscle tension headache 10/06/2014  . Depression (emotion) 10/06/2014  . Dementia arising in the senium and presenium ([place]) 10/06/2014  . Hypersomnia with sleep  apnea 10/06/2014  . Neuropathy 07/08/2012  . Back pain 07/08/2012  . HYPERCHOLESTEROLEMIA 04/06/2010  . OVERWEIGHT 04/05/2010  . SKIN RASH 03/08/2010  . VERTIGO 05/06/2009  . CONJUNCTIVITIS, ALLERGIC 02/03/2009  . DOE (dyspnea on exertion) 11/03/2008  . SLEEP APNEA, OBSTRUCTIVE, MILD 07/28/2008  . VITAMIN D DEFICIENCY 06/25/2008  . FATIGUE 06/23/2008  . EDEMA 06/23/2008  . ALLERGIC RHINITIS 05/10/2008  . SHOULDER PAIN, RIGHT 04/22/2008  . NECK PAIN 10/27/2007  . REACTIVE AIRWAY DISEASE 10/01/2007  . PELVIC  PAIN 10/01/2007  . 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 POSTMENOPAUSAL STATUS 08/18/2007  . SCOLIOSIS, LUMBAR SPINE 08/18/2007  . VARICOSE VEINS, LOWER EXTREMITIES 07/28/2007  . GERD 07/28/2007  . 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 LUMBAGO 07/28/2007  . POSITIVE PPD 05/19/2007    PAYSEUR,ANGIE PTA 10/30/2018, 8:40 AM  [place], [place], [address] Phone: [phone]   Fax:  [phone]  Name: CHIVON LEPAGE MRN: [id number] Date of Birth: [birth year]

## 2018-11-04 ENCOUNTER — Encounter: Payer: Self-pay | Admitting: Physical Therapy

## 2018-11-04 ENCOUNTER — Ambulatory Visit: Payer: Medicare Other | Admitting: Physical Therapy

## 2018-11-04 ENCOUNTER — Other Ambulatory Visit: Payer: Self-pay

## 2018-11-04 DIAGNOSIS — M545 Low back pain, unspecified: Secondary | ICD-10-CM

## 2018-11-04 DIAGNOSIS — M542 Cervicalgia: Secondary | ICD-10-CM | POA: Diagnosis not present

## 2018-11-04 DIAGNOSIS — R293 Abnormal posture: Secondary | ICD-10-CM

## 2018-11-04 DIAGNOSIS — G8929 Other chronic pain: Secondary | ICD-10-CM

## 2018-11-04 NOTE — Therapy (Signed)
[place], [place], [address] Phone: [phone]   Fax:  [phone]  Physical Therapy Treatment  Patient Details  Name: Kayla Davies MRN: [id number] Date of Birth: 03-09-1955 Referring Provider (PT): Randall Hiss Dean/   Encounter Date: 11/04/2018  PT End of Session - 11/04/18 0852    Visit Number  3    Number of Visits  4    Date for PT Re-Evaluation  11/17/18    PT Start Time  0814    PT Stop Time  0859    PT Time Calculation (min)  45 min    Activity Tolerance  Patient tolerated treatment well;Patient limited by pain    Behavior During Therapy  [facility] for tasks assessed/performed       Past Medical History:  Diagnosis Date  . Allergic rhinitis, cause unspecified   . Alzheimer's dementia ([place])   . Arthropathy, unspecified, other specified sites   . Asymptomatic varicose veins   . Bladder incontinence   . Bowel incontinence   . Bronchitis   . Cervicalgia   . Edema   . Esophageal reflux   . Lumbago   . Obstructive sleep apnea (adult) (pediatric)   . Other malaise and fatigue   . Pain in joint, shoulder region   . Radiculopathy of lumbar region   . Reactive airway disease   . Scoliosis (and kyphoscoliosis), idiopathic   . 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 Unspecified asthma(493.90)   . Unspecified disorder of lipoid metabolism   . Unspecified vitamin D deficiency   . Vertigo     Past Surgical History:  Procedure Laterality Date  . NECK SURGERY      There were no vitals filed for this visit.  Subjective Assessment - 11/04/18 0817    Subjective  "Pain, Back and shoulder"    Pertinent History  neck pain, shoulder pain, LBP    Currently in Pain?  Yes    Pain Score  8     Pain Location  Back                       [facility] Adult PT Treatment/Exercise - 11/04/18 0001      Lumbar Exercises: Aerobic   Nustep  L4 6 min      Lumbar Exercises: Machines for Strengthening   Cybex Lumbar Extension  black band flex and ext 2 sets 10    Cybex Knee Extension  5lb 2x10    Cybex Knee Flexion  15lb 2x10    Other Lumbar Machine Exercise  seated row 2 sets 10 15#    Other Lumbar Machine Exercise  lat pull down 2 sets 10 15#      Modalities   Modalities  Moist Heat      Moist Heat Therapy   Number Minutes Moist Heat  15 Minutes    Moist Heat Location  Shoulder;Lumbar Spine               PT Short Term Goals - 10/30/18 0822      PT SHORT TERM GOAL #1   Title  pt to be I with inital HEP    Status  Achieved               Plan - 11/04/18 0855    Clinical Impression Statement  Pt ~ 14 minutes late for today's treatment session. No family member of interpreter so language barrier present during treatment .  Pt did well following exercise examples. Tactile cueing needed  with seated rows and lat pull downs    Comorbidities  back pain, neck pain, right knee pain, shoulder pain    Examination-Activity Limitations  Bed Mobility;Bend;Sit;Stairs;Stand;Transfers    Stability/Clinical Decision Making  Evolving/Moderate complexity    Rehab Potential  Good    PT Frequency  1x / week    PT Duration  4 weeks    PT Treatment/Interventions  ADLs/Self Care Home Management;Cryotherapy;Electrical Stimulation;Moist Heat;Traction;Therapeutic exercise;Patient/family education;Manual techniques;Dry needling;Ultrasound    PT Next Visit Plan  postural strengthen, LE strengthening; modalities for pain       Patient will benefit from skilled therapeutic intervention in order to improve the following deficits and impairments:  Pain, Decreased mobility, Postural dysfunction, Decreased activity tolerance, Decreased range of motion, Decreased strength, Impaired flexibility  Visit Diagnosis: Cervicalgia  Abnormal posture  Chronic midline low back pain, unspecified whether sciatica present     Problem List Patient Active Problem List   Diagnosis Date Noted  . MCI  (mild cognitive impairment) 10/23/2016  . Chronic cough 10/19/2016  . Moderate single current episode of major depressive disorder (HCC) 12/20/2015  . Other seasonal allergic rhinitis 03/31/2015  . 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 Muscle tension headache 10/06/2014  . Depression (emotion) 10/06/2014  . Dementia arising in the senium and presenium (HCC) 10/06/2014  . Hypersomnia with sleep apnea 10/06/2014  . Neuropathy 07/08/2012  . Back pain 07/08/2012  . HYPERCHOLESTEROLEMIA 04/06/2010  . OVERWEIGHT 04/05/2010  . SKIN RASH 03/08/2010  . VERTIGO 05/06/2009  . CONJUNCTIVITIS, ALLERGIC 02/03/2009  . DOE (dyspnea on exertion) 11/03/2008  . SLEEP APNEA, OBSTRUCTIVE, MILD 07/28/2008  . VITAMIN D DEFICIENCY 06/25/2008  . FATIGUE 06/23/2008  . EDEMA 06/23/2008  . ALLERGIC RHINITIS 05/10/2008  . SHOULDER PAIN, RIGHT 04/22/2008  . NECK PAIN 10/27/2007  . REACTIVE AIRWAY DISEASE 10/01/2007  . PELVIC  PAIN 10/01/2007  . 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 POSTMENOPAUSAL STATUS 08/18/2007  . SCOLIOSIS, LUMBAR SPINE 08/18/2007  . VARICOSE VEINS, LOWER EXTREMITIES 07/28/2007  . GERD 07/28/2007  . 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 LUMBAGO 07/28/2007  . POSITIVE PPD 05/19/2007    Grayce Sessions, PTA 11/04/2018, 9:02 AM  [facility] [address] [place], [place], [address] Phone: [phone]   Fax:  [phone]  Name: Kayla Davies MRN: [id number] Date of Birth: 29-Aug-1955

## 2018-11-13 ENCOUNTER — Other Ambulatory Visit: Payer: Self-pay

## 2018-11-13 ENCOUNTER — Ambulatory Visit: Payer: Medicare Other | Admitting: Physical Therapy

## 2018-11-13 DIAGNOSIS — M542 Cervicalgia: Secondary | ICD-10-CM

## 2018-11-13 NOTE — Therapy (Signed)
[place], [place], [address] Phone: [phone]   Fax:  [phone]  Physical Therapy Treatment  Patient Details  Name: Kayla Davies MRN: [id number] Date of Birth: 1955-11-15 Referring Provider (PT): Randall Hiss Dean/   Encounter Date: 11/13/2018  PT End of Session - 11/13/18 0848    Visit Number  4    PT Start Time  0803    PT Stop Time  0853    PT Time Calculation (min)  50 min       Past Medical History:  Diagnosis Date  . Allergic rhinitis, cause unspecified   . Alzheimer's dementia ([place])   . Arthropathy, unspecified, other specified sites   . Asymptomatic varicose veins   . Bladder incontinence   . Bowel incontinence   . Bronchitis   . Cervicalgia   . Edema   . Esophageal reflux   . Lumbago   . Obstructive sleep apnea (adult) (pediatric)   . Other malaise and fatigue   . Pain in joint, shoulder region   . Radiculopathy of lumbar region   . Reactive airway disease   . Scoliosis (and kyphoscoliosis), idiopathic   . 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 Unspecified asthma(493.90)   . Unspecified disorder of lipoid metabolism   . Unspecified vitamin D deficiency   . Vertigo     Past Surgical History:  Procedure Laterality Date  . NECK SURGERY      There were no vitals filed for this visit.  Subjective Assessment - 11/13/18 0807    Subjective  "pain, back and shoulder" "pain is the same"    Currently in Pain?  Yes    Pain Score  8     Pain Location  Back                       [facility] Adult PT Treatment/Exercise - 11/13/18 0001      Neck Exercises: Standing   Wall Push Ups  5 reps    Other Standing Exercises  ball vs wall 5 times CW and CCW      Lumbar Exercises: Aerobic   Nustep  L4 7 min      Lumbar Exercises: Machines for Strengthening   Cybex Lumbar Extension  black band flex and ext 2 sets 10    Other Lumbar Machine Exercise  seated row 2 sets 10 15#    Other Lumbar Machine Exercise  lat pull down 2 sets 10 15#      Shoulder Exercises: Standing   Horizontal ABduction  10 reps    Theraband Level (Shoulder Horizontal ABduction)  Level 1 (Yellow)    External Rotation  10 reps    Theraband Level (Shoulder External Rotation)  Level 1 (Yellow)      Moist Heat Therapy   Number Minutes Moist Heat  15 Minutes    Moist Heat Location  Shoulder;Lumbar Spine               PT Short Term Goals - 11/13/18 8182      PT SHORT TERM GOAL #1   Title  pt to be I with inital HEP    Status  Achieved      PT SHORT TERM GOAL #2   Title  verbalize and demo proper posture to prevent and reduce low back, neck and shoulder pain     Baseline  requires cues for proper form    Status  Partially  Met      PT SHORT TERM GOAL #3   Title  Decreased pain in low back and neck by 25%  or more with ADLS    Status  Partially Met      PT SHORT TERM GOAL #4   Title  Improve 5 x sit to stand to 50 seconds    Baseline  40sec  with max cuing and effort               Plan - 11/13/18 0844    Clinical Impression Statement  pt arrived with no interperter so there was a language barrier. Pt requires demonstration and tactile cues to increase preformance of exercises.    Rehab Potential  Good    PT Frequency  1x / week    PT Duration  4 weeks    PT Treatment/Interventions  ADLs/Self Care Home Management;Cryotherapy;Electrical Stimulation;Moist Heat;Traction;Therapeutic exercise;Patient/family education;Manual techniques;Dry needling;Ultrasound       Patient will benefit from skilled therapeutic intervention in order to improve the following deficits and impairments:  Pain, Decreased mobility, Postural dysfunction, Decreased activity tolerance, Decreased range of motion, Decreased strength, Impaired flexibility  Visit Diagnosis: Cervicalgia     Problem List Patient Active Problem List   Diagnosis Date Noted  . MCI (mild cognitive impairment)  10/23/2016  . Chronic cough 10/19/2016  . Moderate single current episode of major depressive disorder ([place]) 12/20/2015  . Other seasonal allergic rhinitis 03/31/2015  . 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 Muscle tension headache 10/06/2014  . Depression (emotion) 10/06/2014  . Dementia arising in the senium and presenium ([place]) 10/06/2014  . Hypersomnia with sleep apnea 10/06/2014  . Neuropathy 07/08/2012  . Back pain 07/08/2012  . HYPERCHOLESTEROLEMIA 04/06/2010  . OVERWEIGHT 04/05/2010  . SKIN RASH 03/08/2010  . VERTIGO 05/06/2009  . CONJUNCTIVITIS, ALLERGIC 02/03/2009  . DOE (dyspnea on exertion) 11/03/2008  . SLEEP APNEA, OBSTRUCTIVE, MILD 07/28/2008  . VITAMIN D DEFICIENCY 06/25/2008  . FATIGUE 06/23/2008  . EDEMA 06/23/2008  . ALLERGIC RHINITIS 05/10/2008  . SHOULDER PAIN, RIGHT 04/22/2008  . NECK PAIN 10/27/2007  . REACTIVE AIRWAY DISEASE 10/01/2007  . PELVIC  PAIN 10/01/2007  . 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 POSTMENOPAUSAL STATUS 08/18/2007  . SCOLIOSIS, LUMBAR SPINE 08/18/2007  . VARICOSE VEINS, LOWER EXTREMITIES 07/28/2007  . GERD 07/28/2007  . 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 LUMBAGO 07/28/2007  . POSITIVE PPD 05/19/2007    Barrett Henle, [place] 11/13/2018, 8:51 AM  [place], [place], [address] Phone: [phone]   Fax:  [phone]  Name: Kayla Davies MRN: [id number] Date of Birth: [birth year]

## 2018-12-30 ENCOUNTER — Ambulatory Visit: Payer: Medicare Other | Admitting: Physical Therapy

## 2019-01-06 ENCOUNTER — Other Ambulatory Visit: Payer: Self-pay

## 2019-01-06 ENCOUNTER — Encounter: Payer: Self-pay | Admitting: Physical Therapy

## 2019-01-06 ENCOUNTER — Ambulatory Visit: Payer: Medicare Other | Attending: Physician Assistant | Admitting: Physical Therapy

## 2019-01-06 DIAGNOSIS — M25611 Stiffness of right shoulder, not elsewhere classified: Secondary | ICD-10-CM | POA: Insufficient documentation

## 2019-01-06 DIAGNOSIS — R293 Abnormal posture: Secondary | ICD-10-CM | POA: Diagnosis present

## 2019-01-06 DIAGNOSIS — M545 Low back pain: Secondary | ICD-10-CM | POA: Insufficient documentation

## 2019-01-06 DIAGNOSIS — G8929 Other chronic pain: Secondary | ICD-10-CM | POA: Diagnosis present

## 2019-01-06 DIAGNOSIS — M62830 Muscle spasm of back: Secondary | ICD-10-CM | POA: Insufficient documentation

## 2019-01-06 DIAGNOSIS — M542 Cervicalgia: Secondary | ICD-10-CM | POA: Diagnosis not present
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
## 2019-01-07 ENCOUNTER — Encounter: Payer: Medicare Other | Admitting: Physical Therapy

## 2019-01-12 ENCOUNTER — Ambulatory Visit: Payer: Medicare Other | Admitting: Physical Therapy

## 2019-01-13 ENCOUNTER — Encounter: Payer: Medicare Other | Admitting: Physical Therapy

## 2019-01-14 ENCOUNTER — Encounter: Payer: Medicare Other | Admitting: Physical Therapy

## 2019-01-20 ENCOUNTER — Ambulatory Visit: Payer: Medicare Other | Admitting: Physical Therapy

## 2019-01-21 ENCOUNTER — Ambulatory Visit: Payer: Medicare Other | Admitting: Physical Therapy

## 2019-05-20 ENCOUNTER — Other Ambulatory Visit: Payer: Self-pay | Admitting: Endocrinology

## 2019-05-20 DIAGNOSIS — M4180 Other forms of scoliosis, site unspecified: Secondary | ICD-10-CM

## 2019-05-20 DIAGNOSIS — M4722 Other spondylosis with radiculopathy, cervical region: Secondary | ICD-10-CM

## 2019-05-26 ENCOUNTER — Other Ambulatory Visit: Payer: Self-pay

## 2019-05-26 ENCOUNTER — Ambulatory Visit: Payer: Medicare Other | Attending: Internal Medicine | Admitting: Physical Therapy

## 2019-05-26 ENCOUNTER — Encounter: Payer: Self-pay | Admitting: Physical Therapy

## 2019-05-26 DIAGNOSIS — R293 Abnormal posture: Secondary | ICD-10-CM | POA: Diagnosis present

## 2019-05-26 DIAGNOSIS — G8929 Other chronic pain: Secondary | ICD-10-CM | POA: Diagnosis present

## 2019-05-26 DIAGNOSIS — M25611 Stiffness of right shoulder, not elsewhere classified: Secondary | ICD-10-CM | POA: Insufficient documentation

## 2019-05-26 DIAGNOSIS — M542 Cervicalgia: Secondary | ICD-10-CM | POA: Insufficient documentation

## 2019-05-26 DIAGNOSIS — M545 Low back pain, unspecified: Secondary | ICD-10-CM
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
## 2019-06-02 ENCOUNTER — Encounter: Payer: Self-pay | Admitting: Physical Therapy

## 2019-06-02 ENCOUNTER — Other Ambulatory Visit: Payer: Self-pay

## 2019-06-02 ENCOUNTER — Ambulatory Visit: Payer: Medicare Other | Attending: Internal Medicine | Admitting: Physical Therapy

## 2019-06-02 DIAGNOSIS — M545 Low back pain: Secondary | ICD-10-CM | POA: Diagnosis present

## 2019-06-02 DIAGNOSIS — M25611 Stiffness of right shoulder, not elsewhere classified: Secondary | ICD-10-CM

## 2019-06-02 DIAGNOSIS — M542 Cervicalgia: Secondary | ICD-10-CM | POA: Insufficient documentation

## 2019-06-02 DIAGNOSIS — M25511 Pain in right shoulder: Secondary | ICD-10-CM | POA: Diagnosis present

## 2019-06-02 DIAGNOSIS — M62830 Muscle spasm of back: Secondary | ICD-10-CM | POA: Diagnosis present

## 2019-06-02 DIAGNOSIS — G8929 Other chronic pain: Secondary | ICD-10-CM | POA: Insufficient documentation

## 2019-06-02 DIAGNOSIS — R293 Abnormal posture: Secondary | ICD-10-CM | POA: Diagnosis present

## 2019-06-02 NOTE — Therapy (Signed)
[place], [place], [address] Phone: [phone]   Fax:  [phone]  Physical Therapy Treatment  Patient Details  Name: Kayla Davies MRN: [id number] Date of Birth: 04/04/1955 Referring Provider (PT): Kevan Ny   Encounter Date: 06/02/2019  PT End of Session - 06/02/19 1737    Visit Number  2    Number of Visits  --    Date for PT Re-Evaluation  06/27/19    Authorization Type  Medicare    PT Start Time  3299    PT Stop Time  1746    PT Time Calculation (min)  56 min    Activity Tolerance  Patient limited by pain    Behavior During Therapy  [facility] for tasks assessed/performed       Past Medical History:  Diagnosis Date  . Allergic rhinitis, cause unspecified   . Alzheimer's dementia ([place])   . Arthropathy, unspecified, other specified sites   . Asymptomatic varicose veins   . Bladder incontinence   . Bowel incontinence   . Bronchitis   . Cervicalgia   . Edema   . Esophageal reflux   . Lumbago   . Obstructive sleep apnea (adult) (pediatric)   . Other malaise and fatigue   . Pain in joint, shoulder region   . Radiculopathy of lumbar region   . Reactive airway disease   . Scoliosis (and kyphoscoliosis), idiopathic   . 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 Unspecified asthma(493.90)   . Unspecified disorder of lipoid metabolism   . Unspecified vitamin D deficiency   . Vertigo     Past Surgical History:  Procedure Laterality Date  . NECK SURGERY      There were no vitals filed for this visit.  Subjective Assessment - 06/02/19 1704    Subjective  Pt reports she is very tired today; pt reports neck and back pain today    Patient is accompained by:  Family member;Interpreter    Currently in Pain?  Yes    Pain Score  7     Pain Location  Shoulder    Pain Orientation  Right                       [facility] Adult PT Treatment/Exercise - 06/02/19 0001      Exercises   Exercises   Shoulder;Neck;Lumbar      Neck Exercises: Machines for Strengthening   UBE (Upper Arm Bike)  L1 x 7 minutes    Lat Pull  5# 1x10 with assist for pull down d/t pain      Neck Exercises: Theraband   Scapula Retraction  15 reps   yellow TB     Neck Exercises: Standing   Other Standing Exercises  lifting ball overhead x10      Shoulder Exercises: Standing   Flexion  AROM;Strengthening;Both;10 reps    Shoulder Flexion Weight (lbs)  1# flexion with counter shelf taps    ABduction  Strengthening;AROM;Both;10 reps;Weights    Shoulder ABduction Weight (lbs)  1#    Extension  AROM;Strengthening;Both;10 reps    Extension Weight (lbs)  5#    Other Standing Exercises  bicep curls 1# x15      Shoulder Exercises: ROM/Strengthening   Ball on Wall  5x2 up/down      Modalities   Modalities  Moist Heat      Moist Heat Therapy   Number Minutes Moist Heat  12  Minutes    Moist Heat Location  Cervical;Shoulder;Lumbar Spine      Neck Exercises: Stretches   Upper Trapezius Stretch  Right;Left;1 rep;30 seconds    Levator Stretch  Right;Left;1 rep;30 seconds               PT Short Term Goals - 05/26/19 1609      PT SHORT TERM GOAL #1   Title  pt to be I with inital HEP    Time  3    Period  Weeks        PT Long Term Goals - 05/26/19 1609      PT LONG TERM GOAL #1   Title  pt to increase R shoulder AROM to 100 degrees for better ADL's    Time  8    Period  Weeks    Status  New      PT LONG TERM GOAL #2   Title  increase R shoulder strength to 3/5 without pain > 4/10    Time  8    Period  Weeks    Status  New      PT LONG TERM GOAL #3   Title  increase lumbar ROM 25%    Time  8    Period  Weeks    Status  New      PT LONG TERM GOAL #4   Title  may return to walking for exercise or her gym at her apartment    Time  8    Period  Weeks    Status  New            Plan - 06/02/19 1739    Clinical Impression Statement  Pt was limited by pain when progressing to  TE. Pt was able to tolerate low load shoulder/neck ex's with frequent rest breaks required. Pt reports that MHP helps her pain. Continue to progress shoulder/cervical ROM/strength ex's.    Comorbidities  back pain, neck pain, right knee pain, shoulder pain    PT Treatment/Interventions  ADLs/Self Care Home Management;Cryotherapy;Electrical Stimulation;Moist Heat;Traction;Therapeutic exercise;Patient/family education;Manual techniques;Dry needling;Ultrasound;Gait training;Iontophoresis 4mg /ml Dexamethasone    PT Next Visit Plan  start PT, try to get her moving some, she does report some pain relief with the heat    Consulted and Agree with Plan of Care  Patient       Patient will benefit from skilled therapeutic intervention in order to improve the following deficits and impairments:  Pain, Decreased mobility, Postural dysfunction, Decreased activity tolerance, Decreased range of motion, Decreased strength, Impaired flexibility  Visit Diagnosis: Cervicalgia  Abnormal posture  Chronic midline low back pain, unspecified whether sciatica present  Stiffness of right shoulder, not elsewhere classified  Muscle spasm of back  Chronic right shoulder pain     Problem List Patient Active Problem List   Diagnosis Date Noted  . MCI (mild cognitive impairment) 10/23/2016  . Chronic cough 10/19/2016  . Moderate single current episode of major depressive disorder (HCC) 12/20/2015  . Other seasonal allergic rhinitis 03/31/2015  . 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 Muscle tension headache 10/06/2014  . Depression (emotion) 10/06/2014  . Dementia arising in the senium and presenium (HCC) 10/06/2014  . Hypersomnia with sleep apnea 10/06/2014  . Neuropathy 07/08/2012  . Back pain 07/08/2012  . HYPERCHOLESTEROLEMIA 04/06/2010  . OVERWEIGHT 04/05/2010  . SKIN RASH 03/08/2010  . VERTIGO 05/06/2009  . CONJUNCTIVITIS, ALLERGIC 02/03/2009  . DOE (dyspnea on exertion) 11/03/2008  .  SLEEP APNEA, OBSTRUCTIVE,  MILD 07/28/2008  . VITAMIN D DEFICIENCY 06/25/2008  . FATIGUE 06/23/2008  . EDEMA 06/23/2008  . ALLERGIC RHINITIS 05/10/2008  . SHOULDER PAIN, RIGHT 04/22/2008  . NECK PAIN 10/27/2007  . REACTIVE AIRWAY DISEASE 10/01/2007  . PELVIC  PAIN 10/01/2007  . 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 POSTMENOPAUSAL STATUS 08/18/2007  . SCOLIOSIS, LUMBAR SPINE 08/18/2007  . VARICOSE VEINS, LOWER EXTREMITIES 07/28/2007  . GERD 07/28/2007  . 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 LUMBAGO 07/28/2007  . POSITIVE PPD 05/19/2007   Lysle Rubens, PT, DPT Maryanna Shape Amoy Steeves 06/02/2019, 5:42 PM  [facility] [place] [facility] [address] [place], [place], [address] Phone: [phone]   Fax:  [phone]  Name: Kayla Davies MRN: [id number] Date of Birth: 1955/02/21

## 2019-06-03 ENCOUNTER — Encounter: Payer: Self-pay | Admitting: Physical Therapy

## 2019-06-03 ENCOUNTER — Ambulatory Visit: Payer: Medicare Other | Admitting: Physical Therapy

## 2019-06-03 DIAGNOSIS — M62830 Muscle spasm of back: Secondary | ICD-10-CM

## 2019-06-03 DIAGNOSIS — M542 Cervicalgia: Secondary | ICD-10-CM | POA: Diagnosis not present

## 2019-06-03 DIAGNOSIS — M25611 Stiffness of right shoulder, not elsewhere classified: Secondary | ICD-10-CM

## 2019-06-03 DIAGNOSIS — R293 Abnormal posture: Secondary | ICD-10-CM

## 2019-06-03 DIAGNOSIS — M545 Low back pain, unspecified: Secondary | ICD-10-CM

## 2019-06-03 DIAGNOSIS — G8929 Other chronic pain: Secondary | ICD-10-CM

## 2019-06-03 DIAGNOSIS — M25511 Pain in right shoulder: Secondary | ICD-10-CM

## 2019-06-03 NOTE — Therapy (Signed)
[place], [place], [address] Phone: [phone]   Fax:  [phone]  Physical Therapy Treatment  Patient Details  Name: Kayla Davies MRN: [id number] Date of Birth: January 25, 1956 Referring Provider (PT): Kevan Ny   Encounter Date: 06/03/2019  PT End of Session - 06/03/19 1636    Visit Number  3    Date for PT Re-Evaluation  06/27/19    Authorization Type  Medicare    PT Start Time  1528   heat   PT Stop Time  1628    PT Time Calculation (min)  60 min    Activity Tolerance  Patient limited by pain    Behavior During Therapy  [facility] for tasks assessed/performed       Past Medical History:  Diagnosis Date  . Allergic rhinitis, cause unspecified   . Alzheimer's dementia ([place])   . Arthropathy, unspecified, other specified sites   . Asymptomatic varicose veins   . Bladder incontinence   . Bowel incontinence   . Bronchitis   . Cervicalgia   . Edema   . Esophageal reflux   . Lumbago   . Obstructive sleep apnea (adult) (pediatric)   . Other malaise and fatigue   . Pain in joint, shoulder region   . Radiculopathy of lumbar region   . Reactive airway disease   . Scoliosis (and kyphoscoliosis), idiopathic   . 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 Unspecified asthma(493.90)   . Unspecified disorder of lipoid metabolism   . Unspecified vitamin D deficiency   . Vertigo     Past Surgical History:  Procedure Laterality Date  . NECK SURGERY      There were no vitals filed for this visit.  Subjective Assessment - 06/03/19 1531    Subjective  Patient reports "pain" neck back shoulders, reports sore after the last treatment    Currently in Pain?  Yes    Pain Score  8     Pain Location  Shoulder    Pain Orientation  Right    Aggravating Factors   activity                       [facility] Adult PT Treatment/Exercise - 06/03/19 0001      Neck Exercises: Machines for Strengthening   UBE  (Upper Arm Bike)  L1 x 5 minutes    Nustep  level 3 x 5 minutes    Lat Pull  5# 1x10 with assist for pull down d/t pain      Neck Exercises: Theraband   Scapula Retraction  15 reps      Neck Exercises: Standing   Other Standing Exercises  lifting ball overhead x10      Lumbar Exercises: Seated   Other Seated Lumbar Exercises  toe taps, heel raises, ball hip to hip      Shoulder Exercises: Standing   Flexion  AROM;Strengthening;Both;10 reps    Shoulder Flexion Weight (lbs)  1# flexion with counter shelf taps    ABduction  Strengthening;AROM;Both;10 reps;Weights    Shoulder ABduction Weight (lbs)  1#    Extension  AROM;Strengthening;Both;10 reps    Extension Weight (lbs)  5#    Other Standing Exercises  bicep curls 1# x15      Shoulder Exercises: ROM/Strengthening   Ball on Wall  5x2 up/down      Modalities   Modalities  Moist Heat      Moist Heat  Therapy   Number Minutes Moist Heat  15 Minutes    Moist Heat Location  Cervical;Shoulder;Lumbar Spine      Neck Exercises: Stretches   Upper Trapezius Stretch  Right;Left;1 rep;30 seconds    Levator Stretch  Right;Left;1 rep;30 seconds               PT Short Term Goals - 05/26/19 1609      PT SHORT TERM GOAL #1   Title  pt to be I with inital HEP    Time  3    Period  Weeks        PT Long Term Goals - 06/03/19 1640      PT LONG TERM GOAL #1   Title  pt to increase R shoulder AROM to 100 degrees for better ADL's    Status  On-going      PT LONG TERM GOAL #2   Title  increase R shoulder strength to 3/5 without pain > 4/10    Status  On-going            Plan - 06/03/19 1637    Clinical Impression Statement  Patient c/o pain and is very weak and slow but she was able to do all of the exercises with some assist and cues needed especially for posture.She reports that she gets great relief from the heat.    PT Next Visit Plan  continue to work on motions and strength    Consulted and Agree with Plan of  Care  Patient       Patient will benefit from skilled therapeutic intervention in order to improve the following deficits and impairments:  Pain, Decreased mobility, Postural dysfunction, Decreased activity tolerance, Decreased range of motion, Decreased strength, Impaired flexibility  Visit Diagnosis: Cervicalgia  Abnormal posture  Chronic midline low back pain, unspecified whether sciatica present  Stiffness of right shoulder, not elsewhere classified  Muscle spasm of back  Chronic right shoulder pain     Problem List Patient Active Problem List   Diagnosis Date Noted  . MCI (mild cognitive impairment) 10/23/2016  . Chronic cough 10/19/2016  . Moderate single current episode of major depressive disorder (HCC) 12/20/2015  . Other seasonal allergic rhinitis 03/31/2015  . 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 Muscle tension headache 10/06/2014  . Depression (emotion) 10/06/2014  . Dementia arising in the senium and presenium (HCC) 10/06/2014  . Hypersomnia with sleep apnea 10/06/2014  . Neuropathy 07/08/2012  . Back pain 07/08/2012  . HYPERCHOLESTEROLEMIA 04/06/2010  . OVERWEIGHT 04/05/2010  . SKIN RASH 03/08/2010  . VERTIGO 05/06/2009  . CONJUNCTIVITIS, ALLERGIC 02/03/2009  . DOE (dyspnea on exertion) 11/03/2008  . SLEEP APNEA, OBSTRUCTIVE, MILD 07/28/2008  . VITAMIN D DEFICIENCY 06/25/2008  . FATIGUE 06/23/2008  . EDEMA 06/23/2008  . ALLERGIC RHINITIS 05/10/2008  . SHOULDER PAIN, RIGHT 04/22/2008  . NECK PAIN 10/27/2007  . REACTIVE AIRWAY DISEASE 10/01/2007  . PELVIC  PAIN 10/01/2007  . 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 POSTMENOPAUSAL STATUS 08/18/2007  . SCOLIOSIS, LUMBAR SPINE 08/18/2007  . VARICOSE VEINS, LOWER EXTREMITIES 07/28/2007  . GERD 07/28/2007  . 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 LUMBAGO 07/28/2007  . POSITIVE PPD 05/19/2007    Jearld Lesch., PT 06/03/2019, 4:41 PM  [facility] [place] [facility] [address] [place], [place], [address] Phone: [phone]   Fax:  [phone]  Name: Kayla Davies MRN: [id number] Date of Birth: Nov 18, 1955

## 2019-06-08 ENCOUNTER — Ambulatory Visit: Payer: Medicare Other | Admitting: Physical Therapy

## 2019-06-08 ENCOUNTER — Encounter: Payer: Self-pay | Admitting: Physical Therapy

## 2019-06-08 ENCOUNTER — Other Ambulatory Visit: Payer: Self-pay

## 2019-06-08 DIAGNOSIS — R293 Abnormal posture: Secondary | ICD-10-CM

## 2019-06-08 DIAGNOSIS — G8929 Other chronic pain: Secondary | ICD-10-CM

## 2019-06-08 DIAGNOSIS — M62830 Muscle spasm of back: Secondary | ICD-10-CM

## 2019-06-08 DIAGNOSIS — M25611 Stiffness of right shoulder, not elsewhere classified: Secondary | ICD-10-CM

## 2019-06-08 DIAGNOSIS — M545 Low back pain, unspecified: Secondary | ICD-10-CM

## 2019-06-08 DIAGNOSIS — M542 Cervicalgia: Secondary | ICD-10-CM

## 2019-06-08 NOTE — Therapy (Signed)
[place], [place], [address] Phone: [phone]   Fax:  [phone]  Physical Therapy Treatment  Patient Details  Name: Kayla Davies MRN: [id number] Date of Birth: 1955-04-07 Referring Provider (PT): Kevan Ny   Encounter Date: 06/08/2019  PT End of Session - 06/08/19 1006    Visit Number  4    Number of Visits  4    Date for PT Re-Evaluation  06/27/19    Authorization Type  Medicare    PT Start Time  0928    PT Stop Time  1017    PT Time Calculation (min)  49 min    Activity Tolerance  Patient limited by pain    Behavior During Therapy  [facility] for tasks assessed/performed       Past Medical History:  Diagnosis Date  . Allergic rhinitis, cause unspecified   . Alzheimer's dementia ([place])   . Arthropathy, unspecified, other specified sites   . Asymptomatic varicose veins   . Bladder incontinence   . Bowel incontinence   . Bronchitis   . Cervicalgia   . Edema   . Esophageal reflux   . Lumbago   . Obstructive sleep apnea (adult) (pediatric)   . Other malaise and fatigue   . Pain in joint, shoulder region   . Radiculopathy of lumbar region   . Reactive airway disease   . Scoliosis (and kyphoscoliosis), idiopathic   . 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 Unspecified asthma(493.90)   . Unspecified disorder of lipoid metabolism   . Unspecified vitamin D deficiency   . Vertigo     Past Surgical History:  Procedure Laterality Date  . NECK SURGERY      There were no vitals filed for this visit.  Subjective Assessment - 06/08/19 0929    Subjective  Pt reports that she was sore following last rx; felt a little better for a day or two then the radiating pain came back    Patient is accompained by:  Family member;Interpreter    Currently in Pain?  Yes    Pain Score  6     Pain Location  Shoulder    Pain Orientation  Right    Pain Type  Chronic pain                        [facility] Adult PT Treatment/Exercise - 06/08/19 0001      Neck Exercises: Machines for Strengthening   UBE (Upper Arm Bike)  L2 x 3 min fwd/3 min bkwd    Lat Pull  5# 1x10 with assist for pull down d/t pain      Neck Exercises: Theraband   Scapula Retraction  15 reps   yellow TB     Shoulder Exercises: Standing   Flexion  AROM;Strengthening;Both;10 reps    Shoulder Flexion Weight (lbs)  1# flexion with counter shelf taps    Flexion Limitations  limited by pain today    ABduction  Strengthening;AROM;Both;10 reps;Weights    Shoulder ABduction Weight (lbs)  1#    Other Standing Exercises  wall ladder abduction 2x5 on R      Shoulder Exercises: ROM/Strengthening   Ball on Wall  5x2 up/down      Moist Heat Therapy   Number Minutes Moist Heat  15 Minutes    Moist Heat Location  Cervical;Shoulder;Lumbar Spine  PT Short Term Goals - 05/26/19 1609      PT SHORT TERM GOAL #1   Title  pt to be I with inital HEP    Time  3    Period  Weeks        PT Long Term Goals - 06/03/19 1640      PT LONG TERM GOAL #1   Title  pt to increase R shoulder AROM to 100 degrees for better ADL's    Status  On-going      PT LONG TERM GOAL #2   Title  increase R shoulder strength to 3/5 without pain > 4/10    Status  On-going            Plan - 06/08/19 1007    Clinical Impression Statement  Pt limited in progression of TE by pain but able to complete all exercises; needs demonstration and tactile cues for correct form. Pt reports relief from heat.    PT Treatment/Interventions  ADLs/Self Care Home Management;Cryotherapy;Electrical Stimulation;Moist Heat;Traction;Therapeutic exercise;Patient/family education;Manual techniques;Dry needling;Ultrasound;Gait training;Iontophoresis 4mg /ml Dexamethasone    PT Next Visit Plan  continue to work on motions and strength    Consulted and Agree with Plan of Care  Patient       Patient will  benefit from skilled therapeutic intervention in order to improve the following deficits and impairments:  Pain, Decreased mobility, Postural dysfunction, Decreased activity tolerance, Decreased range of motion, Decreased strength, Impaired flexibility  Visit Diagnosis: Cervicalgia  Abnormal posture  Chronic midline low back pain, unspecified whether sciatica present  Stiffness of right shoulder, not elsewhere classified  Muscle spasm of back  Chronic right shoulder pain     Problem List Patient Active Problem List   Diagnosis Date Noted  . MCI (mild cognitive impairment) 10/23/2016  . Chronic cough 10/19/2016  . Moderate single current episode of major depressive disorder (HCC) 12/20/2015  . Other seasonal allergic rhinitis 03/31/2015  . 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 Muscle tension headache 10/06/2014  . Depression (emotion) 10/06/2014  . Dementia arising in the senium and presenium (HCC) 10/06/2014  . Hypersomnia with sleep apnea 10/06/2014  . Neuropathy 07/08/2012  . Back pain 07/08/2012  . HYPERCHOLESTEROLEMIA 04/06/2010  . OVERWEIGHT 04/05/2010  . SKIN RASH 03/08/2010  . VERTIGO 05/06/2009  . CONJUNCTIVITIS, ALLERGIC 02/03/2009  . DOE (dyspnea on exertion) 11/03/2008  . SLEEP APNEA, OBSTRUCTIVE, MILD 07/28/2008  . VITAMIN D DEFICIENCY 06/25/2008  . FATIGUE 06/23/2008  . EDEMA 06/23/2008  . ALLERGIC RHINITIS 05/10/2008  . SHOULDER PAIN, RIGHT 04/22/2008  . NECK PAIN 10/27/2007  . REACTIVE AIRWAY DISEASE 10/01/2007  . PELVIC  PAIN 10/01/2007  . 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 POSTMENOPAUSAL STATUS 08/18/2007  . SCOLIOSIS, LUMBAR SPINE 08/18/2007  . VARICOSE VEINS, LOWER EXTREMITIES 07/28/2007  . GERD 07/28/2007  . 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 LUMBAGO 07/28/2007  . POSITIVE PPD 05/19/2007   05/21/2007, PT, DPT Lysle Rubens Caeson Filippi 06/08/2019, 10:09 AM  [facility]  [place] [address] [place] [address] [place], [place], [place] Phone: [phone]   Fax:  [phone]  Name: LYNLEY KILLILEA MRN: Merrilee Seashore Date of Birth: 01/08/1956

## 2019-06-10 ENCOUNTER — Ambulatory Visit: Payer: Medicare Other | Admitting: Physical Therapy

## 2019-06-12 ENCOUNTER — Other Ambulatory Visit: Payer: Self-pay

## 2019-06-12 ENCOUNTER — Encounter: Payer: Self-pay | Admitting: Physical Therapy

## 2019-06-12 ENCOUNTER — Ambulatory Visit: Payer: Medicare Other | Admitting: Physical Therapy

## 2019-06-12 DIAGNOSIS — M542 Cervicalgia: Secondary | ICD-10-CM

## 2019-06-12 DIAGNOSIS — R293 Abnormal posture: Secondary | ICD-10-CM

## 2019-06-12 DIAGNOSIS — G8929 Other chronic pain: Secondary | ICD-10-CM

## 2019-06-12 DIAGNOSIS — M25611 Stiffness of right shoulder, not elsewhere classified: Secondary | ICD-10-CM

## 2019-06-12 DIAGNOSIS — M545 Low back pain, unspecified: Secondary | ICD-10-CM

## 2019-06-12 NOTE — Therapy (Signed)
[facility] [place] [address] [place], [place], [address] Phone: [phone]   Fax:  [phone]  Physical Therapy Treatment  Patient Details  Name: DOREATHER HOXWORTH MRN: [id number] Date of Birth: [birth year] Referring Provider (PT): Willey Blade   Encounter Date: 06/12/2019  PT End of Session - 06/12/19 0841    Visit Number  5    Date for PT Re-Evaluation  06/27/19    Authorization Type  Medicare    PT Start Time  0800    PT Stop Time  0852    PT Time Calculation (min)  52 min    Activity Tolerance  Patient tolerated treatment well    Behavior During Therapy  [facility] for tasks assessed/performed       Past Medical History:  Diagnosis Date  . Allergic rhinitis, cause unspecified   . Alzheimer's dementia (HCC)   . Arthropathy, unspecified, other specified sites   . Asymptomatic varicose veins   . Bladder incontinence   . Bowel incontinence   . Bronchitis   . Cervicalgia   . Edema   . Esophageal reflux   . Lumbago   . Obstructive sleep apnea (adult) (pediatric)   . Other malaise and fatigue   . Pain in joint, shoulder region   . Radiculopathy of lumbar region   . Reactive airway disease   . Scoliosis (and kyphoscoliosis), idiopathic   . 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 Unspecified asthma(493.90)   . Unspecified disorder of lipoid metabolism   . Unspecified vitamin D deficiency   . Vertigo     Past Surgical History:  Procedure Laterality Date  . NECK SURGERY      There were no vitals filed for this visit.  Subjective Assessment - 06/12/19 0759    Subjective  Pt reports that she is feeling a little better this morning but still sore.    Patient is accompained by:  Interpreter    Currently in Pain?  Yes    Pain Score  7     Pain Location  Shoulder    Pain Orientation  Right                        [facility] Adult PT Treatment/Exercise - 06/12/19 0001      Neck Exercises: Machines for Strengthening   UBE (Upper Arm Bike)  L2 x 3 min fwd/3 min bkwd    Lat Pull  10# 1x10 independently    Other Machines for Strengthening  tricep pull down 10# 1x10      Neck Exercises: Theraband   Scapula Retraction  10 reps;Green      Neck Exercises: Standing   Other Standing Exercises  wall ladder abduction on R x5      Neck Exercises: Seated   Shoulder ABduction  Both;10 reps;Weights    Shoulder Abduction Weights (lbs)  1      Shoulder Exercises: ROM/Strengthening   Ball on Wall  5x2 up/down    Other ROM/Strengthening Exercises  ball overhead with back to wall 2x5      Shoulder Exercises: Stretch   Cross Chest Stretch  1 rep;30 seconds    Other Shoulder Stretches  wrist flexion/extension stretch x30 sec each direction      Moist Heat Therapy   Number Minutes Moist Heat  15 Minutes    Moist Heat Location  Cervical;Shoulder;Lumbar Spine  PT Short Term Goals - 05/26/19 1609      PT SHORT TERM GOAL #1   Title  pt to be I with inital HEP    Time  3    Period  Weeks        PT Long Term Goals - 06/03/19 1640      PT LONG TERM GOAL #1   Title  pt to increase R shoulder AROM to 100 degrees for better ADL's    Status  On-going      PT LONG TERM GOAL #2   Title  increase R shoulder strength to 3/5 without pain > 4/10    Status  On-going            Plan - 06/12/19 0844    Clinical Impression Statement  Pt ROM/pain seems to be easing some; able to progress TE today with fewer limitations in exercise d/t pain. Pt reports relief from heat. Continue to progress next rx.    PT Treatment/Interventions  ADLs/Self Care Home Management;Cryotherapy;Electrical Stimulation;Moist Heat;Traction;Therapeutic exercise;Patient/family education;Manual techniques;Dry needling;Ultrasound;Gait training;Iontophoresis 4mg /ml Dexamethasone    PT Next Visit Plan  continue to work on motions and strength    Consulted and Agree with Plan of Care  Patient       Patient will benefit from  skilled therapeutic intervention in order to improve the following deficits and impairments:  Pain, Decreased mobility, Postural dysfunction, Decreased activity tolerance, Decreased range of motion, Decreased strength, Impaired flexibility  Visit Diagnosis: Cervicalgia  Abnormal posture  Chronic midline low back pain, unspecified whether sciatica present  Stiffness of right shoulder, not elsewhere classified  Chronic right shoulder pain     Problem List Patient Active Problem List   Diagnosis Date Noted  . MCI (mild cognitive impairment) 10/23/2016  . Chronic cough 10/19/2016  . Moderate single current episode of major depressive disorder ([place]) 12/20/2015  . Other seasonal allergic rhinitis 03/31/2015  . Depression due to dementia ([place]) 03/31/2015  . Muscle tension headache 10/06/2014  . Depression (emotion) 10/06/2014  . Dementia arising in the senium and presenium ([place]) 10/06/2014  . Hypersomnia with sleep apnea 10/06/2014  . Neuropathy 07/08/2012  . Back pain 07/08/2012  . HYPERCHOLESTEROLEMIA 04/06/2010  . OVERWEIGHT 04/05/2010  . SKIN RASH 03/08/2010  . VERTIGO 05/06/2009  . CONJUNCTIVITIS, ALLERGIC 02/03/2009  . DOE (dyspnea on exertion) 11/03/2008  . SLEEP APNEA, OBSTRUCTIVE, MILD 07/28/2008  . VITAMIN D DEFICIENCY 06/25/2008  . FATIGUE 06/23/2008  . EDEMA 06/23/2008  . ALLERGIC RHINITIS 05/10/2008  . SHOULDER PAIN, RIGHT 04/22/2008  . NECK PAIN 10/27/2007  . REACTIVE AIRWAY DISEASE 10/01/2007  . PELVIC  PAIN 10/01/2007  . 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 POSTMENOPAUSAL STATUS 08/18/2007  . SCOLIOSIS, LUMBAR SPINE 08/18/2007  . VARICOSE VEINS, LOWER EXTREMITIES 07/28/2007  . GERD 07/28/2007  . 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 LUMBAGO 07/28/2007  . POSITIVE PPD 05/19/2007   Amador Cunas, PT, DPT Donald Prose Bond Grieshop 06/12/2019, 8:50 AM  [place]  [place], [place], [address] Phone: [phone]   Fax:  [phone]  Name: MARLISS BUTTACAVOLI MRN: [id number] Date of Birth: 1955-03-06

## 2019-06-16 ENCOUNTER — Other Ambulatory Visit: Payer: Self-pay

## 2019-06-16 ENCOUNTER — Ambulatory Visit: Payer: Medicare Other | Admitting: Physical Therapy

## 2019-06-16 ENCOUNTER — Encounter: Payer: Self-pay | Admitting: Physical Therapy

## 2019-06-16 DIAGNOSIS — M542 Cervicalgia: Secondary | ICD-10-CM | POA: Diagnosis not present

## 2019-06-16 DIAGNOSIS — M62830 Muscle spasm of back: Secondary | ICD-10-CM

## 2019-06-16 DIAGNOSIS — M545 Low back pain, unspecified: Secondary | ICD-10-CM

## 2019-06-16 DIAGNOSIS — M25611 Stiffness of right shoulder, not elsewhere classified: Secondary | ICD-10-CM

## 2019-06-16 DIAGNOSIS — G8929 Other chronic pain: Secondary | ICD-10-CM

## 2019-06-16 DIAGNOSIS — R293 Abnormal posture: Secondary | ICD-10-CM

## 2019-06-16 NOTE — Therapy (Signed)
[facility] [place] [address] [place], [place], [address] Phone: [phone]   Fax:  [phone]  Physical Therapy Treatment  Patient Details  Name: Kayla Davies MRN: [id number] Date of Birth: 29-Jan-1956 Referring Provider (PT): Willey Blade   Encounter Date: 06/16/2019  PT End of Session - 06/16/19 0850    Visit Number  6    Date for PT Re-Evaluation  06/27/19    Authorization Type  Medicare    PT Start Time  0800    PT Stop Time  0855    PT Time Calculation (min)  55 min    Activity Tolerance  Patient limited by pain    Behavior During Therapy  [facility] for tasks assessed/performed       Past Medical History:  Diagnosis Date  . Allergic rhinitis, cause unspecified   . Alzheimer's dementia (HCC)   . Arthropathy, unspecified, other specified sites   . Asymptomatic varicose veins   . Bladder incontinence   . Bowel incontinence   . Bronchitis   . Cervicalgia   . Edema   . Esophageal reflux   . Lumbago   . Obstructive sleep apnea (adult) (pediatric)   . Other malaise and fatigue   . Pain in joint, shoulder region   . Radiculopathy of lumbar region   . Reactive airway disease   . Scoliosis (and kyphoscoliosis), idiopathic   . 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 Unspecified asthma(493.90)   . Unspecified disorder of lipoid metabolism   . Unspecified vitamin D deficiency   . Vertigo     Past Surgical History:  Procedure Laterality Date  . NECK SURGERY      There were no vitals filed for this visit.  Subjective Assessment - 06/16/19 0803    Subjective  Pt reports her shoulder and LB were very painful and sore this past weekend.    Patient is accompained by:  Family member    Currently in Pain?  Yes    Pain Score  8     Pain Location  Shoulder    Pain Orientation  Right    Pain Score  7    Pain Location  Back    Pain Orientation  Lower                        [facility] Adult PT Treatment/Exercise -  06/16/19 0001      Neck Exercises: Machines for Strengthening   Nustep  level 5 x 6 min      Lumbar Exercises: Standing   Wall Slides  20 reps    Wall Slides Limitations  flexion and abduction x2      Lumbar Exercises: Seated   Other Seated Lumbar Exercises  seated forward flexion with exercise ball fwd and lat x3 each direction      Moist Heat Therapy   Number Minutes Moist Heat  15 Minutes    Moist Heat Location  Cervical;Shoulder;Lumbar Spine      Manual Therapy   Manual Therapy  Soft tissue mobilization    Soft tissue mobilization  STM to R cervical paraspinals, UT, tricep, bicep with UT stretch               PT Short Term Goals - 05/26/19 1609      PT SHORT TERM GOAL #1   Title  pt to be I with inital HEP    Time  3    Period  Weeks        PT Long Term Goals - 06/03/19 1640      PT LONG TERM GOAL #1   Title  pt to increase R shoulder AROM to 100 degrees for better ADL's    Status  On-going      PT LONG TERM GOAL #2   Title  increase R shoulder strength to 3/5 without pain > 4/10    Status  On-going            Plan - 06/16/19 0850    Clinical Impression Statement  Pt unable to perform normal ex's today d/t complaints of R UE pain; pt very limited by pain today. Spurling's positive on R side; consider potential for cervical traction next rx. Pt responded well to heat and STM.    PT Treatment/Interventions  ADLs/Self Care Home Management;Cryotherapy;Electrical Stimulation;Moist Heat;Traction;Therapeutic exercise;Patient/family education;Manual techniques;Dry needling;Ultrasound;Gait training;Iontophoresis 4mg /ml Dexamethasone    PT Next Visit Plan  continue to work on motions and strength    Consulted and Agree with Plan of Care  Patient       Patient will benefit from skilled therapeutic intervention in order to improve the following deficits and impairments:  Pain, Decreased mobility, Postural dysfunction, Decreased activity tolerance, Decreased  range of motion, Decreased strength, Impaired flexibility  Visit Diagnosis: Cervicalgia  Abnormal posture  Chronic midline low back pain, unspecified whether sciatica present  Stiffness of right shoulder, not elsewhere classified  Chronic right shoulder pain  Muscle spasm of back     Problem List Patient Active Problem List   Diagnosis Date Noted  . MCI (mild cognitive impairment) 10/23/2016  . Chronic cough 10/19/2016  . Moderate single current episode of major depressive disorder ([place]) 12/20/2015  . Other seasonal allergic rhinitis 03/31/2015  . 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 Muscle tension headache 10/06/2014  . Depression (emotion) 10/06/2014  . Dementia arising in the senium and presenium ([place]) 10/06/2014  . Hypersomnia with sleep apnea 10/06/2014  . Neuropathy 07/08/2012  . Back pain 07/08/2012  . HYPERCHOLESTEROLEMIA 04/06/2010  . OVERWEIGHT 04/05/2010  . SKIN RASH 03/08/2010  . VERTIGO 05/06/2009  . CONJUNCTIVITIS, ALLERGIC 02/03/2009  . DOE (dyspnea on exertion) 11/03/2008  . SLEEP APNEA, OBSTRUCTIVE, MILD 07/28/2008  . VITAMIN D DEFICIENCY 06/25/2008  . FATIGUE 06/23/2008  . EDEMA 06/23/2008  . ALLERGIC RHINITIS 05/10/2008  . SHOULDER PAIN, RIGHT 04/22/2008  . NECK PAIN 10/27/2007  . REACTIVE AIRWAY DISEASE 10/01/2007  . PELVIC  PAIN 10/01/2007  . 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 POSTMENOPAUSAL STATUS 08/18/2007  . SCOLIOSIS, LUMBAR SPINE 08/18/2007  . VARICOSE VEINS, LOWER EXTREMITIES 07/28/2007  . GERD 07/28/2007  . 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 LUMBAGO 07/28/2007  . POSITIVE PPD 05/19/2007   Amador Cunas, PT, DPT Donald Prose Mayme Profeta 06/16/2019, 8:54 AM  [place], [place], [address] Phone: [phone]   Fax:  [phone]  Name: Kayla Davies MRN: [id number] Date of Birth: 1955/09/23

## 2019-06-18 ENCOUNTER — Ambulatory Visit: Payer: Medicare Other | Admitting: Physical Therapy

## 2019-06-18 ENCOUNTER — Other Ambulatory Visit: Payer: Medicare Other

## 2019-06-18 ENCOUNTER — Encounter: Payer: Self-pay | Admitting: Physical Therapy

## 2019-06-18 ENCOUNTER — Other Ambulatory Visit: Payer: Self-pay

## 2019-06-18 DIAGNOSIS — M62830 Muscle spasm of back: Secondary | ICD-10-CM

## 2019-06-18 DIAGNOSIS — M542 Cervicalgia: Secondary | ICD-10-CM | POA: Diagnosis not present

## 2019-06-18 DIAGNOSIS — M545 Low back pain, unspecified: Secondary | ICD-10-CM

## 2019-06-18 DIAGNOSIS — R293 Abnormal posture: Secondary | ICD-10-CM

## 2019-06-18 DIAGNOSIS — M25611 Stiffness of right shoulder, not elsewhere classified: Secondary | ICD-10-CM

## 2019-06-18 DIAGNOSIS — G8929 Other chronic pain: Secondary | ICD-10-CM

## 2019-06-18 NOTE — Therapy (Signed)
[facility] [place] [address] [place], [place], [address] Phone: [phone]   Fax:  [phone]  Physical Therapy Treatment  Patient Details  Name: Kayla Davies MRN: [id number] Date of Birth: 1955/09/05 Referring Provider (PT): Willey Blade   Encounter Date: 06/18/2019  PT End of Session - 06/18/19 0844    Visit Number  7    Date for PT Re-Evaluation  06/27/19    PT Start Time  0800    PT Stop Time  0850    PT Time Calculation (min)  50 min    Activity Tolerance  Patient limited by pain    Behavior During Therapy  [facility] for tasks assessed/performed       Past Medical History:  Diagnosis Date  . Allergic rhinitis, cause unspecified   . Alzheimer's dementia (HCC)   . Arthropathy, unspecified, other specified sites   . Asymptomatic varicose veins   . Bladder incontinence   . Bowel incontinence   . Bronchitis   . Cervicalgia   . Edema   . Esophageal reflux   . Lumbago   . Obstructive sleep apnea (adult) (pediatric)   . Other malaise and fatigue   . Pain in joint, shoulder region   . Radiculopathy of lumbar region   . Reactive airway disease   . Scoliosis (and kyphoscoliosis), idiopathic   . 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 Unspecified asthma(493.90)   . Unspecified disorder of lipoid metabolism   . Unspecified vitamin D deficiency   . Vertigo     Past Surgical History:  Procedure Laterality Date  . NECK SURGERY      There were no vitals filed for this visit.  Subjective Assessment - 06/18/19 0809    Subjective  Pt reports her shoulder is still very painful; states it feels a little better after using heat but doesn't feel like its getting better.    Patient is accompained by:  Interpreter    Currently in Pain?  Yes    Pain Score  8     Pain Location  Shoulder    Pain Orientation  Right                        [facility] Adult PT Treatment/Exercise - 06/18/19 0001      Neck Exercises: Machines  for Strengthening   Nustep  level 3 x 6 min      Neck Exercises: Theraband   Scapula Retraction  15 reps;Green      Lumbar Exercises: Stretches   Lower Trunk Rotation  60 seconds    Other Lumbar Stretch Exercise  open book thoracic rotations 1x5 B      Lumbar Exercises: Standing   Wall Slides Limitations  flexion/abduction 1x5      Shoulder Exercises: Standing   Other Standing Exercises  wall ladder flexion/abduction 1x5      Moist Heat Therapy   Number Minutes Moist Heat  15 Minutes    Moist Heat Location  Cervical;Shoulder;Lumbar Spine               PT Short Term Goals - 05/26/19 1609      PT SHORT TERM GOAL #1   Title  pt to be I with inital HEP    Time  3    Period  Weeks        PT Long Term Goals - 06/03/19 1640      PT LONG TERM GOAL #1  Title  pt to increase R shoulder AROM to 100 degrees for better ADL's    Status  On-going      PT LONG TERM GOAL #2   Title  increase R shoulder strength to 3/5 without pain > 4/10    Status  On-going            Plan - 06/18/19 0845    Clinical Impression Statement  Pt very limited by pain today; difficulty with AROM and AAROM and complaining of more pain in the LB during exercise. Focused on LB/thoracic movement and ROM ex's for today's session. Pt reports relief from heat.    PT Treatment/Interventions  ADLs/Self Care Home Management;Cryotherapy;Electrical Stimulation;Moist Heat;Traction;Therapeutic exercise;Patient/family education;Manual techniques;Dry needling;Ultrasound;Gait training;Iontophoresis 4mg /ml Dexamethasone    PT Next Visit Plan  continue to work on motions and strength    Consulted and Agree with Plan of Care  Patient       Patient will benefit from skilled therapeutic intervention in order to improve the following deficits and impairments:  Pain, Decreased mobility, Postural dysfunction, Decreased activity tolerance, Decreased range of motion, Decreased strength, Impaired flexibility  Visit  Diagnosis: Cervicalgia  Abnormal posture  Chronic midline low back pain, unspecified whether sciatica present  Stiffness of right shoulder, not elsewhere classified  Chronic right shoulder pain  Muscle spasm of back     Problem List Patient Active Problem List   Diagnosis Date Noted  . MCI (mild cognitive impairment) 10/23/2016  . Chronic cough 10/19/2016  . Moderate single current episode of major depressive disorder ([place]) 12/20/2015  . Other seasonal allergic rhinitis 03/31/2015  . 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 Muscle tension headache 10/06/2014  . Depression (emotion) 10/06/2014  . Dementia arising in the senium and presenium ([place]) 10/06/2014  . Hypersomnia with sleep apnea 10/06/2014  . Neuropathy 07/08/2012  . Back pain 07/08/2012  . HYPERCHOLESTEROLEMIA 04/06/2010  . OVERWEIGHT 04/05/2010  . SKIN RASH 03/08/2010  . VERTIGO 05/06/2009  . CONJUNCTIVITIS, ALLERGIC 02/03/2009  . DOE (dyspnea on exertion) 11/03/2008  . SLEEP APNEA, OBSTRUCTIVE, MILD 07/28/2008  . VITAMIN D DEFICIENCY 06/25/2008  . FATIGUE 06/23/2008  . EDEMA 06/23/2008  . ALLERGIC RHINITIS 05/10/2008  . SHOULDER PAIN, RIGHT 04/22/2008  . NECK PAIN 10/27/2007  . REACTIVE AIRWAY DISEASE 10/01/2007  . PELVIC  PAIN 10/01/2007  . 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 POSTMENOPAUSAL STATUS 08/18/2007  . SCOLIOSIS, LUMBAR SPINE 08/18/2007  . VARICOSE VEINS, LOWER EXTREMITIES 07/28/2007  . GERD 07/28/2007  . 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 LUMBAGO 07/28/2007  . POSITIVE PPD 05/19/2007   Amador Cunas, PT, DPT [name] 06/18/2019, 8:52 AM  [place], [place], [address] Phone: [phone]   Fax:  [phone]  Name: Kayla Davies MRN: [id number] Date of Birth: 1955/10/09

## 2019-06-19 ENCOUNTER — Ambulatory Visit
Admission: RE | Admit: 2019-06-19 | Discharge: 2019-06-19 | Disposition: A | Discharge status: Home / Self Care | Payer: Medicare Other | Source: Ambulatory Visit | Attending: Endocrinology | Admitting: Endocrinology

## 2019-06-19 DIAGNOSIS — M4722 Other spondylosis with radiculopathy, cervical region: Secondary | ICD-10-CM

## 2019-06-19 DIAGNOSIS — M4180 Other forms of scoliosis, site unspecified: Secondary | ICD-10-CM

## 2019-06-19 MED ORDER — GADOBENATE DIMEGLUMINE 529 MG/ML IV SOLN
13.0000 mL | Freq: Once | INTRAVENOUS | Status: AC | PRN
  Administered 2019-06-19: 13 mL via INTRAVENOUS

## 2019-06-22 ENCOUNTER — Ambulatory Visit: Payer: Medicare Other | Admitting: Physical Therapy

## 2019-06-22 ENCOUNTER — Other Ambulatory Visit: Payer: Self-pay

## 2019-06-22 DIAGNOSIS — M542 Cervicalgia: Secondary | ICD-10-CM | POA: Diagnosis not present

## 2019-06-22 DIAGNOSIS — R293 Abnormal posture: Secondary | ICD-10-CM

## 2019-06-22 DIAGNOSIS — M25611 Stiffness of right shoulder, not elsewhere classified: Secondary | ICD-10-CM

## 2019-06-22 DIAGNOSIS — G8929 Other chronic pain: Secondary | ICD-10-CM

## 2019-06-22 DIAGNOSIS — M62830 Muscle spasm of back: Secondary | ICD-10-CM

## 2019-06-22 NOTE — Therapy (Signed)
[facility] [address] [place], [place], [address] Phone: [phone]   Fax:  [phone]  Physical Therapy Treatment  Patient Details  Name: Kayla Davies MRN: [id number] Date of Birth: 04/10/1955 Referring Provider (PT): Willey Blade   Encounter Date: 06/22/2019  PT End of Session - 06/22/19 0926    Visit Number  8    Date for PT Re-Evaluation  06/27/19    Authorization Type  Medicare    PT Start Time  0845    PT Stop Time  0937    PT Time Calculation (min)  52 min    Activity Tolerance  Patient limited by pain    Behavior During Therapy  [facility] for tasks assessed/performed       Past Medical History:  Diagnosis Date  . Allergic rhinitis, cause unspecified   . Alzheimer's dementia (HCC)   . Arthropathy, unspecified, other specified sites   . Asymptomatic varicose veins   . Bladder incontinence   . Bowel incontinence   . Bronchitis   . Cervicalgia   . Edema   . Esophageal reflux   . Lumbago   . Obstructive sleep apnea (adult) (pediatric)   . Other malaise and fatigue   . Pain in joint, shoulder region   . Radiculopathy of lumbar region   . Reactive airway disease   . Scoliosis (and kyphoscoliosis), idiopathic   . 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 Unspecified asthma(493.90)   . Unspecified disorder of lipoid metabolism   . Unspecified vitamin D deficiency   . Vertigo     Past Surgical History:  Procedure Laterality Date  . NECK SURGERY      There were no vitals filed for this visit.  Subjective Assessment - 06/22/19 0851    Subjective  Pt reports that her shoulder pain is still the same    Patient is accompained by:  Interpreter    Currently in Pain?  Yes    Pain Score  8     Pain Location  Shoulder    Pain Orientation  Right                        [facility] Adult PT Treatment/Exercise - 06/22/19 0001      Neck Exercises: Machines for Strengthening   UBE (Upper Arm Bike)  L2 x 3 min  fwd/3 min bkwd    Cybex Row  5# 2x10    Lat Pull  15# 1x10, 10# 1x10    Other Machines for Strengthening  10# 1x10 tricep extension      Neck Exercises: Theraband   Scapula Retraction  20 reps    Scapula Retraction Limitations  yellow TB      Neck Exercises: Standing   Other Standing Exercises  shelf taps x10 flexion 1#      Lumbar Exercises: Stretches   Other Lumbar Stretch Exercise  open book thoracic rotations 1x5 B      Shoulder Exercises: ROM/Strengthening   Ball on Wall  1x10 up/down 5 sec hold      Moist Heat Therapy   Number Minutes Moist Heat  15 Minutes    Moist Heat Location  Cervical;Shoulder;Lumbar Spine      Manual Therapy   Manual Therapy  Soft tissue mobilization    Soft tissue mobilization  STM to R bicep/tricep and passive stretch               PT Short Term  Goals - 05/26/19 1609      PT SHORT TERM GOAL #1   Title  pt to be I with inital HEP    Time  3    Period  Weeks        PT Long Term Goals - 06/03/19 1640      PT LONG TERM GOAL #1   Title  pt to increase R shoulder AROM to 100 degrees for better ADL's    Status  On-going      PT LONG TERM GOAL #2   Title  increase R shoulder strength to 3/5 without pain > 4/10    Status  On-going            Plan - 06/22/19 0927    Clinical Impression Statement  Pt continues to be very limited by pain; difficulty with active and passive shoulder movements. Reporting very tender to palpation in R bicep/tricep; STM to relieve tension/pain. Pt reports relief from heat.    PT Treatment/Interventions  ADLs/Self Care Home Management;Cryotherapy;Electrical Stimulation;Moist Heat;Traction;Therapeutic exercise;Patient/family education;Manual techniques;Dry needling;Ultrasound;Gait training;Iontophoresis 4mg /ml Dexamethasone    PT Next Visit Plan  continue to work on motions and strength    Consulted and Agree with Plan of Care  Patient       Patient will benefit from skilled therapeutic intervention  in order to improve the following deficits and impairments:  Pain, Decreased mobility, Postural dysfunction, Decreased activity tolerance, Decreased range of motion, Decreased strength, Impaired flexibility  Visit Diagnosis: Cervicalgia  Abnormal posture  Chronic midline low back pain, unspecified whether sciatica present  Stiffness of right shoulder, not elsewhere classified  Chronic right shoulder pain  Muscle spasm of back     Problem List Patient Active Problem List   Diagnosis Date Noted  . MCI (mild cognitive impairment) 10/23/2016  . Chronic cough 10/19/2016  . Moderate single current episode of major depressive disorder ([place]) 12/20/2015  . Other seasonal allergic rhinitis 03/31/2015  . 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 Muscle tension headache 10/06/2014  . Depression (emotion) 10/06/2014  . Dementia arising in the senium and presenium ([place]) 10/06/2014  . Hypersomnia with sleep apnea 10/06/2014  . Neuropathy 07/08/2012  . Back pain 07/08/2012  . HYPERCHOLESTEROLEMIA 04/06/2010  . OVERWEIGHT 04/05/2010  . SKIN RASH 03/08/2010  . VERTIGO 05/06/2009  . CONJUNCTIVITIS, ALLERGIC 02/03/2009  . DOE (dyspnea on exertion) 11/03/2008  . SLEEP APNEA, OBSTRUCTIVE, MILD 07/28/2008  . VITAMIN D DEFICIENCY 06/25/2008  . FATIGUE 06/23/2008  . EDEMA 06/23/2008  . ALLERGIC RHINITIS 05/10/2008  . SHOULDER PAIN, RIGHT 04/22/2008  . NECK PAIN 10/27/2007  . REACTIVE AIRWAY DISEASE 10/01/2007  . PELVIC  PAIN 10/01/2007  . 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 POSTMENOPAUSAL STATUS 08/18/2007  . SCOLIOSIS, LUMBAR SPINE 08/18/2007  . VARICOSE VEINS, LOWER EXTREMITIES 07/28/2007  . GERD 07/28/2007  . 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 LUMBAGO 07/28/2007  . POSITIVE PPD 05/19/2007   Amador Cunas, PT, DPT Donald Prose Lenford Beddow 06/22/2019, 9:29 AM  [place]  [place], [place], [address] Phone: [phone]   Fax:  [phone]  Name: Kayla Davies MRN: [id number] Date of Birth: 08-31-1955

## 2019-06-24 ENCOUNTER — Encounter: Payer: Self-pay | Admitting: Physical Therapy

## 2019-06-24 ENCOUNTER — Ambulatory Visit: Payer: Medicare Other | Admitting: Physical Therapy

## 2019-06-24 ENCOUNTER — Other Ambulatory Visit: Payer: Self-pay

## 2019-06-24 DIAGNOSIS — M62830 Muscle spasm of back: Secondary | ICD-10-CM

## 2019-06-24 DIAGNOSIS — M25611 Stiffness of right shoulder, not elsewhere classified: Secondary | ICD-10-CM

## 2019-06-24 DIAGNOSIS — M545 Low back pain, unspecified: Secondary | ICD-10-CM

## 2019-06-24 DIAGNOSIS — M542 Cervicalgia: Secondary | ICD-10-CM

## 2019-06-24 DIAGNOSIS — G8929 Other chronic pain: Secondary | ICD-10-CM

## 2019-06-24 DIAGNOSIS — R293 Abnormal posture: Secondary | ICD-10-CM

## 2019-06-24 NOTE — Therapy (Signed)
[place], [place], [address] Phone: [phone]   Fax:  [phone]  Physical Therapy Treatment  Patient Details  Name: LAVORA BRISBON MRN: [id number] Date of Birth: 1955-09-24 Referring Provider (PT): Kevan Ny   Encounter Date: 06/24/2019  PT End of Session - 06/24/19 0903    Visit Number  9    Date for PT Re-Evaluation  06/27/19    Authorization Type  Medicare    PT Start Time  0845    PT Stop Time  0930    PT Time Calculation (min)  45 min    Activity Tolerance  Patient limited by pain    Behavior During Therapy  [facility] for tasks assessed/performed       Past Medical History:  Diagnosis Date  . Allergic rhinitis, cause unspecified   . Alzheimer's dementia ([place])   . Arthropathy, unspecified, other specified sites   . Asymptomatic varicose veins   . Bladder incontinence   . Bowel incontinence   . Bronchitis   . Cervicalgia   . Edema   . Esophageal reflux   . Lumbago   . Obstructive sleep apnea (adult) (pediatric)   . Other malaise and fatigue   . Pain in joint, shoulder region   . Radiculopathy of lumbar region   . Reactive airway disease   . Scoliosis (and kyphoscoliosis), idiopathic   . 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 Unspecified asthma(493.90)   . Unspecified disorder of lipoid metabolism   . Unspecified vitamin D deficiency   . Vertigo     Past Surgical History:  Procedure Laterality Date  . NECK SURGERY      There were no vitals filed for this visit.  Subjective Assessment - 06/24/19 0847    Subjective  Pt reports shoulder pain is the same    Patient is accompained by:  Interpreter    Currently in Pain?  Yes    Pain Score  8     Pain Location  Shoulder    Pain Orientation  Right                        [facility] Adult PT Treatment/Exercise - 06/24/19 0001      Neck Exercises: Machines for Strengthening   UBE (Upper Arm Bike)  L2 x 3 min fwd/3 min bkwd     Cybex Row  5# 2x10    Lat Pull  10# 2x10      Neck Exercises: Theraband   Scapula Retraction  20 reps;Green      Shoulder Exercises: Standing   Other Standing Exercises  wall ladder flexion/abduction 1x5, shelf taps 1# flexion/abduction 1x5    Other Standing Exercises  standing abduction/flexion AAROM with dowel x10      Moist Heat Therapy   Number Minutes Moist Heat  15 Minutes    Moist Heat Location  Cervical;Shoulder;Lumbar Spine      Manual Therapy   Manual Therapy  Soft tissue mobilization    Soft tissue mobilization  STM to R bicep/tricep and passive stretch               PT Short Term Goals - 05/26/19 1609      PT SHORT TERM GOAL #1   Title  pt to be I with inital HEP    Time  3    Period  Weeks        PT Long Term Goals -  06/03/19 1640      PT LONG TERM GOAL #1   Title  pt to increase R shoulder AROM to 100 degrees for better ADL's    Status  On-going      PT LONG TERM GOAL #2   Title  increase R shoulder strength to 3/5 without pain > 4/10    Status  On-going            Plan - 06/24/19 0935    Clinical Impression Statement  Pt still very limited by pain; difficulty with shoulder AROM and strengthening. Pt very tender and resistant to passive shoulder movements. Pt reports relief from heat. Progress report next rx.    PT Treatment/Interventions  ADLs/Self Care Home Management;Cryotherapy;Electrical Stimulation;Moist Heat;Traction;Therapeutic exercise;Patient/family education;Manual techniques;Dry needling;Ultrasound;Gait training;Iontophoresis 4mg /ml Dexamethasone    PT Next Visit Plan  continue to work on motions and strength    Consulted and Agree with Plan of Care  Patient       Patient will benefit from skilled therapeutic intervention in order to improve the following deficits and impairments:  Pain, Decreased mobility, Postural dysfunction, Decreased activity tolerance, Decreased range of motion, Decreased strength, Impaired  flexibility  Visit Diagnosis: Cervicalgia  Abnormal posture  Chronic midline low back pain, unspecified whether sciatica present  Stiffness of right shoulder, not elsewhere classified  Chronic right shoulder pain  Muscle spasm of back     Problem List Patient Active Problem List   Diagnosis Date Noted  . MCI (mild cognitive impairment) 10/23/2016  . Chronic cough 10/19/2016  . Moderate single current episode of major depressive disorder (HCC) 12/20/2015  . Other seasonal allergic rhinitis 03/31/2015  . 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 Muscle tension headache 10/06/2014  . Depression (emotion) 10/06/2014  . Dementia arising in the senium and presenium (HCC) 10/06/2014  . Hypersomnia with sleep apnea 10/06/2014  . Neuropathy 07/08/2012  . Back pain 07/08/2012  . HYPERCHOLESTEROLEMIA 04/06/2010  . OVERWEIGHT 04/05/2010  . SKIN RASH 03/08/2010  . VERTIGO 05/06/2009  . CONJUNCTIVITIS, ALLERGIC 02/03/2009  . DOE (dyspnea on exertion) 11/03/2008  . SLEEP APNEA, OBSTRUCTIVE, MILD 07/28/2008  . VITAMIN D DEFICIENCY 06/25/2008  . FATIGUE 06/23/2008  . EDEMA 06/23/2008  . ALLERGIC RHINITIS 05/10/2008  . SHOULDER PAIN, RIGHT 04/22/2008  . NECK PAIN 10/27/2007  . REACTIVE AIRWAY DISEASE 10/01/2007  . PELVIC  PAIN 10/01/2007  . 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 POSTMENOPAUSAL STATUS 08/18/2007  . SCOLIOSIS, LUMBAR SPINE 08/18/2007  . VARICOSE VEINS, LOWER EXTREMITIES 07/28/2007  . GERD 07/28/2007  . 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 LUMBAGO 07/28/2007  . POSITIVE PPD 05/19/2007   05/21/2007, PT, DPT Lysle Rubens Tillman Kazmierski 06/24/2019, 9:37 AM  [facility] [address] [place], [place], [place] Phone: [phone]   Fax:  [phone]  Name: CLOIE WOODEN MRN: Merrilee Seashore Date of Birth: [birth year]

## 2019-06-30 ENCOUNTER — Encounter: Payer: Self-pay | Admitting: Physical Therapy

## 2019-06-30 ENCOUNTER — Ambulatory Visit: Payer: Medicare Other | Attending: Internal Medicine | Admitting: Physical Therapy

## 2019-06-30 ENCOUNTER — Other Ambulatory Visit: Payer: Self-pay

## 2019-06-30 DIAGNOSIS — G8929 Other chronic pain: Secondary | ICD-10-CM | POA: Insufficient documentation

## 2019-06-30 DIAGNOSIS — M62830 Muscle spasm of back: Secondary | ICD-10-CM | POA: Diagnosis present

## 2019-06-30 DIAGNOSIS — M545 Low back pain, unspecified: Secondary | ICD-10-CM

## 2019-06-30 DIAGNOSIS — M25611 Stiffness of right shoulder, not elsewhere classified: Secondary | ICD-10-CM | POA: Insufficient documentation

## 2019-06-30 DIAGNOSIS — M25511 Pain in right shoulder: Secondary | ICD-10-CM | POA: Diagnosis present

## 2019-06-30 DIAGNOSIS — M542 Cervicalgia: Secondary | ICD-10-CM | POA: Insufficient documentation

## 2019-06-30 DIAGNOSIS — R293 Abnormal posture: Secondary | ICD-10-CM

## 2019-06-30 NOTE — Therapy (Signed)
[facility] [address] [place], [place], [address] Phone: [phone]   Fax:  [phone] Progress Note Reporting Period 05/26/19 to 06/30/19 for the first 10 visits See note below for Objective Data and Assessment of Progress/Goals.      Physical Therapy Treatment  Patient Details  Name: Kayla Davies MRN: [id number] Date of Birth: 23-Oct-1955 Referring Provider (PT): Willey Blade   Encounter Date: 06/30/2019  PT End of Session - 06/30/19 1008    Visit Number  10    Date for PT Re-Evaluation  07/30/19    Authorization Type  Medicare    PT Start Time  0924   heat   PT Stop Time  1025    PT Time Calculation (min)  61 min    Activity Tolerance  Patient limited by pain    Behavior During Therapy  [facility] for tasks assessed/performed       Past Medical History:  Diagnosis Date   Allergic rhinitis, cause unspecified    Alzheimer's dementia (HCC)    Arthropathy, unspecified, other specified sites    Asymptomatic varicose veins    Bladder incontinence    Bowel incontinence    Bronchitis    Cervicalgia    Edema    Esophageal reflux    Lumbago    Obstructive sleep apnea (adult) (pediatric)    Other malaise and fatigue    Pain in joint, shoulder region    Radiculopathy of lumbar region    Reactive airway disease    Scoliosis (and kyphoscoliosis), idiopathic    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  Subjective Assessment - 06/30/19 0931    Subjective  I feel better when I leave here    Currently in Pain?  Yes    Pain Score  8     Pain Location  Shoulder    Pain Orientation  Right    Pain Relieving Factors  heat helps, some of the exercises                         [facility] Adult PT Treatment/Exercise - 06/30/19 0001      Ambulation/Gait   Gait Comments  around the building working on her keeping up a pace and not stopping      Neck Exercises: Machines for Strengthening   UBE (Upper Arm Bike)  L3 x 3 min fwd/3 min bkwd    Nustep  level 3 x 6 min    Cybex Row  5# 2x10    Lat Pull  10# 2x10      Neck Exercises: Supine   Other Supine Exercise  chest press and shoulder flexion with wand for AAROM      Lumbar Exercises: Supine   Other Supine Lumbar Exercises  feet on ball K2C, trunk rotation      Moist Heat Therapy   Number Minutes Moist Heat  15 Minutes    Moist Heat Location  Cervical;Shoulder;Lumbar Spine      Manual Therapy   Manual Therapy  Soft tissue mobilization    Soft tissue mobilization  STM to R bicep/tricep and passive stretch  PT Short Term Goals - 05/26/19 1609      PT SHORT TERM GOAL #1   Title  pt to be I with inital HEP    Time  3    Period  Weeks        PT Long Term Goals - 06/30/19 1010      PT LONG TERM GOAL #1   Title  pt to increase R shoulder AROM to 100 degrees for better ADL's    Status  On-going      PT LONG TERM GOAL #2   Title  increase R shoulder strength to 3/5 without pain > 4/10    Status  On-going      PT LONG TERM GOAL #3   Title  increase lumbar ROM 25%    Status  On-going      PT LONG TERM GOAL #4   Title  may return to walking for exercise or her gym at her apartment    Status  On-going            Plan - 06/30/19 1008    Clinical Impression Statement  Tried to get patient moving a little more, she is still very gaurded with movements and activities due to pain.  She tolerated walking around the building without rest, just slow and with some cues to maintain speed    PT Next Visit Plan  add total body activities to try to increase her tolerance to activities and her function    Consulted and Agree with Plan of Care  Patient        Patient will benefit from skilled therapeutic intervention in order to improve the following deficits and impairments:  Pain, Decreased mobility, Postural dysfunction, Decreased activity tolerance, Decreased range of motion, Decreased strength, Impaired flexibility  Visit Diagnosis: Cervicalgia - Plan: PT plan of care cert/re-cert  Abnormal posture - Plan: PT plan of care cert/re-cert  Chronic midline low back pain, unspecified whether sciatica present - Plan: PT plan of care cert/re-cert  Stiffness of right shoulder, not elsewhere classified - Plan: PT plan of care cert/re-cert  Chronic right shoulder pain - Plan: PT plan of care cert/re-cert  Muscle spasm of back - Plan: PT plan of care cert/re-cert     Problem List Patient Active Problem List   Diagnosis Date Noted   MCI (mild cognitive impairment) 10/23/2016   Chronic cough 10/19/2016   Moderate single current episode of major depressive disorder (HCC) 12/20/2015   Other seasonal allergic rhinitis 03/31/2015   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) 03/31/2015   Muscle tension headache 10/06/2014   Depression (emotion) 10/06/2014   Dementia arising in the senium and presenium (HCC) 10/06/2014   Hypersomnia with sleep apnea 10/06/2014   Neuropathy 07/08/2012   Back pain 07/08/2012   HYPERCHOLESTEROLEMIA 04/06/2010   OVERWEIGHT 04/05/2010   SKIN RASH 03/08/2010   VERTIGO 05/06/2009   CONJUNCTIVITIS, ALLERGIC 02/03/2009   DOE (dyspnea on exertion) 11/03/2008   SLEEP APNEA, OBSTRUCTIVE, MILD 07/28/2008   VITAMIN D DEFICIENCY 06/25/2008   FATIGUE 06/23/2008   EDEMA 06/23/2008   ALLERGIC RHINITIS 05/10/2008   SHOULDER PAIN, RIGHT 04/22/2008   NECK PAIN 10/27/2007   REACTIVE AIRWAY DISEASE 10/01/2007   PELVIC  PAIN 10/01/2007   ARTHRITIS, KNEES, BILATERAL 09/01/2007   POSTMENOPAUSAL STATUS 08/18/2007   SCOLIOSIS, LUMBAR SPINE 08/18/2007   VARICOSE VEINS, LOWER EXTREMITIES 07/28/2007    GERD 07/28/2007   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

## 2019-07-01 ENCOUNTER — Encounter: Payer: Self-pay | Admitting: Physical Therapy

## 2019-07-01 ENCOUNTER — Ambulatory Visit: Payer: Medicare Other | Admitting: Physical Therapy

## 2019-07-01 DIAGNOSIS — M542 Cervicalgia: Secondary | ICD-10-CM

## 2019-07-01 DIAGNOSIS — M25611 Stiffness of right shoulder, not elsewhere classified: Secondary | ICD-10-CM

## 2019-07-01 DIAGNOSIS — R293 Abnormal posture: Secondary | ICD-10-CM

## 2019-07-01 DIAGNOSIS — M62830 Muscle spasm of back: Secondary | ICD-10-CM

## 2019-07-01 DIAGNOSIS — G8929 Other chronic pain: Secondary | ICD-10-CM

## 2019-07-01 NOTE — Therapy (Signed)
[place], [place], [address] Phone: [phone]   Fax:  [phone]  Physical Therapy Treatment  Patient Details  Name: Kayla Davies MRN: [id number] Date of Birth: [birth year] Referring Provider (PT): Kevan Ny   Encounter Date: 07/01/2019  PT End of Session - 07/01/19 1005    Visit Number  11    Date for PT Re-Evaluation  07/30/19    Authorization Type  Medicare    PT Start Time  0930   heat   PT Stop Time  1026    PT Time Calculation (min)  56 min    Activity Tolerance  Patient limited by pain    Behavior During Therapy  [facility] for tasks assessed/performed       Past Medical History:  Diagnosis Date  . Allergic rhinitis, cause unspecified   . Alzheimer's dementia ([place])   . Arthropathy, unspecified, other specified sites   . Asymptomatic varicose veins   . Bladder incontinence   . Bowel incontinence   . Bronchitis   . Cervicalgia   . Edema   . Esophageal reflux   . Lumbago   . Obstructive sleep apnea (adult) (pediatric)   . Other malaise and fatigue   . Pain in joint, shoulder region   . Radiculopathy of lumbar region   . Reactive airway disease   . Scoliosis (and kyphoscoliosis), idiopathic   . 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 Unspecified asthma(493.90)   . Unspecified disorder of lipoid metabolism   . Unspecified vitamin D deficiency   . Vertigo     Past Surgical History:  Procedure Laterality Date  . NECK SURGERY      There were no vitals filed for this visit.  Subjective Assessment - 07/01/19 0940    Subjective  I was tired yesterday after the walk    Currently in Pain?  Yes    Pain Score  7     Pain Location  Shoulder    Pain Orientation  Right    Pain Descriptors / Indicators  Pins and needles    Aggravating Factors   activity    Pain Relieving Factors  heat                        [facility] Adult PT Treatment/Exercise - 07/01/19 0001      Ambulation/Gait   Gait Comments  around the building working on her keeping up a pace and not stopping, did a little faster today      Neck Exercises: Machines for Strengthening   Nustep  level 3 x 6 min      Neck Exercises: Theraband   Scapula Retraction  20 reps;Green    Rows  20 reps;Green      Neck Exercises: Standing   Neck Retraction  10 reps    Other Standing Exercises  3# shrgs with upper trap and levator stretches      Lumbar Exercises: Standing   Wall Slides  20 reps    Wall Slides Limitations  flexion/abduction 1x5      Lumbar Exercises: Supine   Other Supine Lumbar Exercises  feet on ball K2C, trunk rotation, small bridges and isometric abs      Shoulder Exercises: Standing   Other Standing Exercises  standing abduction/flexion AAROM with dowel x10      Moist Heat Therapy   Number Minutes Moist Heat  15 Minutes    Moist Heat Location  Cervical;Shoulder;Lumbar Spine               PT Short Term Goals - 05/26/19 1609      PT SHORT TERM GOAL #1   Title  pt to be I with inital HEP    Time  3    Period  Weeks        PT Long Term Goals - 07/01/19 1007      PT LONG TERM GOAL #1   Title  pt to increase R shoulder AROM to 100 degrees for better ADL's    Status  On-going      PT LONG TERM GOAL #2   Title  increase R shoulder strength to 3/5 without pain > 4/10    Status  On-going      PT LONG TERM GOAL #3   Title  increase lumbar ROM 25%    Status  Partially Met      PT LONG TERM GOAL #4   Title  may return to walking for exercise or her gym at her apartment    Status  On-going            Plan - 07/01/19 1006    Clinical Impression Statement  Again did the walk today, we went at a faster pace, she does continue to struggle with activities, reports pain in the neck, shoulder and back with the activities.  She is very tight in the upper trap and the neck, difficulty with the W backs    PT Next Visit Plan  add total body activities to try to  increase her tolerance to activities and her function    Consulted and Agree with Plan of Care  Patient       Patient will benefit from skilled therapeutic intervention in order to improve the following deficits and impairments:  Pain, Decreased mobility, Postural dysfunction, Decreased activity tolerance, Decreased range of motion, Decreased strength, Impaired flexibility  Visit Diagnosis: Cervicalgia  Abnormal posture  Chronic midline low back pain, unspecified whether sciatica present  Stiffness of right shoulder, not elsewhere classified  Chronic right shoulder pain  Muscle spasm of back     Problem List Patient Active Problem List   Diagnosis Date Noted  . MCI (mild cognitive impairment) 10/23/2016  . Chronic cough 10/19/2016  . Moderate single current episode of major depressive disorder ([place]) 12/20/2015  . Other seasonal allergic rhinitis 03/31/2015  . Depression due to dementia ([place]) 03/31/2015  . Muscle tension headache 10/06/2014  . Depression (emotion) 10/06/2014  . Dementia arising in the senium and presenium ([place]) 10/06/2014  . Hypersomnia with sleep apnea 10/06/2014  . Neuropathy 07/08/2012  . Back pain 07/08/2012  . HYPERCHOLESTEROLEMIA 04/06/2010  . OVERWEIGHT 04/05/2010  . SKIN RASH 03/08/2010  . VERTIGO 05/06/2009  . CONJUNCTIVITIS, ALLERGIC 02/03/2009  . DOE (dyspnea on exertion) 11/03/2008  . SLEEP APNEA, OBSTRUCTIVE, MILD 07/28/2008  . VITAMIN D DEFICIENCY 06/25/2008  . FATIGUE 06/23/2008  . EDEMA 06/23/2008  . ALLERGIC RHINITIS 05/10/2008  . SHOULDER PAIN, RIGHT 04/22/2008  . NECK PAIN 10/27/2007  . REACTIVE AIRWAY DISEASE 10/01/2007  . PELVIC  PAIN 10/01/2007  . 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 POSTMENOPAUSAL STATUS 08/18/2007  . SCOLIOSIS, LUMBAR SPINE 08/18/2007  . VARICOSE VEINS, LOWER EXTREMITIES 07/28/2007  . GERD 07/28/2007  . 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 LUMBAGO 07/28/2007  . POSITIVE  PPD 05/19/2007    Sumner Boast., PT 07/01/2019, 10:08 AM  [place]  [place] [place], [place], [address] Phone: [phone]   Fax:  [phone]  Name: Kayla Davies MRN: [id number] Date of Birth: [birth year]

## 2019-07-07 ENCOUNTER — Ambulatory Visit: Payer: Medicare Other | Admitting: Physical Therapy

## 2019-07-07 ENCOUNTER — Encounter: Payer: Self-pay | Admitting: Physical Therapy

## 2019-07-07 ENCOUNTER — Other Ambulatory Visit: Payer: Self-pay

## 2019-07-07 DIAGNOSIS — R293 Abnormal posture: Secondary | ICD-10-CM

## 2019-07-07 DIAGNOSIS — M542 Cervicalgia: Secondary | ICD-10-CM

## 2019-07-07 DIAGNOSIS — M62830 Muscle spasm of back: Secondary | ICD-10-CM

## 2019-07-07 DIAGNOSIS — M545 Low back pain, unspecified: Secondary | ICD-10-CM

## 2019-07-07 DIAGNOSIS — M25611 Stiffness of right shoulder, not elsewhere classified: Secondary | ICD-10-CM

## 2019-07-07 DIAGNOSIS — G8929 Other chronic pain: Secondary | ICD-10-CM

## 2019-07-07 NOTE — Therapy (Signed)
Kayla Davies [place], [place], [address] Phone: [phone]   Fax:  [phone]  Physical Therapy Treatment  Patient Details  Name: Kayla Davies MRN: [id number] Date of Birth: 05/29/1955 Referring Provider (PT): Kevan Ny   Encounter Date: 07/07/2019  PT End of Session - 07/07/19 0841    Visit Number  12    Date for PT Re-Evaluation  07/30/19    Authorization Type  Medicare    PT Start Time  0810    PT Stop Time  0854    PT Time Calculation (min)  44 min    Activity Tolerance  Patient limited by pain    Behavior During Therapy  [facility] for tasks assessed/performed       Past Medical History:  Diagnosis Date  . Allergic rhinitis, cause unspecified   . Alzheimer's dementia ([place])   . Arthropathy, unspecified, other specified sites   . Asymptomatic varicose veins   . Bladder incontinence   . Bowel incontinence   . Bronchitis   . Cervicalgia   . Edema   . Esophageal reflux   . Lumbago   . Obstructive sleep apnea (adult) (pediatric)   . Other malaise and fatigue   . Pain in joint, shoulder region   . Radiculopathy of lumbar region   . Reactive airway disease   . Scoliosis (and kyphoscoliosis), idiopathic   . 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 Unspecified asthma(493.90)   . Unspecified disorder of lipoid metabolism   . Unspecified vitamin D deficiency   . Vertigo     Past Surgical History:  Procedure Laterality Date  . NECK SURGERY      There were no vitals filed for this visit.  Subjective Assessment - 07/07/19 0812    Subjective  Pt reports that she was pain    Currently in Pain?  Yes    Pain Score  8     Pain Location  --   Shoulders and back                       [facility] Adult PT Treatment/Exercise - 07/07/19 0001      Neck Exercises: Machines for Strengthening   UBE (Upper Arm Bike)  L3 x 3 min fwd/3 min bkwd    Nustep  level 4 x 6 min    Cybex Row  10# 2x10    Lat Pull   10# 2x10      Neck Exercises: Theraband   Rows  20 reps;Green    Shoulder External Rotation  Red;20 reps    Other Theraband Exercises  Shoulder Ext red 2x1-       Moist Heat Therapy   Number Minutes Moist Heat  15 Minutes    Moist Heat Location  Cervical;Shoulder;Lumbar Spine               PT Short Term Goals - 05/26/19 1609      PT SHORT TERM GOAL #1   Title  pt to be I with inital HEP    Time  3    Period  Weeks        PT Long Term Goals - 07/01/19 1007      PT LONG TERM GOAL #1   Title  pt to increase R shoulder AROM to 100 degrees for better ADL's    Status  On-going      PT LONG TERM GOAL #2   Title  increase R shoulder strength to 3/5 without pain > 4/10    Status  On-going      PT LONG TERM GOAL #3   Title  increase lumbar ROM 25%    Status  Partially Met      PT LONG TERM GOAL #4   Title  may return to walking for exercise or her gym at her apartment    Status  On-going            Plan - 07/07/19 0841    Clinical Impression Statement  Pt ~ 10 minutes late today reporting increase pain in shoulder and back. Postural cues needed throughout session. Cues to increase ROM with shoulder Ext. MHP for pain,    Personal Factors and Comorbidities  Age;Comorbidity 3+    Comorbidities  back pain, neck pain, right knee pain, shoulder pain    Examination-Activity Limitations  Bed Mobility;Bend;Sit;Stairs;Stand;Transfers    Examination-Participation Restrictions  Cleaning;Meal Prep    Stability/Clinical Decision Making  Evolving/Moderate complexity    Rehab Potential  Fair    PT Frequency  1x / week    PT Duration  12 weeks    PT Treatment/Interventions  ADLs/Self Care Home Management;Cryotherapy;Electrical Stimulation;Moist Heat;Traction;Therapeutic exercise;Patient/family education;Manual techniques;Dry needling;Ultrasound;Gait training;Iontophoresis 37m/ml Dexamethasone    PT Next Visit Plan  add total body activities to try to increase her tolerance to  activities and her function       Patient will benefit from skilled therapeutic intervention in order to improve the following deficits and impairments:  Pain, Decreased mobility, Postural dysfunction, Decreased activity tolerance, Decreased range of motion, Decreased strength, Impaired flexibility  Visit Diagnosis: Cervicalgia  Chronic midline low back pain, unspecified whether sciatica present  Stiffness of right shoulder, not elsewhere classified  Chronic right shoulder pain  Muscle spasm of back  Abnormal posture     Problem List Patient Active Problem List   Diagnosis Date Noted  . MCI (mild cognitive impairment) 10/23/2016  . Chronic cough 10/19/2016  . Moderate single current episode of major depressive disorder ([place] 12/20/2015  . Other seasonal allergic rhinitis 03/31/2015  . Depression due to dementia ([place] 03/31/2015  . Muscle tension headache 10/06/2014  . Depression (emotion) 10/06/2014  . Dementia arising in the senium and presenium ([place] 10/06/2014  . Hypersomnia with sleep apnea 10/06/2014  . Neuropathy 07/08/2012  . Back pain 07/08/2012  . HYPERCHOLESTEROLEMIA 04/06/2010  . OVERWEIGHT 04/05/2010  . SKIN RASH 03/08/2010  . VERTIGO 05/06/2009  . CONJUNCTIVITIS, ALLERGIC 02/03/2009  . DOE (dyspnea on exertion) 11/03/2008  . SLEEP APNEA, OBSTRUCTIVE, MILD 07/28/2008  . VITAMIN D DEFICIENCY 06/25/2008  . FATIGUE 06/23/2008  . EDEMA 06/23/2008  . ALLERGIC RHINITIS 05/10/2008  . SHOULDER PAIN, RIGHT 04/22/2008  . NECK PAIN 10/27/2007  . REACTIVE AIRWAY DISEASE 10/01/2007  . PELVIC  PAIN 10/01/2007  . 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 POSTMENOPAUSAL STATUS 08/18/2007  . SCOLIOSIS, LUMBAR SPINE 08/18/2007  . VARICOSE VEINS, LOWER EXTREMITIES 07/28/2007  . GERD 07/28/2007  . 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 LUMBAGO 07/28/2007  . POSITIVE PPD 05/19/2007    RScot Jun PTA 07/07/2019, 8:43 AM  [address] [address]Phone: [phone]  Fax:  [phone] Name: Kayla BUJAKMRN: [id number] of Birth: 701/17/1957

## 2019-07-08 ENCOUNTER — Encounter: Payer: Self-pay | Admitting: Physical Therapy

## 2019-07-08 ENCOUNTER — Ambulatory Visit: Payer: Medicare Other | Admitting: Physical Therapy

## 2019-07-08 DIAGNOSIS — M542 Cervicalgia: Secondary | ICD-10-CM

## 2019-07-08 DIAGNOSIS — M62830 Muscle spasm of back: Secondary | ICD-10-CM

## 2019-07-08 DIAGNOSIS — M25611 Stiffness of right shoulder, not elsewhere classified: Secondary | ICD-10-CM

## 2019-07-08 DIAGNOSIS — R293 Abnormal posture: Secondary | ICD-10-CM

## 2019-07-08 DIAGNOSIS — G8929 Other chronic pain: Secondary | ICD-10-CM

## 2019-07-08 NOTE — Therapy (Signed)
[place], [place], [address] Phone: [phone]   Fax:  [phone]  Physical Therapy Treatment  Patient Details  Name: Kayla Davies MRN: [id number] Date of Birth: August 17, 1955 Referring Provider (PT): Kevan Ny   Encounter Date: 07/08/2019  PT End of Session - 07/08/19 0828    Visit Number  13    Date for PT Re-Evaluation  07/30/19    Authorization Type  Medicare    PT Start Time  0800   heat   PT Stop Time  0855    PT Time Calculation (min)  55 min    Activity Tolerance  Patient limited by pain    Behavior During Therapy  [facility] for tasks assessed/performed       Past Medical History:  Diagnosis Date  . Allergic rhinitis, cause unspecified   . Alzheimer's dementia ([place])   . Arthropathy, unspecified, other specified sites   . Asymptomatic varicose veins   . Bladder incontinence   . Bowel incontinence   . Bronchitis   . Cervicalgia   . Edema   . Esophageal reflux   . Lumbago   . Obstructive sleep apnea (adult) (pediatric)   . Other malaise and fatigue   . Pain in joint, shoulder region   . Radiculopathy of lumbar region   . Reactive airway disease   . Scoliosis (and kyphoscoliosis), idiopathic   . 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 Unspecified asthma(493.90)   . Unspecified disorder of lipoid metabolism   . Unspecified vitamin D deficiency   . Vertigo     Past Surgical History:  Procedure Laterality Date  . NECK SURGERY      There were no vitals filed for this visit.  Subjective Assessment - 07/08/19 0809    Subjective  Continues to have pain , she has been able to walk here in PT with a good pace and fair speed without much increase in c/o pain    Currently in Pain?  Yes    Pain Score  8     Pain Location  Shoulder    Pain Orientation  Right    Aggravating Factors   activity    Pain Relieving Factors  heat         [facility] PT Assessment - 07/08/19 0001      AROM   Right  Shoulder Flexion  90 Degrees    Right Shoulder ABduction  80 Degrees    Right Shoulder Internal Rotation  35 Degrees    Right Shoulder External Rotation  45 Degrees                    [facility] Adult PT Treatment/Exercise - 07/08/19 0001      Neck Exercises: Machines for Strengthening   UBE (Upper Arm Bike)  L3 x 3 min fwd/3 min bkwd    Nustep  level 4 x 6 min    Cybex Row  10# 2x10    Lat Pull  10# 2x10    Other Machines for Strengthening  10# 1x10 tricep extension      Neck Exercises: Theraband   Shoulder External Rotation  Red;20 reps    Other Theraband Exercises  Shoulder Ext red 2x1-0      Neck Exercises: Standing   Neck Retraction  10 reps    Other Standing Exercises  3# shrgs with upper trap and levator stretches      Lumbar Exercises: Standing   Other  Standing Lumbar Exercises  W backs x 15      Lumbar Exercises: Supine   Other Supine Lumbar Exercises  feet on ball K2C, trunk rotation, small bridges and isometric abs      Moist Heat Therapy   Number Minutes Moist Heat  15 Minutes    Moist Heat Location  Cervical;Shoulder;Lumbar Spine               PT Short Term Goals - 05/26/19 1609      PT SHORT TERM GOAL #1   Title  pt to be I with inital HEP    Time  3    Period  Weeks        PT Long Term Goals - 07/08/19 5009      PT LONG TERM GOAL #1   Title  pt to increase R shoulder AROM to 100 degrees for better ADL's    Status  Partially Met            Plan - 07/08/19 0829    Clinical Impression Statement  Patient gave good effort with the exercises, she is very slow and needs cues, she does most exercises with her eyes closed and with c/o pain and grimacing.  She really struggles and needs verbal and tactile cues to get her core activated, she is tight and painful when trying the upper trap and levator stretches    PT Next Visit Plan  add total body activities to try to increase her tolerance to activities and her function    Consulted  and Agree with Plan of Care  Patient       Patient will benefit from skilled therapeutic intervention in order to improve the following deficits and impairments:  Pain, Decreased mobility, Postural dysfunction, Decreased activity tolerance, Decreased range of motion, Decreased strength, Impaired flexibility  Visit Diagnosis: Cervicalgia  Chronic midline low back pain, unspecified whether sciatica present  Stiffness of right shoulder, not elsewhere classified  Chronic right shoulder pain  Muscle spasm of back  Abnormal posture     Problem List Patient Active Problem List   Diagnosis Date Noted  . MCI (mild cognitive impairment) 10/23/2016  . Chronic cough 10/19/2016  . Moderate single current episode of major depressive disorder ([place]) 12/20/2015  . Other seasonal allergic rhinitis 03/31/2015  . Depression due to dementia ([place]) 03/31/2015  . Muscle tension headache 10/06/2014  . Depression (emotion) 10/06/2014  . Dementia arising in the senium and presenium ([place]) 10/06/2014  . Hypersomnia with sleep apnea 10/06/2014  . Neuropathy 07/08/2012  . Back pain 07/08/2012  . HYPERCHOLESTEROLEMIA 04/06/2010  . OVERWEIGHT 04/05/2010  . SKIN RASH 03/08/2010  . VERTIGO 05/06/2009  . CONJUNCTIVITIS, ALLERGIC 02/03/2009  . DOE (dyspnea on exertion) 11/03/2008  . SLEEP APNEA, OBSTRUCTIVE, MILD 07/28/2008  . VITAMIN D DEFICIENCY 06/25/2008  . FATIGUE 06/23/2008  . EDEMA 06/23/2008  . ALLERGIC RHINITIS 05/10/2008  . SHOULDER PAIN, RIGHT 04/22/2008  . NECK PAIN 10/27/2007  . REACTIVE AIRWAY DISEASE 10/01/2007  . PELVIC  PAIN 10/01/2007  . 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 POSTMENOPAUSAL STATUS 08/18/2007  . SCOLIOSIS, LUMBAR SPINE 08/18/2007  . VARICOSE VEINS, LOWER EXTREMITIES 07/28/2007  . GERD 07/28/2007  . 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 LUMBAGO 07/28/2007  . POSITIVE PPD 05/19/2007    Sumner Boast., PT 07/08/2019, 8:36  AM  [address] [place], [place], [address] Phone: [phone]   Fax:  [phone]  Name: SENA HOOPINGARNER MRN: [id number] Date of Birth: Mar 04, 1955

## 2019-07-09 ENCOUNTER — Other Ambulatory Visit: Payer: Self-pay | Admitting: Orthopaedic Surgery

## 2019-07-09 DIAGNOSIS — M25511 Pain in right shoulder: Secondary | ICD-10-CM

## 2019-07-09 DIAGNOSIS — M542 Cervicalgia: Secondary | ICD-10-CM

## 2019-07-13 ENCOUNTER — Encounter: Payer: Self-pay | Admitting: Physical Therapy

## 2019-07-13 ENCOUNTER — Ambulatory Visit: Payer: Medicare Other | Admitting: Physical Therapy

## 2019-07-13 ENCOUNTER — Other Ambulatory Visit: Payer: Self-pay

## 2019-07-13 DIAGNOSIS — R293 Abnormal posture: Secondary | ICD-10-CM

## 2019-07-13 DIAGNOSIS — G8929 Other chronic pain: Secondary | ICD-10-CM

## 2019-07-13 DIAGNOSIS — M542 Cervicalgia: Secondary | ICD-10-CM

## 2019-07-13 DIAGNOSIS — M62830 Muscle spasm of back: Secondary | ICD-10-CM

## 2019-07-13 DIAGNOSIS — M545 Low back pain, unspecified: Secondary | ICD-10-CM

## 2019-07-13 DIAGNOSIS — M25611 Stiffness of right shoulder, not elsewhere classified: Secondary | ICD-10-CM

## 2019-07-13 NOTE — Therapy (Signed)
[place], [place], [address] Phone: [phone]   Fax:  [phone]  Physical Therapy Treatment  Patient Details  Name: Kayla Davies MRN: [id number] Date of Birth: [birth year] Referring Provider (PT): Kevan Ny   Encounter Date: 07/13/2019   PT End of Session - 07/13/19 0842    Visit Number 14    Date for PT Re-Evaluation 07/30/19    Authorization Type Medicare    PT Start Time 0800    PT Stop Time 0850    PT Time Calculation (min) 50 min    Activity Tolerance Patient limited by pain    Behavior During Therapy [facility] for tasks assessed/performed           Past Medical History:  Diagnosis Date  . Allergic rhinitis, cause unspecified   . Alzheimer's dementia ([place])   . Arthropathy, unspecified, other specified sites   . Asymptomatic varicose veins   . Bladder incontinence   . Bowel incontinence   . Bronchitis   . Cervicalgia   . Edema   . Esophageal reflux   . Lumbago   . Obstructive sleep apnea (adult) (pediatric)   . Other malaise and fatigue   . Pain in joint, shoulder region   . Radiculopathy of lumbar region   . Reactive airway disease   . Scoliosis (and kyphoscoliosis), idiopathic   . 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 Unspecified asthma(493.90)   . Unspecified disorder of lipoid metabolism   . Unspecified vitamin D deficiency   . Vertigo     Past Surgical History:  Procedure Laterality Date  . NECK SURGERY      There were no vitals filed for this visit.   Subjective Assessment - 07/13/19 0804    Subjective Pt reports pain is the same today    Currently in Pain? Yes    Pain Score 7     Pain Location Shoulder    Pain Orientation Right                             [facility] Adult PT Treatment/Exercise - 07/13/19 0001      Neck Exercises: Machines for Strengthening   UBE (Upper Arm Bike) L3 x 3 min fwd/3 min bkwd    Cybex Row 10# 2x10    Lat Pull 10# 2x10     Other Machines for Strengthening 10# 2x10 tricep extension, 5# 2x10 bicep curl      Neck Exercises: Theraband   Shoulder External Rotation Green;20 reps    Shoulder Internal Rotation Green;20 reps      Lumbar Exercises: Supine   Other Supine Lumbar Exercises sit to stand with green medicine ball chest press and raise overhead x10      Shoulder Exercises: Standing   Other Standing Exercises shelf taps 1# flexion/abd 2x5      Moist Heat Therapy   Number Minutes Moist Heat 15 Minutes    Moist Heat Location Cervical;Shoulder;Lumbar Spine                    PT Short Term Goals - 05/26/19 1609      PT SHORT TERM GOAL #1   Title pt to be I with inital HEP    Time 3    Period Weeks             PT Long Term Goals - 07/08/19 7062  PT LONG TERM GOAL #1   Title pt to increase R shoulder AROM to 100 degrees for better ADL's    Status Partially Met                 Plan - 07/13/19 0843    Clinical Impression Statement Pt continues to be limited by pain; needed verbal/tactile cues for posture and form with ex's. Pt had most difficulty with flexion/abduction shelf taps with c/o pain during this exercise. Pt reports relief with heat.    PT Treatment/Interventions ADLs/Self Care Home Management;Cryotherapy;Electrical Stimulation;Moist Heat;Traction;Therapeutic exercise;Patient/family education;Manual techniques;Dry needling;Ultrasound;Gait training;Iontophoresis 66m/ml Dexamethasone    Consulted and Agree with Plan of Care Patient           Patient will benefit from skilled therapeutic intervention in order to improve the following deficits and impairments:  Pain, Decreased mobility, Postural dysfunction, Decreased activity tolerance, Decreased range of motion, Decreased strength, Impaired flexibility  Visit Diagnosis: Cervicalgia  Chronic midline low back pain, unspecified whether sciatica present  Stiffness of right shoulder, not elsewhere  classified  Chronic right shoulder pain  Muscle spasm of back  Abnormal posture     Problem List Patient Active Problem List   Diagnosis Date Noted  . MCI (mild cognitive impairment) 10/23/2016  . Chronic cough 10/19/2016  . Moderate single current episode of major depressive disorder ([place] 12/20/2015  . Other seasonal allergic rhinitis 03/31/2015  . Depression due to dementia ([place] 03/31/2015  . Muscle tension headache 10/06/2014  . Depression (emotion) 10/06/2014  . Dementia arising in the senium and presenium ([place] 10/06/2014  . Hypersomnia with sleep apnea 10/06/2014  . Neuropathy 07/08/2012  . Back pain 07/08/2012  . HYPERCHOLESTEROLEMIA 04/06/2010  . OVERWEIGHT 04/05/2010  . SKIN RASH 03/08/2010  . VERTIGO 05/06/2009  . CONJUNCTIVITIS, ALLERGIC 02/03/2009  . DOE (dyspnea on exertion) 11/03/2008  . SLEEP APNEA, OBSTRUCTIVE, MILD 07/28/2008  . VITAMIN D DEFICIENCY 06/25/2008  . FATIGUE 06/23/2008  . EDEMA 06/23/2008  . ALLERGIC RHINITIS 05/10/2008  . SHOULDER PAIN, RIGHT 04/22/2008  . NECK PAIN 10/27/2007  . REACTIVE AIRWAY DISEASE 10/01/2007  . PELVIC  PAIN 10/01/2007  . 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 POSTMENOPAUSAL STATUS 08/18/2007  . SCOLIOSIS, LUMBAR SPINE 08/18/2007  . VARICOSE VEINS, LOWER EXTREMITIES 07/28/2007  . GERD 07/28/2007  . 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 LUMBAGO 07/28/2007  . POSITIVE PPD 05/19/2007   AAmador Cunas PT, DPT ADonald ProseSugg 07/13/2019, 8:45 AM  [address] [address]Phone: [phone]  Fax:  [phone] Name: Kayla GORTONMRN: [id number] of Birth: 706-03-1955

## 2019-07-15 ENCOUNTER — Encounter: Payer: Self-pay | Admitting: Physical Therapy

## 2019-07-15 ENCOUNTER — Ambulatory Visit: Payer: Medicare Other | Admitting: Physical Therapy

## 2019-07-15 ENCOUNTER — Other Ambulatory Visit: Payer: Self-pay

## 2019-07-15 DIAGNOSIS — G8929 Other chronic pain: Secondary | ICD-10-CM

## 2019-07-15 DIAGNOSIS — R293 Abnormal posture: Secondary | ICD-10-CM

## 2019-07-15 DIAGNOSIS — M542 Cervicalgia: Secondary | ICD-10-CM | POA: Diagnosis not present

## 2019-07-15 DIAGNOSIS — M62830 Muscle spasm of back: Secondary | ICD-10-CM

## 2019-07-15 DIAGNOSIS — M25611 Stiffness of right shoulder, not elsewhere classified: Secondary | ICD-10-CM

## 2019-07-15 NOTE — Therapy (Signed)
[place], [place], [address] Phone: [phone]   Fax:  [phone]  Physical Therapy Treatment  Patient Details  Name: Kayla Davies MRN: [id number] Date of Birth: 1955-03-19 Referring Provider (PT): Kevan Ny   Encounter Date: 07/15/2019   PT End of Session - 07/15/19 0842    Visit Number 15    Date for PT Re-Evaluation 07/30/19    Authorization Type Medicare    PT Start Time 0800    PT Stop Time 0254    PT Time Calculation (min) 54 min    Activity Tolerance Patient limited by pain    Behavior During Therapy [facility] for tasks assessed/performed           Past Medical History:  Diagnosis Date  . Allergic rhinitis, cause unspecified   . Alzheimer's dementia ([place])   . Arthropathy, unspecified, other specified sites   . Asymptomatic varicose veins   . Bladder incontinence   . Bowel incontinence   . Bronchitis   . Cervicalgia   . Edema   . Esophageal reflux   . Lumbago   . Obstructive sleep apnea (adult) (pediatric)   . Other malaise and fatigue   . Pain in joint, shoulder region   . Radiculopathy of lumbar region   . Reactive airway disease   . Scoliosis (and kyphoscoliosis), idiopathic   . 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 Unspecified asthma(493.90)   . Unspecified disorder of lipoid metabolism   . Unspecified vitamin D deficiency   . Vertigo     Past Surgical History:  Procedure Laterality Date  . NECK SURGERY      There were no vitals filed for this visit.   Subjective Assessment - 07/15/19 0807    Subjective Pt reports same pain in shoulder today; states that low back is hurting worse than shoulder this morning    Currently in Pain? Yes    Pain Score 8     Pain Location Shoulder    Pain Orientation Right    Pain Score 8    Pain Location Back    Pain Orientation Lower                             [facility] Adult PT Treatment/Exercise - 07/15/19 0001       Neck Exercises: Machines for Strengthening   UBE (Upper Arm Bike) L3 x 3 min fwd/3 min bkwd    Nustep level 5 x 6 min   trying to keep steps/min >20   Cybex Row 10# 2x10    Cybex Chest Press 5# 2x5    Lat Pull 10# 2x10      Lumbar Exercises: Supine   Other Supine Lumbar Exercises feet on ball K2C, trunk rotation, small bridges and isometric abs      Moist Heat Therapy   Number Minutes Moist Heat 12 Minutes    Moist Heat Location Cervical;Shoulder;Lumbar Spine                    PT Short Term Goals - 05/26/19 1609      PT SHORT TERM GOAL #1   Title pt to be I with inital HEP    Time 3    Period Weeks             PT Long Term Goals - 07/08/19 0835      PT LONG TERM GOAL #1  Title pt to increase R shoulder AROM to 100 degrees for better ADL's    Status Partially Met                 Plan - 07/15/19 0842    Clinical Impression Statement Pt continues to be very limited by pain; some stretching/back ex's to address complaints of LBP. Needed verbal/tactile cues for posture with seated rows. Slow movements and limited tolerance for weight progression d/t pain. Pt reports relieft with heat.    PT Treatment/Interventions ADLs/Self Care Home Management;Cryotherapy;Electrical Stimulation;Moist Heat;Traction;Therapeutic exercise;Patient/family education;Manual techniques;Dry needling;Ultrasound;Gait training;Iontophoresis 72m/ml Dexamethasone    PT Next Visit Plan add total body activities to try to increase her tolerance to activities and her function    Consulted and Agree with Plan of Care Patient           Patient will benefit from skilled therapeutic intervention in order to improve the following deficits and impairments:  Pain, Decreased mobility, Postural dysfunction, Decreased activity tolerance, Decreased range of motion, Decreased strength, Impaired flexibility  Visit Diagnosis: Cervicalgia  Chronic midline low back pain, unspecified whether sciatica  present  Stiffness of right shoulder, not elsewhere classified  Chronic right shoulder pain  Muscle spasm of back  Abnormal posture     Problem List Patient Active Problem List   Diagnosis Date Noted  . MCI (mild cognitive impairment) 10/23/2016  . Chronic cough 10/19/2016  . Moderate single current episode of major depressive disorder ([place] 12/20/2015  . Other seasonal allergic rhinitis 03/31/2015  . Depression due to dementia ([place] 03/31/2015  . Muscle tension headache 10/06/2014  . Depression (emotion) 10/06/2014  . Dementia arising in the senium and presenium ([place] 10/06/2014  . Hypersomnia with sleep apnea 10/06/2014  . Neuropathy 07/08/2012  . Back pain 07/08/2012  . HYPERCHOLESTEROLEMIA 04/06/2010  . OVERWEIGHT 04/05/2010  . SKIN RASH 03/08/2010  . VERTIGO 05/06/2009  . CONJUNCTIVITIS, ALLERGIC 02/03/2009  . DOE (dyspnea on exertion) 11/03/2008  . SLEEP APNEA, OBSTRUCTIVE, MILD 07/28/2008  . VITAMIN D DEFICIENCY 06/25/2008  . FATIGUE 06/23/2008  . EDEMA 06/23/2008  . ALLERGIC RHINITIS 05/10/2008  . SHOULDER PAIN, RIGHT 04/22/2008  . NECK PAIN 10/27/2007  . REACTIVE AIRWAY DISEASE 10/01/2007  . PELVIC  PAIN 10/01/2007  . 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 POSTMENOPAUSAL STATUS 08/18/2007  . SCOLIOSIS, LUMBAR SPINE 08/18/2007  . VARICOSE VEINS, LOWER EXTREMITIES 07/28/2007  . GERD 07/28/2007  . 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 LUMBAGO 07/28/2007  . POSITIVE PPD 05/19/2007   AAmador Cunas PT, DPT ADonald ProseSugg 07/15/2019, 8:44 AM  [address] [address]Phone: [phone]  Fax:  [phone] Name: Kayla HAMMEMRN: [id number] of Birth: 7May 15, 1957

## 2019-07-20 ENCOUNTER — Ambulatory Visit: Payer: Medicare Other | Admitting: Physical Therapy

## 2019-07-20 ENCOUNTER — Encounter: Payer: Self-pay | Admitting: Physical Therapy

## 2019-07-20 ENCOUNTER — Other Ambulatory Visit: Payer: Self-pay | Admitting: Endocrinology

## 2019-07-20 ENCOUNTER — Other Ambulatory Visit: Payer: Self-pay

## 2019-07-20 DIAGNOSIS — M542 Cervicalgia: Secondary | ICD-10-CM

## 2019-07-20 DIAGNOSIS — M25611 Stiffness of right shoulder, not elsewhere classified: Secondary | ICD-10-CM

## 2019-07-20 DIAGNOSIS — M545 Low back pain, unspecified: Secondary | ICD-10-CM

## 2019-07-20 DIAGNOSIS — G8929 Other chronic pain: Secondary | ICD-10-CM

## 2019-07-20 DIAGNOSIS — Z1231 Encounter for screening mammogram for malignant neoplasm of breast: Secondary | ICD-10-CM

## 2019-07-20 DIAGNOSIS — M62830 Muscle spasm of back: Secondary | ICD-10-CM

## 2019-07-20 DIAGNOSIS — R293 Abnormal posture: Secondary | ICD-10-CM

## 2019-07-20 NOTE — Therapy (Signed)
[place], [place], [address] Phone: [phone]   Fax:  [phone]  Physical Therapy Treatment  Patient Details  Name: Kayla Davies MRN: [id number] Date of Birth: March 14, 1955 Referring Provider (PT): Kevan Ny   Encounter Date: 07/20/2019   PT End of Session - 07/20/19 0922    Visit Number 16    Date for PT Re-Evaluation 07/30/19    Authorization Type Medicare    PT Start Time 0841   heat   PT Stop Time 0939    PT Time Calculation (min) 58 min    Activity Tolerance Patient limited by pain    Behavior During Therapy [facility] for tasks assessed/performed           Past Medical History:  Diagnosis Date  . Allergic rhinitis, cause unspecified   . Alzheimer's dementia ([place])   . Arthropathy, unspecified, other specified sites   . Asymptomatic varicose veins   . Bladder incontinence   . Bowel incontinence   . Bronchitis   . Cervicalgia   . Edema   . Esophageal reflux   . Lumbago   . Obstructive sleep apnea (adult) (pediatric)   . Other malaise and fatigue   . Pain in joint, shoulder region   . Radiculopathy of lumbar region   . Reactive airway disease   . Scoliosis (and kyphoscoliosis), idiopathic   . 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 Unspecified asthma(493.90)   . Unspecified disorder of lipoid metabolism   . Unspecified vitamin D deficiency   . Vertigo     Past Surgical History:  Procedure Laterality Date  . NECK SURGERY      There were no vitals filed for this visit.   Subjective Assessment - 07/20/19 0843    Subjective Patient continues to report pain in the back and the shoulder and into the neck, reports that she feels better for awhile but the pain comes back    Currently in Pain? Yes    Pain Score 7     Pain Location Back    Pain Orientation Lower                             [facility] Adult PT Treatment/Exercise - 07/20/19 0001      Ambulation/Gait   Gait  Comments gait around the building, no rest, good pace      Neck Exercises: Machines for Strengthening   UBE (Upper Arm Bike) L3 x 3 min fwd/3 min bkwd    Nustep level 5 x 4 min    Cybex Row 10# 2x10    Cybex Chest Press 5# 2x5    Lat Pull 10# 2x10      Lumbar Exercises: Standing   Other Standing Lumbar Exercises 3# marches, hip abdiuction and extnesion      Lumbar Exercises: Seated   Long Arc Quad on Chair Both;2 sets;10 reps    LAQ on Chair Weights (lbs) 3    Other Seated Lumbar Exercises seated 3# marches      Lumbar Exercises: Supine   Other Supine Lumbar Exercises feet on ball K2C, trunk rotation, small bridges and isometric abs      Moist Heat Therapy   Number Minutes Moist Heat 12 Minutes    Moist Heat Location Cervical;Shoulder;Lumbar Spine                    PT Short Term Goals -  05/26/19 1609      PT SHORT TERM GOAL #1   Title pt to be I with inital HEP    Time 3    Period Weeks             PT Long Term Goals - 07/08/19 7425      PT LONG TERM GOAL #1   Title pt to increase R shoulder AROM to 100 degrees for better ADL's    Status Partially Met                 Plan - 07/20/19 0923    Clinical Impression Statement Patient continues to move very slowly and need encouragement to do the exercises, she is doing more exercises than previously but again is limited by pain.  I have spoken with her and the interpreter and told them that we will d/c next week, we will want to work on what she can and will do at home to stay moving and functioning    PT Next Visit Plan work on what she can and will do at home to keep her moving and functioning    Consulted and Agree with Plan of Care Patient           Patient will benefit from skilled therapeutic intervention in order to improve the following deficits and impairments:  Pain, Decreased mobility, Postural dysfunction, Decreased activity tolerance, Decreased range of motion, Decreased strength,  Impaired flexibility  Visit Diagnosis: Cervicalgia  Chronic midline low back pain, unspecified whether sciatica present  Stiffness of right shoulder, not elsewhere classified  Chronic right shoulder pain  Muscle spasm of back  Abnormal posture     Problem List Patient Active Problem List   Diagnosis Date Noted  . MCI (mild cognitive impairment) 10/23/2016  . Chronic cough 10/19/2016  . Moderate single current episode of major depressive disorder ([place]) 12/20/2015  . Other seasonal allergic rhinitis 03/31/2015  . 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 Muscle tension headache 10/06/2014  . Depression (emotion) 10/06/2014  . Dementia arising in the senium and presenium ([place]) 10/06/2014  . Hypersomnia with sleep apnea 10/06/2014  . Neuropathy 07/08/2012  . Back pain 07/08/2012  . HYPERCHOLESTEROLEMIA 04/06/2010  . OVERWEIGHT 04/05/2010  . SKIN RASH 03/08/2010  . VERTIGO 05/06/2009  . CONJUNCTIVITIS, ALLERGIC 02/03/2009  . DOE (dyspnea on exertion) 11/03/2008  . SLEEP APNEA, OBSTRUCTIVE, MILD 07/28/2008  . VITAMIN D DEFICIENCY 06/25/2008  . FATIGUE 06/23/2008  . EDEMA 06/23/2008  . ALLERGIC RHINITIS 05/10/2008  . SHOULDER PAIN, RIGHT 04/22/2008  . NECK PAIN 10/27/2007  . REACTIVE AIRWAY DISEASE 10/01/2007  . PELVIC  PAIN 10/01/2007  . 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 POSTMENOPAUSAL STATUS 08/18/2007  . SCOLIOSIS, LUMBAR SPINE 08/18/2007  . VARICOSE VEINS, LOWER EXTREMITIES 07/28/2007  . GERD 07/28/2007  . 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 LUMBAGO 07/28/2007  . POSITIVE PPD 05/19/2007    Sumner Boast., PT 07/20/2019, 9:26 AM  [facility] [place], [place], [address] Phone: [phone]   Fax:  [phone]  Name: Kayla Davies MRN: [id number] Date of Birth: 1955/02/25

## 2019-07-22 ENCOUNTER — Encounter: Payer: Self-pay | Admitting: Physical Therapy

## 2019-07-22 ENCOUNTER — Other Ambulatory Visit: Payer: Self-pay

## 2019-07-22 ENCOUNTER — Ambulatory Visit: Payer: Medicare Other | Admitting: Physical Therapy

## 2019-07-22 DIAGNOSIS — M542 Cervicalgia: Secondary | ICD-10-CM

## 2019-07-22 DIAGNOSIS — M545 Low back pain, unspecified: Secondary | ICD-10-CM

## 2019-07-22 DIAGNOSIS — M25611 Stiffness of right shoulder, not elsewhere classified: Secondary | ICD-10-CM

## 2019-07-22 DIAGNOSIS — R293 Abnormal posture: Secondary | ICD-10-CM

## 2019-07-22 DIAGNOSIS — G8929 Other chronic pain: Secondary | ICD-10-CM

## 2019-07-22 DIAGNOSIS — M62830 Muscle spasm of back: Secondary | ICD-10-CM

## 2019-07-22 NOTE — Therapy (Signed)
[place], [place], [address] Phone: [phone]   Fax:  [phone]  Physical Therapy Treatment  Patient Details  Name: Kayla Davies MRN: [id number] Date of Birth: April 13, 1955 Referring Provider (PT): Kevan Ny   Encounter Date: 07/22/2019   PT End of Session - 07/22/19 0844    Visit Number 17    Date for PT Re-Evaluation 07/30/19    Authorization Type Medicare    PT Start Time 0803   heat   PT Stop Time 0900    PT Time Calculation (min) 57 min    Activity Tolerance Patient limited by pain    Behavior During Therapy [facility] for tasks assessed/performed           Past Medical History:  Diagnosis Date  . Allergic rhinitis, cause unspecified   . Alzheimer's dementia ([place])   . Arthropathy, unspecified, other specified sites   . Asymptomatic varicose veins   . Bladder incontinence   . Bowel incontinence   . Bronchitis   . Cervicalgia   . Edema   . Esophageal reflux   . Lumbago   . Obstructive sleep apnea (adult) (pediatric)   . Other malaise and fatigue   . Pain in joint, shoulder region   . Radiculopathy of lumbar region   . Reactive airway disease   . Scoliosis (and kyphoscoliosis), idiopathic   . 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 Unspecified asthma(493.90)   . Unspecified disorder of lipoid metabolism   . Unspecified vitamin D deficiency   . Vertigo     Past Surgical History:  Procedure Laterality Date  . NECK SURGERY      There were no vitals filed for this visit.   Subjective Assessment - 07/22/19 0810    Subjective Patient reports pain more in the neck today, unsure of why    Currently in Pain? Yes    Pain Score 7     Pain Location Neck    Pain Relieving Factors heat              [facility] PT Assessment - 07/22/19 0001      AROM   Right Shoulder Flexion 120 Degrees                         [facility] Adult PT Treatment/Exercise - 07/22/19 0001      Neck  Exercises: Machines for Strengthening   UBE (Upper Arm Bike) L3 x 3 min fwd/3 min bkwd    Nustep level 5 x 6 min      Neck Exercises: Standing   Other Standing Exercises 3# shrugs with upper trap and levator stretches    Other Standing Exercises ball pass around the waist two directions 10x each way, AAROM with wand      Lumbar Exercises: Stretches   Passive Hamstring Stretch Left;Right;3 reps;20 seconds    Piriformis Stretch Right;Left;3 reps;20 seconds      Lumbar Exercises: Standing   Other Standing Lumbar Exercises W backs x 15    Other Standing Lumbar Exercises 3# marches, hip abdiuction and extnesion      Lumbar Exercises: Seated   Long Arc Quad on Chair Both;2 sets;10 reps    LAQ on Chair Weights (lbs) 3    Other Seated Lumbar Exercises seated 3# marches      Lumbar Exercises: Supine   Other Supine Lumbar Exercises feet on ball K2C, trunk rotation, small bridges and isometric  abs      Moist Heat Therapy   Number Minutes Moist Heat 12 Minutes    Moist Heat Location Cervical;Shoulder;Lumbar Spine                    PT Short Term Goals - 05/26/19 1609      PT SHORT TERM GOAL #1   Title pt to be I with inital HEP    Time 3    Period Weeks             PT Long Term Goals - 07/22/19 0849      PT LONG TERM GOAL #1   Title pt to increase R shoulder AROM to 100 degrees for better ADL's    Status Achieved                 Plan - 07/22/19 0845    Clinical Impression Statement Patient did well with the LE exercises.  Spoke with her about walking at home and doing some of the easy sitting and standing exercises that we are doing today without weight for her HEP.    PT Next Visit Plan work on what she can and will do at home to keep her moving and functioning    Consulted and Agree with Plan of Care Patient           Patient will benefit from skilled therapeutic intervention in order to improve the following deficits and impairments:  Pain, Decreased  mobility, Postural dysfunction, Decreased activity tolerance, Decreased range of motion, Decreased strength, Impaired flexibility  Visit Diagnosis: Cervicalgia  Chronic midline low back pain, unspecified whether sciatica present  Stiffness of right shoulder, not elsewhere classified  Chronic right shoulder pain  Muscle spasm of back  Abnormal posture     Problem List Patient Active Problem List   Diagnosis Date Noted  . MCI (mild cognitive impairment) 10/23/2016  . Chronic cough 10/19/2016  . Moderate single current episode of major depressive disorder ([facility]) 12/20/2015  . Other seasonal allergic rhinitis 03/31/2015  . 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 Muscle tension headache 10/06/2014  . Depression (emotion) 10/06/2014  . Dementia arising in the senium and presenium ([facility]) 10/06/2014  . Hypersomnia with sleep apnea 10/06/2014  . Neuropathy 07/08/2012  . Back pain 07/08/2012  . HYPERCHOLESTEROLEMIA 04/06/2010  . OVERWEIGHT 04/05/2010  . SKIN RASH 03/08/2010  . VERTIGO 05/06/2009  . CONJUNCTIVITIS, ALLERGIC 02/03/2009  . DOE (dyspnea on exertion) 11/03/2008  . SLEEP APNEA, OBSTRUCTIVE, MILD 07/28/2008  . VITAMIN D DEFICIENCY 06/25/2008  . FATIGUE 06/23/2008  . EDEMA 06/23/2008  . ALLERGIC RHINITIS 05/10/2008  . SHOULDER PAIN, RIGHT 04/22/2008  . NECK PAIN 10/27/2007  . REACTIVE AIRWAY DISEASE 10/01/2007  . PELVIC  PAIN 10/01/2007  . 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 POSTMENOPAUSAL STATUS 08/18/2007  . SCOLIOSIS, LUMBAR SPINE 08/18/2007  . VARICOSE VEINS, LOWER EXTREMITIES 07/28/2007  . GERD 07/28/2007  . 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 LUMBAGO 07/28/2007  . POSITIVE PPD 05/19/2007    Jearld Lesch., PT 07/22/2019, 8:50 AM  [facility] [address] [place], [place], [address] Phone: [phone]   Fax:  [phone]  Name: Kayla Davies MRN: [id number] Date of Birth: February 19, 1955

## 2019-07-24 ENCOUNTER — Other Ambulatory Visit: Payer: Self-pay

## 2019-07-24 ENCOUNTER — Ambulatory Visit
Admission: RE | Admit: 2019-07-24 | Discharge: 2019-07-24 | Disposition: A | Discharge status: Home / Self Care | Payer: Medicare Other | Source: Ambulatory Visit | Attending: Endocrinology | Admitting: Endocrinology

## 2019-07-24 DIAGNOSIS — Z1231 Encounter for screening mammogram for malignant neoplasm of breast: Secondary | ICD-10-CM

## 2019-07-27 ENCOUNTER — Other Ambulatory Visit: Payer: Self-pay

## 2019-07-27 ENCOUNTER — Encounter: Payer: Self-pay | Admitting: Physical Therapy

## 2019-07-27 ENCOUNTER — Ambulatory Visit: Payer: Medicare Other | Admitting: Physical Therapy

## 2019-07-27 DIAGNOSIS — M542 Cervicalgia: Secondary | ICD-10-CM

## 2019-07-27 DIAGNOSIS — M25611 Stiffness of right shoulder, not elsewhere classified: Secondary | ICD-10-CM

## 2019-07-27 DIAGNOSIS — M62830 Muscle spasm of back: Secondary | ICD-10-CM

## 2019-07-27 DIAGNOSIS — R293 Abnormal posture: Secondary | ICD-10-CM

## 2019-07-27 DIAGNOSIS — G8929 Other chronic pain: Secondary | ICD-10-CM

## 2019-07-27 DIAGNOSIS — M25511 Pain in right shoulder: Secondary | ICD-10-CM

## 2019-07-27 NOTE — Patient Instructions (Signed)
Access Code: [email or web address] URL: [email or web address] Date: 07/27/2019 Prepared by: Stacie Glaze  Exercises Seated March - 1 x daily - 7 x weekly - 3 sets - 10 reps - 2 hold Standing March with Counter Support - 1 x daily - 7 x weekly - 3 sets - 10 reps - 2 hold Seated Long Arc Quad - 1 x daily - 7 x weekly - 3 sets - 10 reps - 2 hold Standing Hip Abduction with Counter Support - 1 x daily - 7 x weekly - 3 sets - 10 reps - 2 hold Seated Hip Adduction Squeeze with Ball - 1 x daily - 7 x weekly - 3 sets - 10 reps - 2 hold Seated Heel Raise - 1 x daily - 7 x weekly - 3 sets - 10 reps - 2 hold Seated Toe Raise - 1 x daily - 7 x weekly - 3 sets - 10 reps - 2 hold Standing Hip Extension with Counter Support - 1 x daily - 7 x weekly - 3 sets - 10 reps - 2 hold

## 2019-07-27 NOTE — Therapy (Signed)
[facility] [place] [address] [place], [place], [address] Phone: [phone]   Fax:  [phone]  Physical Therapy Treatment  Patient Details  Name: Kayla Davies MRN: [id number] Date of Birth: 04-24-1955 Referring Provider (PT): Willey Blade   Encounter Date: 07/27/2019   PT End of Session - 07/27/19 0915    Visit Number 18    Date for PT Re-Evaluation 07/30/19    Authorization Type Medicare    PT Start Time 0840    PT Stop Time 0930    PT Time Calculation (min) 50 min    Activity Tolerance Patient limited by pain    Behavior During Therapy [facility] for tasks assessed/performed           Past Medical History:  Diagnosis Date  . Allergic rhinitis, cause unspecified   . Alzheimer's dementia ([facility])   . Arthropathy, unspecified, other specified sites   . Asymptomatic varicose veins   . Bladder incontinence   . Bowel incontinence   . Bronchitis   . Cervicalgia   . Edema   . Esophageal reflux   . Lumbago   . Obstructive sleep apnea (adult) (pediatric)   . Other malaise and fatigue   . Pain in joint, shoulder region   . Radiculopathy of lumbar region   . Reactive airway disease   . Scoliosis (and kyphoscoliosis), idiopathic   . 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 Unspecified asthma(493.90)   . Unspecified disorder of lipoid metabolism   . Unspecified vitamin D deficiency   . Vertigo     Past Surgical History:  Procedure Laterality Date  . NECK SURGERY      There were no vitals filed for this visit.   Subjective Assessment - 07/27/19 0846    Subjective Patient continues to have pain in the neck, shoulder and back.    Currently in Pain? Yes    Pain Score 7     Pain Location Neck    Pain Relieving Factors heat helps                             [facility] Adult PT Treatment/Exercise - 07/27/19 0001      Neck Exercises: Machines for Strengthening   UBE (Upper Arm Bike) L3 x 3 min fwd/3 min bkwd     Nustep level 5 x 6 min      Neck Exercises: Standing   Other Standing Exercises ball pass around the waist two directions 10x each way, AAROM with wand      Lumbar Exercises: Standing   Other Standing Lumbar Exercises W backs x 15    Other Standing Lumbar Exercises 3# marches, hip abdiuction and extnesion      Lumbar Exercises: Seated   Long Arc Quad on Chair Both;2 sets;10 reps    LAQ on Chair Weights (lbs) 3    Other Seated Lumbar Exercises seated 3# marches    Other Seated Lumbar Exercises heel raises toe raises      Lumbar Exercises: Supine   Other Supine Lumbar Exercises feet on ball K2C, trunk rotation, small bridges and isometric abs                    PT Short Term Goals - 05/26/19 1609      PT SHORT TERM GOAL #1   Title pt to be I with inital HEP    Time 3    Period  Weeks             PT Long Term Goals - 07/22/19 0849      PT LONG TERM GOAL #1   Title pt to increase R shoulder AROM to 100 degrees for better ADL's    Status Achieved                 Plan - 07/27/19 0915    Clinical Impression Statement Patient seems to understand the importance of continuing to do the HEP, I gave her the LE HEP today and went over with her for sitting and standing.  She does need encouragement as she really continues to report pain    PT Next Visit Plan next visit give UE HEP and possibly supine exercises and then D/C    Consulted and Agree with Plan of Care Patient           Patient will benefit from skilled therapeutic intervention in order to improve the following deficits and impairments:  Pain, Decreased mobility, Postural dysfunction, Decreased activity tolerance, Decreased range of motion, Decreased strength, Impaired flexibility  Visit Diagnosis: Cervicalgia  Chronic midline low back pain, unspecified whether sciatica present  Stiffness of right shoulder, not elsewhere classified  Chronic right shoulder pain  Muscle spasm of back  Abnormal  posture     Problem List Patient Active Problem List   Diagnosis Date Noted  . MCI (mild cognitive impairment) 10/23/2016  . Chronic cough 10/19/2016  . Moderate single current episode of major depressive disorder (HCC) 12/20/2015  . Other seasonal allergic rhinitis 03/31/2015  . 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 Muscle tension headache 10/06/2014  . Depression (emotion) 10/06/2014  . Dementia arising in the senium and presenium (HCC) 10/06/2014  . Hypersomnia with sleep apnea 10/06/2014  . Neuropathy 07/08/2012  . Back pain 07/08/2012  . HYPERCHOLESTEROLEMIA 04/06/2010  . OVERWEIGHT 04/05/2010  . SKIN RASH 03/08/2010  . VERTIGO 05/06/2009  . CONJUNCTIVITIS, ALLERGIC 02/03/2009  . DOE (dyspnea on exertion) 11/03/2008  . SLEEP APNEA, OBSTRUCTIVE, MILD 07/28/2008  . VITAMIN D DEFICIENCY 06/25/2008  . FATIGUE 06/23/2008  . EDEMA 06/23/2008  . ALLERGIC RHINITIS 05/10/2008  . SHOULDER PAIN, RIGHT 04/22/2008  . NECK PAIN 10/27/2007  . REACTIVE AIRWAY DISEASE 10/01/2007  . PELVIC  PAIN 10/01/2007  . 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 POSTMENOPAUSAL STATUS 08/18/2007  . SCOLIOSIS, LUMBAR SPINE 08/18/2007  . VARICOSE VEINS, LOWER EXTREMITIES 07/28/2007  . GERD 07/28/2007  . 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 LUMBAGO 07/28/2007  . POSITIVE PPD 05/19/2007    Jearld Lesch., PT 07/27/2019, 9:17 AM  [facility] [address] [place], [place], [address] Phone: [phone]   Fax:  [phone]  Name: Kayla Davies MRN: [id number] Date of Birth: 04/15/1955

## 2019-07-29 ENCOUNTER — Encounter: Payer: Self-pay | Admitting: Physical Therapy

## 2019-07-29 ENCOUNTER — Ambulatory Visit: Payer: Medicare Other | Admitting: Physical Therapy

## 2019-07-29 ENCOUNTER — Other Ambulatory Visit: Payer: Self-pay

## 2019-07-29 DIAGNOSIS — M542 Cervicalgia: Secondary | ICD-10-CM | POA: Diagnosis not present

## 2019-07-29 DIAGNOSIS — R293 Abnormal posture: Secondary | ICD-10-CM

## 2019-07-29 DIAGNOSIS — M25611 Stiffness of right shoulder, not elsewhere classified: Secondary | ICD-10-CM

## 2019-07-29 DIAGNOSIS — G8929 Other chronic pain: Secondary | ICD-10-CM

## 2019-07-29 DIAGNOSIS — M62830 Muscle spasm of back: Secondary | ICD-10-CM

## 2019-07-29 NOTE — Therapy (Signed)
[place], [place], [address] Phone: [phone]   Fax:  [phone]  Physical Therapy Treatment  Patient Details  Name: Kayla Davies MRN: [id number] Date of Birth: Apr 19, 1955 Referring Provider (PT): Kevan Ny   Encounter Date: 07/29/2019   PT End of Session - 07/29/19 0927    Visit Number 19    PT Start Time 5726    PT Stop Time 0931    PT Time Calculation (min) 44 min    Activity Tolerance Patient limited by pain    Behavior During Therapy [facility] for tasks assessed/performed           Past Medical History:  Diagnosis Date  . Allergic rhinitis, cause unspecified   . Alzheimer's dementia ([place])   . Arthropathy, unspecified, other specified sites   . Asymptomatic varicose veins   . Bladder incontinence   . Bowel incontinence   . Bronchitis   . Cervicalgia   . Edema   . Esophageal reflux   . Lumbago   . Obstructive sleep apnea (adult) (pediatric)   . Other malaise and fatigue   . Pain in joint, shoulder region   . Radiculopathy of lumbar region   . Reactive airway disease   . Scoliosis (and kyphoscoliosis), idiopathic   . 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 Unspecified asthma(493.90)   . Unspecified disorder of lipoid metabolism   . Unspecified vitamin D deficiency   . Vertigo     Past Surgical History:  Procedure Laterality Date  . NECK SURGERY      There were no vitals filed for this visit.   Subjective Assessment - 07/29/19 0849    Subjective Patient reports doing okay, has some questions about HEP    Currently in Pain? Yes    Pain Score 6     Pain Location Neck                             [facility] Adult PT Treatment/Exercise - 07/29/19 0001      Neck Exercises: Machines for Strengthening   UBE (Upper Arm Bike) L3 x 3 min fwd/3 min bkwd    Nustep level 5 x 6 min      Neck Exercises: Standing   Other Standing Exercises 3# shrugs with upper trap and levator  stretches    Other Standing Exercises ball pass around the waist two directions 10x each way, AAROM with wand                  PT Education - 07/29/19 2035    Education Details Reviewed the HEP I gave her for LE's while sitting, standing at home and encouraged walking.  I gave UE HEP with red tband, we performed    Person(s) Educated Patient    Methods Explanation;Demonstration;Tactile cues;Verbal cues;Handout    Comprehension Verbalized understanding;Returned demonstration;Verbal cues required            PT Short Term Goals - 05/26/19 1609      PT SHORT TERM GOAL #1   Title pt to be I with inital HEP    Time 3    Period Weeks             PT Long Term Goals - 07/29/19 5974      PT LONG TERM GOAL #1   Title pt to increase R shoulder AROM to 100 degrees for better ADL's  Status Achieved      PT LONG TERM GOAL #2   Title increase R shoulder strength to 3/5 without pain > 4/10    Status Partially Met      PT LONG TERM GOAL #3   Title increase lumbar ROM 25%    Status Partially Met      PT LONG TERM GOAL #4   Title may return to walking for exercise or her gym at her apartment    Status Achieved                 Plan - 07/29/19 0928    Clinical Impression Statement Patient reports that after PT she gets 3-5 days of pain relief, however she always reports pain a 5-7/10 when asked even if it is 2 days after a PT treatment, she c/o burning in the feet and the low back, "like fire"  I feel that she needs to stay active at home and have told this to the patient, the interpreter and her daughter.    PT Next Visit Plan D/C plateau toward goals    Consulted and Agree with Plan of Care Patient           Patient will benefit from skilled therapeutic intervention in order to improve the following deficits and impairments:  Pain, Decreased mobility, Postural dysfunction, Decreased activity tolerance, Decreased range of motion, Decreased strength, Impaired  flexibility  Visit Diagnosis: Cervicalgia  Chronic midline low back pain, unspecified whether sciatica present  Stiffness of right shoulder, not elsewhere classified  Chronic right shoulder pain  Muscle spasm of back  Abnormal posture     Problem List Patient Active Problem List   Diagnosis Date Noted  . MCI (mild cognitive impairment) 10/23/2016  . Chronic cough 10/19/2016  . Moderate single current episode of major depressive disorder ([place]) 12/20/2015  . Other seasonal allergic rhinitis 03/31/2015  . Depression due to dementia ([place]) 03/31/2015  . Muscle tension headache 10/06/2014  . Depression (emotion) 10/06/2014  . Dementia arising in the senium and presenium ([place]) 10/06/2014  . Hypersomnia with sleep apnea 10/06/2014  . Neuropathy 07/08/2012  . Back pain 07/08/2012  . HYPERCHOLESTEROLEMIA 04/06/2010  . OVERWEIGHT 04/05/2010  . SKIN RASH 03/08/2010  . VERTIGO 05/06/2009  . CONJUNCTIVITIS, ALLERGIC 02/03/2009  . DOE (dyspnea on exertion) 11/03/2008  . SLEEP APNEA, OBSTRUCTIVE, MILD 07/28/2008  . VITAMIN D DEFICIENCY 06/25/2008  . FATIGUE 06/23/2008  . EDEMA 06/23/2008  . ALLERGIC RHINITIS 05/10/2008  . SHOULDER PAIN, RIGHT 04/22/2008  . NECK PAIN 10/27/2007  . REACTIVE AIRWAY DISEASE 10/01/2007  . PELVIC  PAIN 10/01/2007  . 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 POSTMENOPAUSAL STATUS 08/18/2007  . SCOLIOSIS, LUMBAR SPINE 08/18/2007  . VARICOSE VEINS, LOWER EXTREMITIES 07/28/2007  . GERD 07/28/2007  . 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 LUMBAGO 07/28/2007  . POSITIVE PPD 05/19/2007    Sumner Boast., PT 07/29/2019, 9:30 AM  [place], [place], [address] Phone: [phone]   Fax:  [phone]  Name: Kayla Davies MRN: [id number] Date of Birth: [birth year]

## 2019-08-11 ENCOUNTER — Other Ambulatory Visit: Payer: Medicare Other

## 2019-08-15 ENCOUNTER — Ambulatory Visit
Admission: RE | Admit: 2019-08-15 | Discharge: 2019-08-15 | Disposition: A | Discharge status: Home / Self Care | Payer: Medicare Other | Source: Ambulatory Visit | Attending: Orthopaedic Surgery | Admitting: Orthopaedic Surgery

## 2019-08-15 ENCOUNTER — Other Ambulatory Visit: Payer: Self-pay

## 2019-08-15 ENCOUNTER — Other Ambulatory Visit: Payer: Medicare Other

## 2019-08-15 DIAGNOSIS — M25511 Pain in right shoulder: Secondary | ICD-10-CM

## 2019-09-03 ENCOUNTER — Other Ambulatory Visit: Payer: Self-pay

## 2019-09-03 ENCOUNTER — Ambulatory Visit
Admission: RE | Admit: 2019-09-03 | Discharge: 2019-09-03 | Disposition: A | Discharge status: Home / Self Care | Payer: Medicare Other | Source: Ambulatory Visit | Attending: Orthopaedic Surgery | Admitting: Orthopaedic Surgery

## 2019-09-03 DIAGNOSIS — M542 Cervicalgia: Secondary | ICD-10-CM

## 2020-03-22 ENCOUNTER — Other Ambulatory Visit: Payer: Self-pay

## 2020-03-22 ENCOUNTER — Ambulatory Visit (HOSPITAL_COMMUNITY)
Admission: EM | Admit: 2020-03-22 | Discharge: 2020-03-22 | Disposition: A | Discharge status: Home / Self Care | Payer: Medicare Other | Attending: Student | Admitting: Student

## 2020-03-22 ENCOUNTER — Encounter (HOSPITAL_COMMUNITY): Payer: Self-pay | Admitting: Emergency Medicine

## 2020-03-22 DIAGNOSIS — L03312 Cellulitis of back [any part except buttock]: Secondary | ICD-10-CM

## 2020-03-22 DIAGNOSIS — F0280 Dementia in other diseases classified elsewhere without behavioral disturbance: Secondary | ICD-10-CM

## 2020-03-22 DIAGNOSIS — J069 Acute upper respiratory infection, unspecified: Secondary | ICD-10-CM | POA: Diagnosis not present

## 2020-03-22 DIAGNOSIS — Z1152 Encounter for screening for COVID-19: Secondary | ICD-10-CM | POA: Diagnosis not present

## 2020-03-22 DIAGNOSIS — J4540 Moderate persistent asthma, uncomplicated: Secondary | ICD-10-CM

## 2020-03-22 DIAGNOSIS — B372 Candidiasis of skin and nail: Secondary | ICD-10-CM

## 2020-03-22 DIAGNOSIS — G309 Alzheimer's disease, unspecified: Secondary | ICD-10-CM

## 2020-03-22 MED ORDER — BENZONATATE 100 MG PO CAPS: 100.0000 mg | ORAL_CAPSULE | Freq: Three times a day (TID) | ORAL | 0 refills | Status: AC

## 2020-03-22 MED ORDER — DOXYCYCLINE HYCLATE 100 MG PO CAPS: 100.0000 mg | ORAL_CAPSULE | Freq: Two times a day (BID) | ORAL | 0 refills | Status: AC

## 2020-03-22 MED ORDER — NYSTATIN 100000 UNIT/GM EX CREA: TOPICAL_CREAM | CUTANEOUS | 0 refills | Status: AC

## 2020-03-22 NOTE — ED Triage Notes (Signed)
*  Has interpreter with her*. Pt presents today with c/o rash to left upper back x 2 days. Painful to touch.

## 2020-03-22 NOTE — ED Provider Notes (Signed)
[facility]    CSN: [id number] Arrival date & time: 03/22/20  1812      History   Chief Complaint No chief complaint on file.   HPI Kayla Davies is a 65 y.o. female presenting with multiple complaints. History alzheimers, bladder incontinence, cervicalgia, bronchitis, scoliosis, cognitive impairment, depression.  -states she has had a red, painful rash on her side and L forearm for 1 month. It's itchy and painful. States it's getting worse. Denies fevers/chills, discharge. Patient is very concerned about this rash and is insisting she requires Xray. Denies any trauma or bony tenderness. -also states she's had a new cough for the last week. Denies other URI symptoms. Asthma is well controlled on albuterol and proventil inhalers. Denies shortness of breath, chest pain. Denies fevers/chills, n/v/d, shortness of breath, chest pain, congestion, facial pain, teeth pain, headaches, sore throat, loss of taste/smell, swollen lymph nodes, ear pain.  -Alzheimers/cognitive impairment- this patient repeated and alternated between the same two questions over 5 times each throughout the course of this visit.   HPI  Past Medical History:  Diagnosis Date  . Allergic rhinitis, cause unspecified   . Alzheimer's dementia (HCC)   . Arthropathy, unspecified, other specified sites   . Asymptomatic varicose veins   . Bladder incontinence   . Bowel incontinence   . Bronchitis   . Cervicalgia   . Edema   . Esophageal reflux   . Lumbago   . Obstructive sleep apnea (adult) (pediatric)   . Other malaise and fatigue   . Pain in joint, shoulder region   . Radiculopathy of lumbar region   . Reactive airway disease   . Scoliosis (and kyphoscoliosis), idiopathic   . 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 Unspecified asthma(493.90)   . Unspecified disorder of lipoid metabolism   . Unspecified vitamin D deficiency   . Vertigo     Patient Active Problem List   Diagnosis Date Noted  . MCI (mild  cognitive impairment) 10/23/2016  . Chronic cough 10/19/2016  . Moderate single current episode of major depressive disorder (HCC) 12/20/2015  . Other seasonal allergic rhinitis 03/31/2015  . 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 Muscle tension headache 10/06/2014  . Depression (emotion) 10/06/2014  . Dementia arising in the senium and presenium (HCC) 10/06/2014  . Hypersomnia with sleep apnea 10/06/2014  . Neuropathy 07/08/2012  . Back pain 07/08/2012  . HYPERCHOLESTEROLEMIA 04/06/2010  . OVERWEIGHT 04/05/2010  . SKIN RASH 03/08/2010  . VERTIGO 05/06/2009  . CONJUNCTIVITIS, ALLERGIC 02/03/2009  . DOE (dyspnea on exertion) 11/03/2008  . SLEEP APNEA, OBSTRUCTIVE, MILD 07/28/2008  . VITAMIN D DEFICIENCY 06/25/2008  . FATIGUE 06/23/2008  . EDEMA 06/23/2008  . ALLERGIC RHINITIS 05/10/2008  . SHOULDER PAIN, RIGHT 04/22/2008  . NECK PAIN 10/27/2007  . REACTIVE AIRWAY DISEASE 10/01/2007  . PELVIC  PAIN 10/01/2007  . 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 POSTMENOPAUSAL STATUS 08/18/2007  . SCOLIOSIS, LUMBAR SPINE 08/18/2007  . VARICOSE VEINS, LOWER EXTREMITIES 07/28/2007  . GERD 07/28/2007  . 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 LUMBAGO 07/28/2007  . POSITIVE PPD 05/19/2007    Past Surgical History:  Procedure Laterality Date  . NECK SURGERY      OB History   No obstetric history on file.      Home Medications    Prior to Admission medications   Medication Sig Start Date End Date Taking? Authorizing Provider  benzonatate (TESSALON) 100 MG capsule Take 1 capsule (100 mg total)  by mouth every 8 (eight) hours. 03/22/20  Yes Rhys Martini, PA-C  doxycycline (VIBRAMYCIN) 100 MG capsule Take 1 capsule (100 mg total) by mouth 2 (two) times daily for 7 days. 03/22/20 03/29/20 Yes Rhys Martini, PA-C  nystatin cream (MYCOSTATIN) Apply to affected area 2 times daily 03/22/20  Yes Rhys Martini, PA-C  budesonide (PULMICORT FLEXHALER) 180  MCG/ACT inhaler Inhale 1 puff into the lungs daily. 07/15/17   Oretha Milch, MD  cetirizine (ZYRTEC) 10 MG tablet Take 1 tablet (10 mg total) by mouth daily as needed for allergies. 06/16/12   Moreno-Coll, Adlih, MD  DEXILANT 60 MG capsule TK 1 C PO QD 07/29/14   [provider]  fluticasone (FLOVENT HFA) 44 MCG/ACT inhaler Inhale 1 puff into the lungs 2 (two) times daily. 07/19/17   Michele Mcalpine, MD  lansoprazole (PREVACID) 15 MG capsule Take 1 capsule (15 mg total) by mouth daily. 04/17/17   Lars Masson, MD  meloxicam (MOBIC) 7.5 MG tablet 1-2 tabs by mouth daily with food as needed for pain. Do not take ibuprofen with this medicine. 06/24/12   Acey Lav, MD  methocarbamol (ROBAXIN) 500 MG tablet Take 1 tablet (500 mg total) by mouth 4 (four) times daily. 10/24/16   Renne Crigler, PA-C  Omega-3 Fatty Acids (FISH OIL) 1000 MG CPDR Take 2 g by mouth daily. 04/17/17   Lars Masson, MD  PROVENTIL HFA 108 [phone] Base) MCG/ACT inhaler INHALE 2 PUFFS INTO LUNGS EVERY 4 HOURS AS NEEDED FOR COUGH OR SHORTNESS OF BREATH 02/19/18   Oretha Milch, MD  Red Yeast Rice 600 MG CAPS Take 1 capsule (600 mg total) by mouth daily. 04/17/17   Lars Masson, MD    Family History No family history on file.  Social History Social History   Tobacco Use  . Smoking status: Never Smoker  . Smokeless tobacco: Never Used  Vaping Use  . Vaping Use: Never used  Substance Use Topics  . Alcohol use: No  . Drug use: No     Allergies   Patient has no known allergies.   Review of Systems Review of Systems  Constitutional: Negative for appetite change, chills and fever.  HENT: Negative for congestion, ear pain, rhinorrhea, sinus pressure, sinus pain and sore throat.   Eyes: Negative for redness and visual disturbance.  Respiratory: Positive for cough. Negative for chest tightness, shortness of breath and wheezing.   Cardiovascular: Negative for chest pain and palpitations.   Gastrointestinal: Negative for abdominal pain, constipation, diarrhea, nausea and vomiting.  Genitourinary: Negative for dysuria, frequency and urgency.  Musculoskeletal: Negative for myalgias.  Skin: Positive for rash.  Neurological: Negative for dizziness, weakness and headaches.  Psychiatric/Behavioral: Negative for confusion.  All other systems reviewed and are negative.    Physical Exam Triage Vital Signs ED Triage Vitals  Enc Vitals Group     BP 03/22/20 1908 (!) 148/104     Pulse Rate 03/22/20 1908 78     Resp 03/22/20 1908 20     Temp 03/22/20 1908 98.3 F (36.8 C)     Temp Source 03/22/20 1908 Oral     SpO2 03/22/20 1908 100 %     Weight --      Height --      Head Circumference --      Peak Flow --      Pain Score 03/22/20 1909 10     Pain Loc --  Pain Edu? --      Excl. in GC? --    No data found.  Updated Vital Signs BP (!) 148/104 (BP Location: Right Arm)   Pulse 78   Temp 98.3 F (36.8 C) (Oral)   Resp 20   SpO2 100%   Visual Acuity Right Eye Distance:   Left Eye Distance:   Bilateral Distance:    Right Eye Near:   Left Eye Near:    Bilateral Near:     Physical Exam Vitals reviewed.  Constitutional:      General: She is not in acute distress.    Appearance: Normal appearance. She is not ill-appearing.  HENT:     Head: Normocephalic and atraumatic.     Right Ear: Hearing, tympanic membrane, ear canal and external ear normal. No swelling or tenderness. There is no impacted cerumen. No mastoid tenderness. Tympanic membrane is not perforated, erythematous, retracted or bulging.     Left Ear: Hearing, tympanic membrane, ear canal and external ear normal. No swelling or tenderness. There is no impacted cerumen. No mastoid tenderness. Tympanic membrane is not perforated, erythematous, retracted or bulging.     Nose:     Right Sinus: No maxillary sinus tenderness or frontal sinus tenderness.     Left Sinus: No maxillary sinus tenderness or  frontal sinus tenderness.     Mouth/Throat:     Mouth: Mucous membranes are moist.     Pharynx: Uvula midline. No oropharyngeal exudate or posterior oropharyngeal erythema.     Tonsils: No tonsillar exudate.  Cardiovascular:     Rate and Rhythm: Normal rate and regular rhythm.     Heart sounds: Normal heart sounds.  Pulmonary:     Breath sounds: Normal breath sounds and air entry. No wheezing, rhonchi or rales.  Chest:     Chest wall: No tenderness.  Abdominal:     General: Abdomen is flat. Bowel sounds are normal.     Tenderness: There is no abdominal tenderness. There is no guarding or rebound.  Lymphadenopathy:     Cervical: No cervical adenopathy.  Skin:    Comments: Beefy red rash under L breast  extending to L side. TTP, warm to touch. No discharge. Scant rash on posterior L shoulder, also beefy red and tender. No other lesions or rashes.   Neurological:     General: No focal deficit present.     Mental Status: She is alert.  Psychiatric:        Attention and Perception: Attention and perception normal.        Mood and Affect: Affect normal. Mood is anxious.        Behavior: Behavior normal. Behavior is cooperative.        Thought Content: Thought content normal.        Cognition and Memory: Cognition is impaired. Memory is impaired. She exhibits impaired recent memory.        Judgment: Judgment normal.     Comments: Patient demonstrates very poor short-term recall throughout this visit.      UC Treatments / Results  Labs (all labs ordered are listed, but only abnormal results are displayed) Labs Reviewed  SARS CORONAVIRUS 2 (TAT 6-24 HRS)    EKG   Radiology No results found.  Procedures Procedures (including critical care time)  Medications Ordered in UC Medications - No data to display  Initial Impression / Assessment and Plan / UC Course  I have reviewed the triage vital signs and the nursing notes.  Pertinent labs & imaging results that were available  during my care of the patient were reviewed by me and considered in my medical decision making (see chart for details).     This patient is a 65 year old female presenting with rash and cough. Today she is  afebrile nontachycardic nontachypneic, oxygenating well on room air, no wheezes  Rhonchi or rales.   For rash, plan to treat for candida dermatitis with overlying celluilitis with doxycycline and nystatin ointment as below.   For cough, tessalon sent. covid sent. Isolation as per CDC guidelines.   For asthma, continue inhalers as directed.  This patient demonstrates very poor short-term recall throughout this visit. She has a diagnosis of alzheimers. rec close f/u with PCP due to this. She is here today with family member.   Strict return precautions for this patinet. Worsening of rash despite treatment, fevers/chills, chest pain, shortness of breath- head straight to ED. They verbalize understanding and agreement.   Using translator, spent over 60 minutes obtaining H&P, performing physical, discussing results, treatment plan and plan for follow-up with patient. Answered patient and caregiver's many questions. They agree with plan.    Final Clinical Impressions(s) / UC Diagnoses   Final diagnoses:  Candidal dermatitis  Cellulitis of back except buttock  Viral URI with cough  Encounter for screening for COVID-19  Alzheimer's disease (HCC)  Moderate persistent asthma without complication     Discharge Instructions     -For your rash, please start the nystatin cream. Apply this 1-2x daily to your rash for at least 7 days.  -Also start the antibiotic. This is doxycycline, twice daily for 7 days.  -For your cough, start the medication- tessalon. Take up to 3 pills daily as needed.  -We've send a covid test. This will come back tomorrow  -If your symptoms worsen or persist, head straight to the ED.  -Follow-up if your cough and rash persists despite treatment. You can follow-up  with [place] or your PCP.    ED Prescriptions    Medication Sig Dispense Auth. Provider   nystatin cream (MYCOSTATIN) Apply to affected area 2 times daily 50 g Rhys Martini, PA-C   doxycycline (VIBRAMYCIN) 100 MG capsule Take 1 capsule (100 mg total) by mouth 2 (two) times daily for 7 days. 14 capsule Rhys Martini, PA-C   benzonatate (TESSALON) 100 MG capsule Take 1 capsule (100 mg total) by mouth every 8 (eight) hours. 21 capsule Rhys Martini, PA-C     PDMP not reviewed this encounter.   Rhys Martini, PA-C 03/22/20 2050    Rhys Martini, PA-C 03/23/20 0700

## 2020-03-22 NOTE — Discharge Instructions (Addendum)
-  For your rash, please start the nystatin cream. Apply this 1-2x daily to your rash for at least 7 days.  -Also start the antibiotic. This is doxycycline, twice daily for 7 days.  -For your cough, start the medication- tessalon. Take up to 3 pills daily as needed.  -We've send a covid test. This will come back tomorrow  -If your symptoms worsen or persist, head straight to the ED.  -Follow-up if your cough and rash persists despite treatment. You can follow-up with [place] or your PCP.

## 2020-03-22 NOTE — ED Notes (Signed)
Patient declined offer for Covid testing. 

## 2022-02-28 IMAGING — MR MR THORACIC SPINE WO/W CM
5 of 10 series · 25 of 48 positions shown · IV contrast (multihance)
Comparison: None.

CLINICAL DATA: Chronic diffuse spine pain extending into all
extremities with numbness in the legs and feet.

EXAM:
MRI THORACIC WITHOUT AND WITH CONTRAST
TECHNIQUE: Multiplanar and multiecho pulse sequences of the thoracic spine were
obtained without and with intravenous contrast.
CONTRAST:  13mL MULTIHANCE GADOBENATE DIMEGLUMINE 529 MG/ML IV SOLN

[Series 6: T1 · sagittal · 3.0mm · 0.83mm/px · 4 of 17 slices shown (1 of 2)]
[im 1/17]
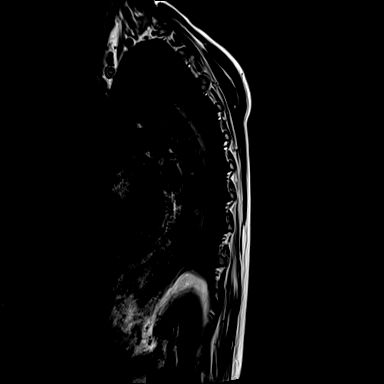
[im 6/17]
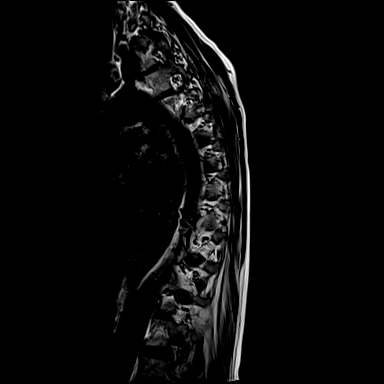
[im 11/17]
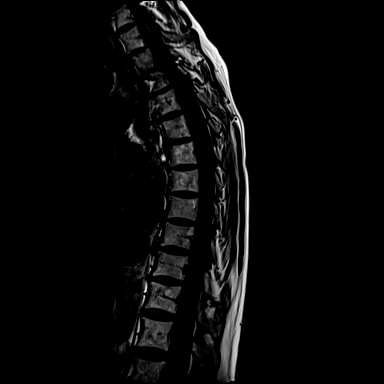
[im 17/17]
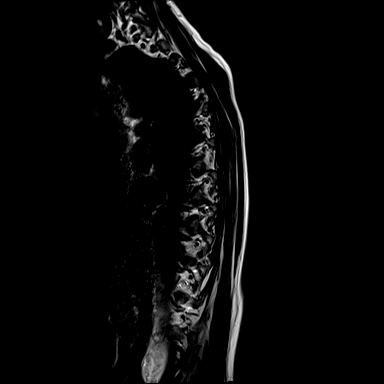

[Series 8: T2 · sagittal · 3.0mm · 0.83mm/px · 3 of 17 slices shown (1 of 3)]
[im 1/17]
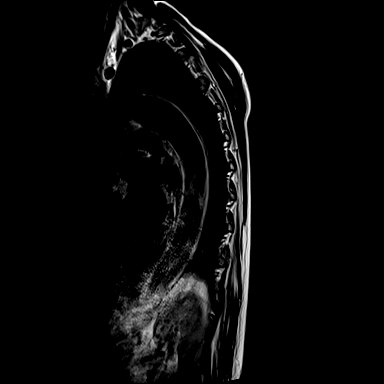
[im 9/17]
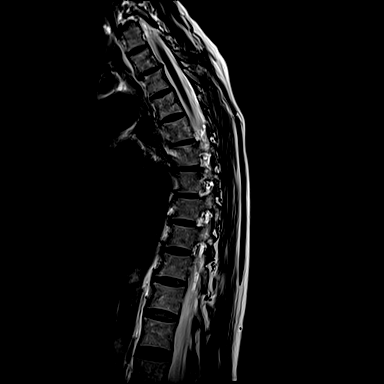
[im 17/17]
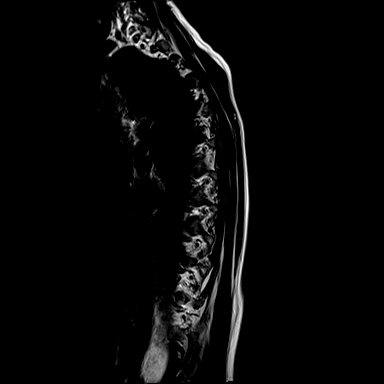

[Series 9: T2 · axial · 4.0mm · 0.28mm/px · z∈[-286,-58]mm · 7 of 39 slices shown (2 of 3)]
[im 1/39]
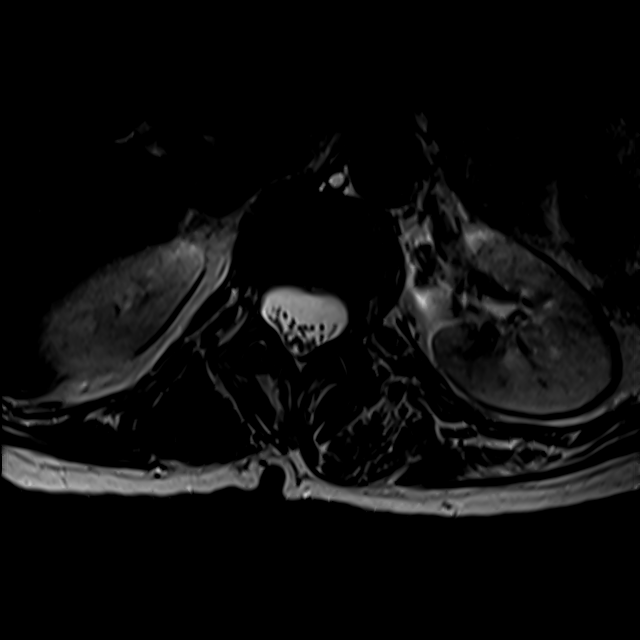
[im 7/39]
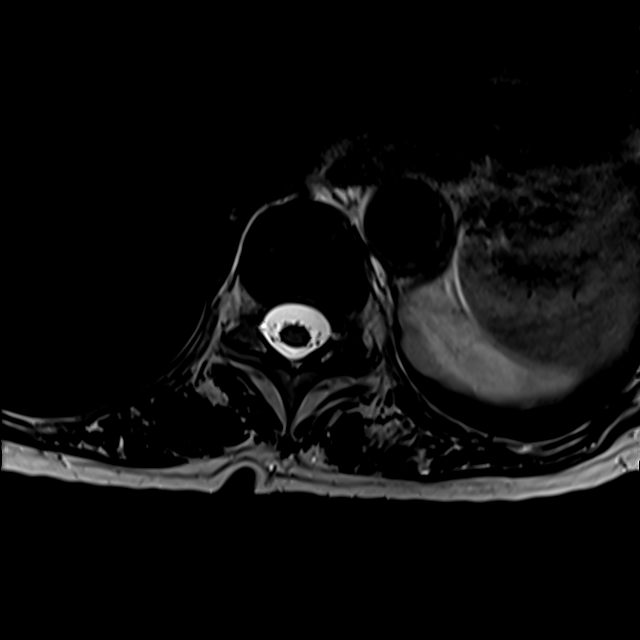
[im 13/39]
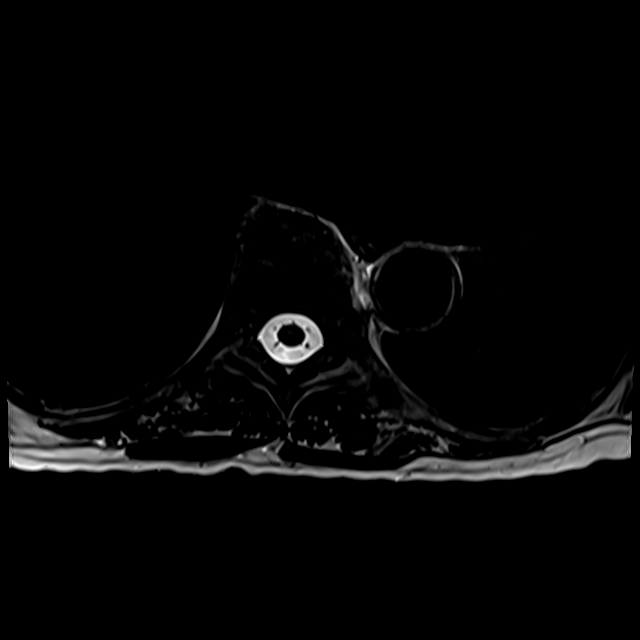
[im 20/39]
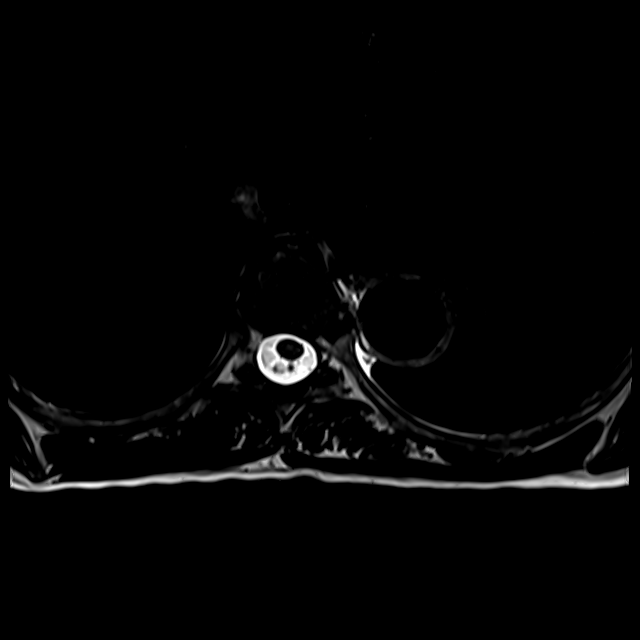
[im 26/39]
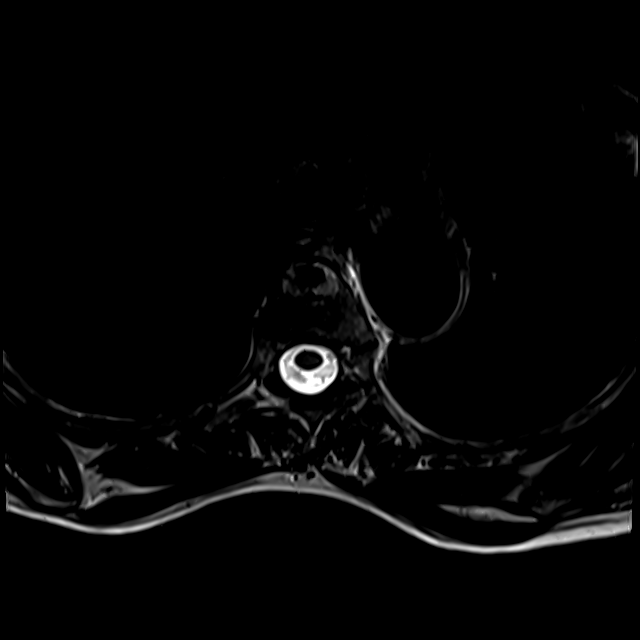
[im 32/39]
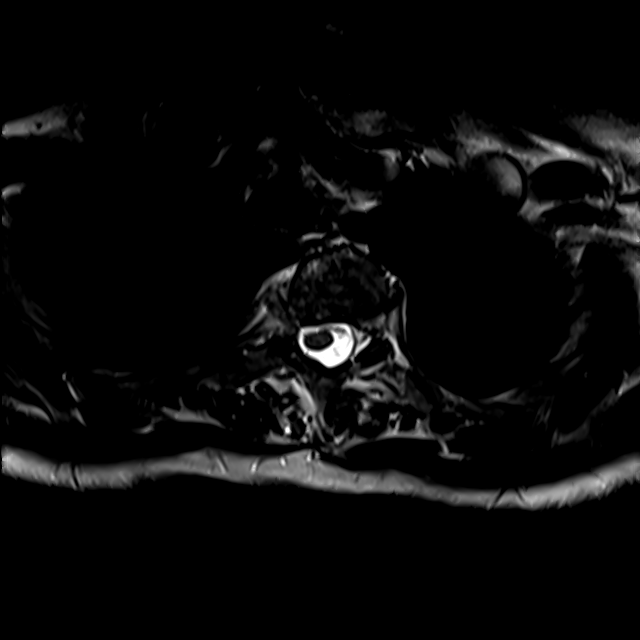
[im 39/39]
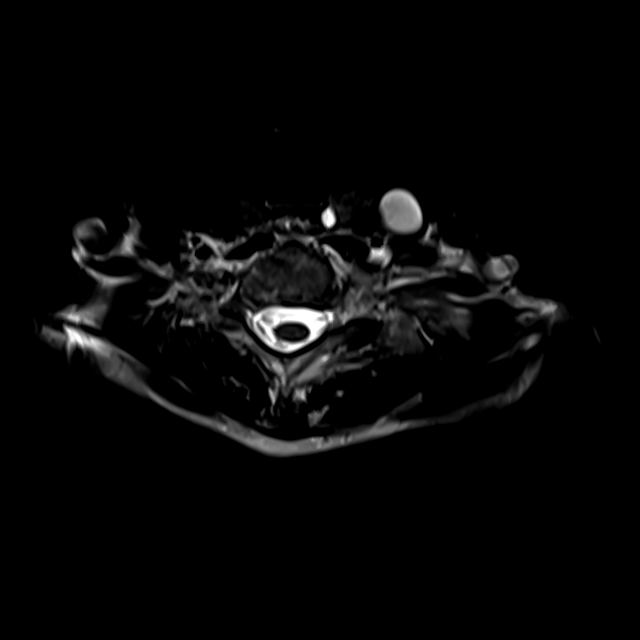

[Series 11: T1 · axial · non-contrast · 4.0mm · 0.56mm/px · z∈[-286,-58]mm · 7 of 39 slices shown (2 of 2)]
[im 1/39]
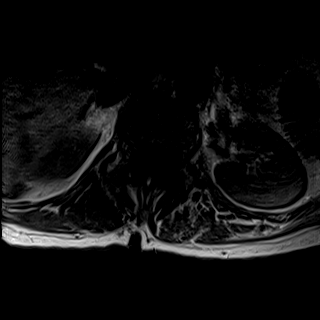
[im 7/39]
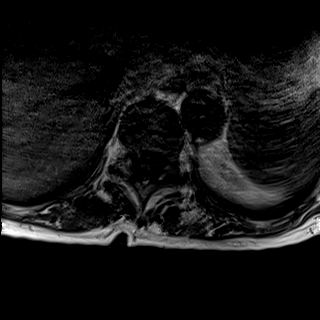
[im 13/39]
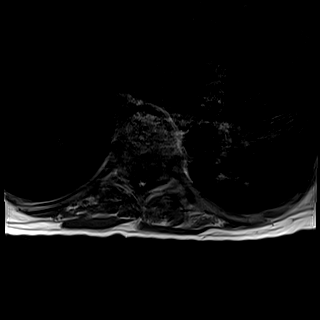
[im 20/39]
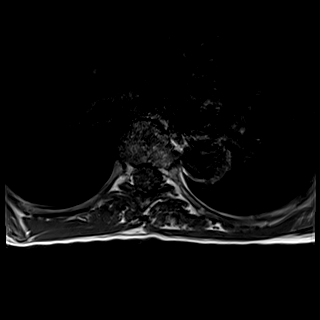
[im 26/39]
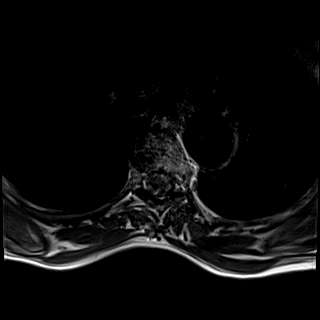
[im 32/39]
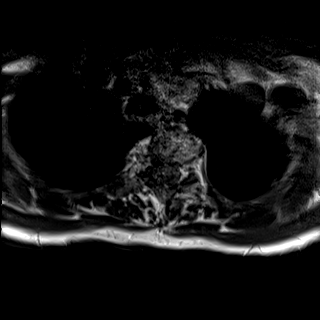
[im 39/39]
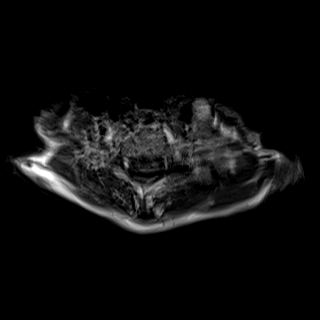

[Series 22: T2 · sagittal · 3.0mm · 0.83mm/px · 4 of 19 slices shown (3 of 3)]
[im 1/19]
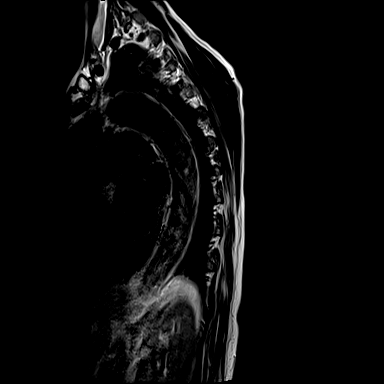
[im 7/19]
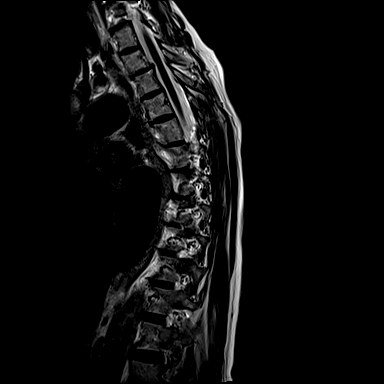
[im 13/19]
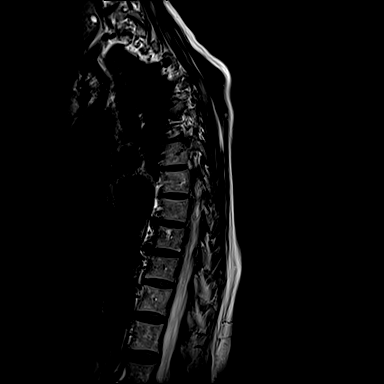
[im 19/19]
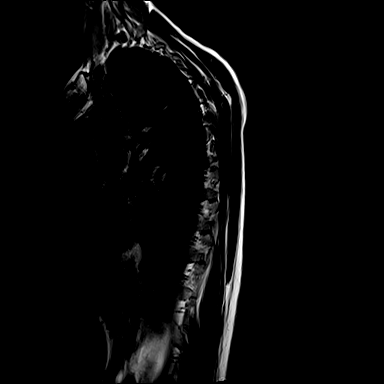

[25 of 48 positions shown; findings below may reference images not displayed]

FINDINGS: The study is motion degraded including severe motion on the pre and
postcontrast axial T1 images.

Alignment: Mild upper thoracic levoscoliosis. Trace anterolisthesis
of T2 on T3.

Vertebrae: No fracture or suspicious marrow lesion. Mild
degenerative endplate edema at T2-3 and T5-6.

Cord:  Normal signal and morphology.

Paraspinal and other soft tissues: Unremarkable.

Disc levels: At T2-3, there is asymmetrically advanced disc
degeneration on the right with disc bulging, a right paracentral to
foraminal disc osteophyte complex, and mild right facet arthrosis
resulting in mild to moderate right neural foraminal stenosis. There
is milder asymmetric right-sided disc degeneration right facet
arthrosis at T3-4 without stenosis. No significant disc degeneration
is present more inferiorly in the thoracic spine.
IMPRESSION: 1. Asymmetric right-sided disc degeneration at T2-3 with
mild-to-moderate right neural foraminal stenosis.
2. No spinal stenosis.

## 2022-08-06 ENCOUNTER — Other Ambulatory Visit: Payer: Self-pay | Admitting: Endocrinology

## 2022-08-06 DIAGNOSIS — Z1231 Encounter for screening mammogram for malignant neoplasm of breast: Secondary | ICD-10-CM

## 2022-08-07 ENCOUNTER — Ambulatory Visit
Admission: RE | Admit: 2022-08-07 | Discharge: 2022-08-07 | Disposition: A | Discharge status: Home / Self Care | Payer: Medicare Other | Source: Ambulatory Visit | Attending: Endocrinology | Admitting: Endocrinology

## 2022-08-07 DIAGNOSIS — Z1231 Encounter for screening mammogram for malignant neoplasm of breast: Secondary | ICD-10-CM

## 2022-08-10 ENCOUNTER — Ambulatory Visit: Payer: Medicare Other
